# Patient Record
Sex: Female | Born: 1970 | Race: White | Hispanic: No | Marital: Married | Smoking: Former smoker
Health system: Southern US, Community
[De-identification: ages and names within clinical notes are randomized; demographics above are authoritative.]

## PROBLEM LIST (undated history)

## (undated) DIAGNOSIS — R471 Dysarthria and anarthria: Secondary | ICD-10-CM

## (undated) DIAGNOSIS — G8194 Hemiplegia, unspecified affecting left nondominant side: Secondary | ICD-10-CM

## (undated) DIAGNOSIS — F419 Anxiety disorder, unspecified: Secondary | ICD-10-CM

## (undated) DIAGNOSIS — J45909 Unspecified asthma, uncomplicated: Secondary | ICD-10-CM

## (undated) DIAGNOSIS — F32A Depression, unspecified: Secondary | ICD-10-CM

## (undated) DIAGNOSIS — E7800 Pure hypercholesterolemia, unspecified: Secondary | ICD-10-CM

## (undated) DIAGNOSIS — Z8673 Personal history of transient ischemic attack (TIA), and cerebral infarction without residual deficits: Secondary | ICD-10-CM

## (undated) DIAGNOSIS — G43909 Migraine, unspecified, not intractable, without status migrainosus: Secondary | ICD-10-CM

## (undated) DIAGNOSIS — F329 Major depressive disorder, single episode, unspecified: Secondary | ICD-10-CM

## (undated) DIAGNOSIS — I773 Arterial fibromuscular dysplasia: Secondary | ICD-10-CM

## (undated) DIAGNOSIS — R079 Chest pain, unspecified: Secondary | ICD-10-CM

## (undated) DIAGNOSIS — R739 Hyperglycemia, unspecified: Secondary | ICD-10-CM

## (undated) DIAGNOSIS — M2062 Acquired deformities of toe(s), unspecified, left foot: Secondary | ICD-10-CM

## (undated) HISTORY — DX: Major depressive disorder, single episode, unspecified: F32.9

## (undated) HISTORY — DX: Anxiety disorder, unspecified: F41.9

## (undated) HISTORY — DX: Migraine, unspecified, not intractable, without status migrainosus: G43.909

## (undated) HISTORY — DX: Pure hypercholesterolemia, unspecified: E78.00

## (undated) HISTORY — DX: Dysarthria and anarthria: R47.1

## (undated) HISTORY — DX: Depression, unspecified: F32.A

## (undated) HISTORY — PX: VAGINAL HYSTERECTOMY: SUR661

## (undated) HISTORY — PX: VENA CAVA FILTER PLACEMENT: SUR1032

## (undated) HISTORY — DX: Personal history of transient ischemic attack (TIA), and cerebral infarction without residual deficits: Z86.73

## (undated) HISTORY — PX: BREAST ENHANCEMENT SURGERY: SHX7

## (undated) HISTORY — PX: APPENDECTOMY: SHX54

---

## 2012-11-13 LAB — HM MAMMOGRAPHY: HM Mammogram: NORMAL

## 2012-11-13 LAB — HM DEXA SCAN

## 2013-02-21 LAB — HM PAP SMEAR: HM Pap smear: NORMAL

## 2013-10-09 ENCOUNTER — Ambulatory Visit: Payer: Medicaid - Out of State | Admitting: Neurology

## 2013-10-25 ENCOUNTER — Encounter: Payer: Self-pay | Admitting: Neurology

## 2013-10-29 ENCOUNTER — Encounter: Payer: Self-pay | Admitting: Neurology

## 2013-10-29 ENCOUNTER — Ambulatory Visit (INDEPENDENT_AMBULATORY_CARE_PROVIDER_SITE_OTHER): Payer: Medicare Other | Admitting: Neurology

## 2013-10-29 VITALS — BP 108/66 | HR 98 | Ht 61.0 in | Wt 170.0 lb

## 2013-10-29 DIAGNOSIS — F419 Anxiety disorder, unspecified: Secondary | ICD-10-CM

## 2013-10-29 DIAGNOSIS — E7800 Pure hypercholesterolemia, unspecified: Secondary | ICD-10-CM

## 2013-10-29 DIAGNOSIS — G8114 Spastic hemiplegia affecting left nondominant side: Secondary | ICD-10-CM | POA: Insufficient documentation

## 2013-10-29 DIAGNOSIS — I773 Arterial fibromuscular dysplasia: Secondary | ICD-10-CM

## 2013-10-29 DIAGNOSIS — I639 Cerebral infarction, unspecified: Secondary | ICD-10-CM

## 2013-10-29 DIAGNOSIS — G8110 Spastic hemiplegia affecting unspecified side: Secondary | ICD-10-CM

## 2013-10-29 DIAGNOSIS — F329 Major depressive disorder, single episode, unspecified: Secondary | ICD-10-CM | POA: Insufficient documentation

## 2013-10-29 DIAGNOSIS — G43909 Migraine, unspecified, not intractable, without status migrainosus: Secondary | ICD-10-CM | POA: Insufficient documentation

## 2013-10-29 DIAGNOSIS — I6350 Cerebral infarction due to unspecified occlusion or stenosis of unspecified cerebral artery: Secondary | ICD-10-CM

## 2013-10-29 DIAGNOSIS — I7789 Other specified disorders of arteries and arterioles: Secondary | ICD-10-CM

## 2013-10-29 DIAGNOSIS — R471 Dysarthria and anarthria: Secondary | ICD-10-CM | POA: Insufficient documentation

## 2013-10-29 DIAGNOSIS — F32A Depression, unspecified: Secondary | ICD-10-CM

## 2013-10-29 NOTE — Progress Notes (Signed)
PATIENT: Courtney Garza DOB: 1970-11-07  HISTORICAL  Courtney Garza is a 43 years old right-handed female, referred by her primary care physician Dr. Neena Rhymes for evaluation of EMG guided Botox injection for her spastic left upper and lower extremity.  She had a history of fibromuscular dystrophy, presented with right carotid artery dissection 2 years ago in 2013,, suffered stroke, with left hemiparesis, she also reported a history of right vertebral artery dissection, iliac artery dissection, she was treated by [place] neurologist [facility], she has been receiving EMG guided Botox every 3 months by Dr. Vito Berger, last injection was Jul 05 2011, received a total of 600 Botox a to her left upper extremity, and 800 units of Botox a to left lower extremity.  Per patient, Botox injection has been very helpful, less spastic, she has no use of her left hand, ambulate with a cane  He recently moved to [name] August 2015, also agreed to establish her regular EMG guided Botox injection.     REVIEW OF SYSTEMS: Full 14 system review of systems performed and notable only for gait difficulty ALLERGIES: Allergies  Allergen Reactions  . Acetaminophen Hives  . Hydromorphone Other (See Comments)    Could not speak or keep her eyes open  . Migraine Formula  [Aspirin-Acetaminophen-Caffeine] Hives    pt is able to eat shellfish without a problem.   . Ondansetron Nausea And Vomiting  . Duloxetine Hcl Rash    hallucinations    HOME MEDICATIONS: Current Outpatient Prescriptions on File Prior to Visit  Medication Sig Dispense Refill  . albuterol (PROVENTIL HFA;VENTOLIN HFA) 108 (90 BASE) MCG/ACT inhaler Inhale into the lungs every 6 (six) hours as needed for wheezing or shortness of breath.      Marland Kitchen amitriptyline (ELAVIL) 10 MG tablet Take 10 mg by mouth 2 (two) times daily.      Marland Kitchen aspirin 325 MG tablet Take 325 mg by mouth daily.      Marland Kitchen omeprazole (PRILOSEC) 20 MG capsule Take 20 mg by mouth  daily.      . promethazine (PHENERGAN) 25 MG tablet Take 25 mg by mouth every 6 (six) hours as needed for nausea or vomiting.       No current facility-administered medications on file prior to visit.    PAST MEDICAL HISTORY: Past Medical History  Diagnosis Date  . Migraine   . High cholesterol   . Depression   . Anxiety   . Dysarthria     Left arm    PAST SURGICAL HISTORY: Past Surgical History  Procedure Laterality Date  . Vaginal hysterectomy    . Appendectomy    . Breast enhancement surgery      FAMILY HISTORY: Family History  Problem Relation Age of Onset  . Cancer Mother     SOCIAL HISTORY:  History   Social History  . Marital Status: Married    Spouse Name: [name]     Number of Children: 3  . Years of Education: 10 th   Occupational History  .      Disabled   Social History Main Topics  . Smoking status: Former Games developer  . Smokeless tobacco: Never Used     Comment: Quit 2013  . Alcohol Use: No  . Drug Use: No  . Sexual Activity: Not on file   Other Topics Concern  . Not on file   Social History Narrative   Patient lives at home with her husband [name].   Disabled.   Education 10  th   Right handed.   Caffeine six cup of coffee daily.       PHYSICAL EXAM   Filed Vitals:   10/29/13 0908  BP: 108/66  Pulse: 98  Height:  (1.549 m)  Weight: 170 lb (77.111 kg)    Not recorded    Body mass index is 32.14 kg/(m^2).   Generalized: In no acute distress  Neck: Supple, no carotid bruits   Cardiac: Regular rate rhythm  Pulmonary: Clear to auscultation bilaterally  Musculoskeletal: No deformity  Neurological examination  Mentation: Alert oriented to time, place, history taking, and causual conversation  Cranial nerve II-XII: Pupils were equal round reactive to light. Extraocular movements were full.  Visual field were full on confrontational test. Bilateral fundi were sharp.  Facial sensation and strength were normal. Hearing was  intact to finger rubbing bilaterally. Uvula tongue midline.  Head turning and shoulder shrug and were normal and symmetric.Tongue protrusion into cheek strength was normal.  Motor: spastic left hemiparesis, no movement of left arm, left shoulder anterior rotation, adduction, mild left elbow flexion, pronation, left wrist flexion, thumb inin position, left hip flexion 4, knee flexion 4, extension 4 plus, ankle dorsiflexion 3, eversion 3, in portion 3, plantar flexion 4  Sensory: Intact to fine touch, pinprick, preserved vibratory sensation, and proprioception at toes.  Coordination: Normal finger to nose, heel-to-shin bilaterally there was no truncal ataxia  Gait: Rising up from seated position pushing on a chair arm, left foot drop, left  circumferential gait  Deep tendon reflexes: Brachioradialis 2/2, biceps 2/2, triceps 2/2, patellar 2/3, Achilles 2/2, left plantar responses was extensor, right side was flexor.  DIAGNOSTIC DATA (LABS, IMAGING, TESTING) - I reviewed patient records, labs, notes, testing and imaging myself where available.   ASSESSMENT AND PLAN  Ardena Gangl is a 43 y.o. female with past medical history of right hemisphere stroke in 2013 due to fibromuscular dysplasia, carotid artery dissection, with residual spastic left hemiparesis, she has been receiving EMG guided botulism toxin injection to her spastic left upper, and lower extremity, which did help her spasticity, last injection was May 20/15, large dose up to 1400 units in one injection   1. Preauthorization for Xeomin 400 2. RTC for EMG guided injection after preauthorization   Levert Feinstein, M.D. Ph.D.  [facility] [address] [place], [place] [address] [phone]

## 2013-10-30 LAB — VITAMIN B12: Vitamin B-12: 350 pg/mL (ref 211–946)

## 2013-10-30 LAB — TSH: TSH: 1.97 u[IU]/mL (ref 0.450–4.500)

## 2013-10-30 LAB — RPR: RPR: NONREACTIVE

## 2013-10-30 LAB — C-REACTIVE PROTEIN: CRP: 5.2 mg/L — ABNORMAL HIGH (ref 0.0–4.9)

## 2013-10-30 LAB — HOMOCYSTEINE: Homocysteine: 9.4 umol/L (ref 0.0–15.0)

## 2013-10-30 LAB — FOLATE: Folate: 9.2 ng/mL (ref >3.0)

## 2013-10-30 LAB — SEDIMENTATION RATE: Sed Rate: 7 mm/hr (ref 0–32)

## 2013-11-13 ENCOUNTER — Ambulatory Visit (INDEPENDENT_AMBULATORY_CARE_PROVIDER_SITE_OTHER): Payer: Medicare Other | Admitting: Family Medicine

## 2013-11-13 ENCOUNTER — Encounter: Payer: Self-pay | Admitting: Family Medicine

## 2013-11-13 VITALS — BP 110/80 | HR 111 | Temp 98.0°F | Resp 16 | Ht 62.0 in | Wt 171.5 lb

## 2013-11-13 DIAGNOSIS — G8114 Spastic hemiplegia affecting left nondominant side: Secondary | ICD-10-CM

## 2013-11-13 DIAGNOSIS — Z23 Encounter for immunization: Secondary | ICD-10-CM

## 2013-11-13 DIAGNOSIS — E7800 Pure hypercholesterolemia, unspecified: Secondary | ICD-10-CM

## 2013-11-13 DIAGNOSIS — F419 Anxiety disorder, unspecified: Secondary | ICD-10-CM

## 2013-11-13 DIAGNOSIS — R1012 Left upper quadrant pain: Secondary | ICD-10-CM

## 2013-11-13 DIAGNOSIS — G8110 Spastic hemiplegia affecting unspecified side: Secondary | ICD-10-CM

## 2013-11-13 DIAGNOSIS — I773 Arterial fibromuscular dysplasia: Secondary | ICD-10-CM

## 2013-11-13 DIAGNOSIS — J45909 Unspecified asthma, uncomplicated: Secondary | ICD-10-CM

## 2013-11-13 DIAGNOSIS — I7789 Other specified disorders of arteries and arterioles: Secondary | ICD-10-CM

## 2013-11-13 DIAGNOSIS — F411 Generalized anxiety disorder: Secondary | ICD-10-CM

## 2013-11-13 DIAGNOSIS — J4530 Mild persistent asthma, uncomplicated: Secondary | ICD-10-CM

## 2013-11-13 MED ORDER — PROMETHAZINE HCL 25 MG PO TABS: 25.0000 mg | ORAL_TABLET | Freq: Four times a day (QID) | ORAL | Status: DC | PRN

## 2013-11-13 MED ORDER — ALBUTEROL SULFATE HFA 108 (90 BASE) MCG/ACT IN AERS: 2.0000 | INHALATION_SPRAY | Freq: Four times a day (QID) | RESPIRATORY_TRACT | Status: DC | PRN

## 2013-11-13 MED ORDER — VENLAFAXINE HCL ER 150 MG PO CP24: 150.0000 mg | ORAL_CAPSULE | Freq: Every day | ORAL | Status: DC

## 2013-11-13 MED ORDER — DICYCLOMINE HCL 20 MG PO TABS: 20.0000 mg | ORAL_TABLET | Freq: Four times a day (QID) | ORAL | Status: DC

## 2013-11-13 NOTE — Progress Notes (Signed)
   Subjective:    Patient ID: Courtney Garza, female    DOB: November 17, 1970, 43 y.o.   MRN: [id number]  HPI New to establish.  Previous MD- [facility]  Neuro- [facility] Arabia  L hemiparesis- s/p CVA 11/01/10 due to dissection of R carotid along w/ dissections of L vertebral, celiac, iliac due to fibromuscular dysplasia.  Pt was previously seeing vascular specialist.  Pt plans to follow at [facility] w/ Dr Lollie Sails.  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  Hyperlipidemia- chronic problem, on Lipitor.  Total cholesterol 154, LDL 70, Trigs 137 on 08/21/13.  Hx of chronic LUQ abd pain- has had negative gastric emptying study, normal GYN exam.  Pain is worse after eating, particularly red meat.  Anxiety- chronic problem, well controlled on Effexor.  Husband reports pt will 'space' particularly in the evening- 'it's like my thoughts are spinning'.  Quick to frustrate and anger.  Increased agitation.  Asthma- 'as long as i can remember'.  Previously on Flovent.  Pt reports requiring inhaler 'a couple times a month'.   Review of Systems For ROS see HPI     Objective:   Physical Exam  Vitals reviewed. Constitutional: She is oriented to person, place, and time. She appears well-developed and well-nourished. No distress.  HENT:  Head: Normocephalic and atraumatic.  Eyes: Conjunctivae and EOM are normal. Pupils are equal, round, and reactive to light.  Neck: Normal range of motion. Neck supple. No thyromegaly present.  Cardiovascular: Normal rate, regular rhythm, normal heart sounds and intact distal pulses.   No murmur heard. Pulmonary/Chest: Effort normal and breath sounds normal. No respiratory distress.  Abdominal: Soft. She exhibits no distension. There is no tenderness.  Musculoskeletal: She exhibits no edema.  Lymphadenopathy:    She has no cervical adenopathy.  Neurological: She is alert and oriented to person, place, and time. Coordination abnormal.  Pt w/ L spastic hemiplegia    Skin: Skin is warm and dry.  Psychiatric: She has a normal mood and affect. Her behavior is normal.          Assessment & Plan:

## 2013-11-13 NOTE — Patient Instructions (Signed)
Follow up in 6 months to recheck cholesterol We'll call you with your vascular and GI referrals Increase the Effexor to  daily- 2 of what you have, 1 of the new prescription Start the Bentyl as needed for abdominal pain Call with any questions or concerns Welcome!  We're glad to have you!!

## 2013-11-13 NOTE — Progress Notes (Signed)
Pre visit review using our clinic review tool, if applicable. No additional management support is needed unless otherwise documented below in the visit note. 

## 2013-11-14 DIAGNOSIS — Z23 Encounter for immunization: Secondary | ICD-10-CM

## 2013-11-17 NOTE — Assessment & Plan Note (Signed)
New to provider.  S/p CVA.  Following w/ Neuro.  Will follow along.

## 2013-11-17 NOTE — Assessment & Plan Note (Signed)
New to provider.  Ongoing for pt.  Reports she has had extensive GI workup but no cause of pain identified.  May be related to pt's vascular issues putting pressure on nerve.  Will refer to GI specialist at [facility].  Pt expressed understanding and is in agreement w/ plan.

## 2013-11-17 NOTE — Assessment & Plan Note (Signed)
Chronic problem.  Tolerating statin w/o difficulty.  Reviewed recent labs.  No med changes at this time 

## 2013-11-17 NOTE — Assessment & Plan Note (Signed)
New to provider, ongoing for pt.  Will increased Effexor for better control of sxs as both pt and husband agree that this is still large problem for pt.  Will monitor for improvement.

## 2013-11-17 NOTE — Assessment & Plan Note (Signed)
New to provider, ongoing for pt.  Pt reports she has not required controller inhaler and only uses albuterol 'a couple of times a month'.  Will continue to follow and start controller med prn.

## 2013-11-17 NOTE — Assessment & Plan Note (Signed)
New to provider, ongoing for pt.  This has caused multiple dissections and resulted in pt's CVA.  Pt was recommended to f/u w/ specialist at [facility].  Referral entered.

## 2013-11-19 ENCOUNTER — Encounter: Payer: Self-pay | Admitting: Family Medicine

## 2013-11-19 MED ORDER — ATORVASTATIN CALCIUM 20 MG PO TABS: 20.0000 mg | ORAL_TABLET | Freq: Every day | ORAL | Status: DC

## 2013-11-19 NOTE — Telephone Encounter (Signed)
Med filled.  

## 2013-11-27 ENCOUNTER — Encounter: Payer: Self-pay | Admitting: Neurology

## 2013-11-27 ENCOUNTER — Ambulatory Visit (INDEPENDENT_AMBULATORY_CARE_PROVIDER_SITE_OTHER): Payer: Medicare Other | Admitting: Neurology

## 2013-11-27 DIAGNOSIS — G8114 Spastic hemiplegia affecting left nondominant side: Secondary | ICD-10-CM

## 2013-11-27 DIAGNOSIS — G43011 Migraine without aura, intractable, with status migrainosus: Secondary | ICD-10-CM

## 2013-11-27 MED ORDER — INCOBOTULINUMTOXINA 100 UNITS IM SOLR
400.0000 [IU] | Freq: Once | INTRAMUSCULAR | Status: AC
  Administered 2013-11-27: 400 [IU] via INTRAMUSCULAR

## 2013-11-27 NOTE — Progress Notes (Signed)
PATIENT: Courtney Garza DOB: 01/25/1971  HISTORICAL  Courtney Garza is a 43 years old right-handed female, referred by her primary care physician Dr. Neena Rhymes for evaluation of EMG guided Botox injection for her spastic left upper and lower extremity.  She had a history of fibromuscular dystrophy, presented with right carotid artery dissection 2 years ago in 2013,, suffered stroke, with left hemiparesis, she also reported a history of left vertebral artery dissection, iliac artery dissection, she was treated by [place] neurologist [facility], she has been receiving EMG guided Botox every 3 months by Dr. Vito Berger, last injection was Jul 05 2011, received a total of 600 Botox a to her left upper extremity, and 800 units of Botox a to left lower extremity.  Per patient, Botox injection has been very helpful, less spastic, she has no use of her left hand, ambulate with a cane  He recently moved to [facility] August 2015, also agreed to establish her regular EMG guided Botox injection.    UPDATE Oct 7th 2015  Patient came in for EMG guided xeomin injections her spastic left hemiparesis,  She also reported long-standing history of migraine, getting worse over the past few years, almost daily basis, her headache I usually right retro-orbital area severe pounding headaches, with associated light noise sensitivity, lasting for a few hours, previously she was also receiving Botox injection for migraine prevention, which has worked well for her  REVIEW OF SYSTEMS: Full 14 system review of systems performed and notable only for gait difficulty ALLERGIES: Allergies  Allergen Reactions  . Acetaminophen Hives  . Hydromorphone Other (See Comments)    Could not speak or keep her eyes open  . Migraine Formula  [Aspirin-Acetaminophen-Caffeine] Hives    pt is able to eat shellfish without a problem.   . Ondansetron Nausea And Vomiting  . Shellfish-Derived Products   . Duloxetine Hcl Rash   hallucinations    HOME MEDICATIONS: Current Outpatient Prescriptions on File Prior to Visit  Medication Sig Dispense Refill  . albuterol (PROVENTIL HFA;VENTOLIN HFA) 108 (90 BASE) MCG/ACT inhaler Inhale 2 puffs into the lungs every 6 (six) hours as needed for wheezing or shortness of breath.  1 Inhaler  6  . amitriptyline (ELAVIL) 10 MG tablet Take 10 mg by mouth 2 (two) times daily.      . Ascorbic Acid (VITAMIN C) 1000 MG tablet Take 1,000 mg by mouth daily.      Marland Kitchen aspirin 325 MG tablet Take 325 mg by mouth daily.      Marland Kitchen atorvastatin (LIPITOR) 20 MG tablet Take 1 tablet (20 mg total) by mouth daily.  30 tablet  6  . baclofen (LIORESAL) 10 MG tablet Take 10 mg by mouth 3 (three) times daily.      . cyproheptadine (PERIACTIN) 4 MG tablet Take 4 mg by mouth 3 (three) times daily as needed for allergies.      Marland Kitchen dicyclomine (BENTYL) 20 MG tablet Take 1 tablet (20 mg total) by mouth every 6 (six) hours.  60 tablet  3  . gabapentin (NEURONTIN) 300 MG capsule Take 300 mg by mouth 3 (three) times daily.      Marland Kitchen gabapentin (NEURONTIN) 600 MG tablet Take 600 mg by mouth 3 (three) times daily.      . Multiple Vitamins-Minerals (MULTIVITAMIN & MINERAL PO) Take by mouth daily.      Marland Kitchen omeprazole (PRILOSEC) 20 MG capsule Take 20 mg by mouth daily.      . promethazine (PHENERGAN) 25 MG  tablet Take 1 tablet (25 mg total) by mouth every 6 (six) hours as needed for nausea or vomiting.  30 tablet  3  . venlafaxine XR (EFFEXOR-XR) 150 MG 24 hr capsule Take 1 capsule (150 mg total) by mouth daily with breakfast.  30 capsule  6   No current facility-administered medications on file prior to visit.    PAST MEDICAL HISTORY: Past Medical History  Diagnosis Date  . Migraine   . High cholesterol   . Depression   . Anxiety   . Dysarthria     Left arm    PAST SURGICAL HISTORY: Past Surgical History  Procedure Laterality Date  . Vaginal hysterectomy    . Appendectomy    . Breast enhancement surgery       FAMILY HISTORY: Family History  Problem Relation Age of Onset  . Cancer Mother     SOCIAL HISTORY:  History   Social History  . Marital Status: Married    Spouse Name: [name]     Number of Children: 3  . Years of Education: 10 th   Occupational History  .      Disabled   Social History Main Topics  . Smoking status: Former Games developer  . Smokeless tobacco: Never Used     Comment: Quit 2013  . Alcohol Use: No  . Drug Use: No  . Sexual Activity: Not on file   Other Topics Concern  . Not on file   Social History Narrative   Patient lives at home with her husband [name].   Disabled.   Education 10 th   Right handed.   Caffeine six cup of coffee daily.       PHYSICAL EXAM   Filed Vitals:    Not recorded    Cannot calculate BMI with a height equal to zero.   Generalized: In no acute distress  Neck: Supple, no carotid bruits   Cardiac: Regular rate rhythm  Pulmonary: Clear to auscultation bilaterally  Musculoskeletal: No deformity  Neurological examination  Mentation: Alert oriented to time, place, history taking, and causual conversation  Cranial nerve II-XII: Pupils were equal round reactive to light. Extraocular movements were full.  Visual field were full on confrontational test. Bilateral fundi were sharp.  Facial sensation and strength were normal. Hearing was intact to finger rubbing bilaterally. Uvula tongue midline.  Head turning and shoulder shrug and were normal and symmetric.Tongue protrusion into cheek strength was normal.  Motor: spastic left hemiparesis, no movement of left arm, left shoulder anterior rotation, adduction, mild left elbow flexion, pronation, left wrist flexion, thumb inin position, left hip flexion 4, knee flexion 4, extension 4 plus, ankle dorsiflexion 3, eversion 3, in portion 3, plantar flexion 4  Sensory: Intact to fine touch, pinprick, preserved vibratory sensation, and proprioception at toes.  Coordination: Normal  finger to nose, heel-to-shin bilaterally there was no truncal ataxia  Gait: Rising up from seated position pushing on a chair arm, left foot drop, left  circumferential gait  Deep tendon reflexes: Brachioradialis 2/2, biceps 2/2, triceps 2/2, patellar 2/3, Achilles 2/2, left plantar responses was extensor, right side was flexor.  DIAGNOSTIC DATA (LABS, IMAGING, TESTING) - I reviewed patient records, labs, notes, testing and imaging myself where available.   ASSESSMENT AND PLAN  Courtney Garza is a 43 y.o. female with past medical history of right hemisphere stroke in 2013 due to fibromuscular dysplasia, carotid artery dissection, with residual spastic left hemiparesis, she has been receiving EMG guided botulism toxin injection to  her spastic left upper, and lower extremity, which did help her spasticity, last injection was May 20/15, large dose up to 1400 units in one injection   We have discussed about her migraine treatment, she is not having migraine almost daily basis, is not a candidate for treatment in treatment because of her history of carotid artery dissection.  1. Will proceed with xeomin injection for her left hemiparesis today, under electrical stimulation guidance, 400 units of xeomin was used  Left pronator teres 50 units Left biceps 75 Left brachialis 75 Left flexor carpi ulnaris 25 Left flexor carpi radialis 25  Left flexor digitorum profundus 50 Left flexor digitorum superficialis 25 Left palmaris longus 25 Left adductor pollicis brevis 25  Left hand lumbricals 25 divided into 4 injections  I will also start Topamax 100 mg twice a day as headache prevention, Ambien as needed for migraine abortion, preauthorization for Botox for migraine, and her left spastic hemiparesis  Courtney FeinsteinYijun Sandy Blouch, M.D. Ph.D.  [facility] [address], [address] [place], [place] [address] [phone]

## 2013-11-28 ENCOUNTER — Telehealth: Payer: Self-pay | Admitting: Family Medicine

## 2013-11-28 ENCOUNTER — Telehealth: Payer: Self-pay | Admitting: Neurology

## 2013-11-28 DIAGNOSIS — G43809 Other migraine, not intractable, without status migrainosus: Secondary | ICD-10-CM

## 2013-11-28 DIAGNOSIS — I639 Cerebral infarction, unspecified: Secondary | ICD-10-CM

## 2013-11-28 DIAGNOSIS — G8114 Spastic hemiplegia affecting left nondominant side: Secondary | ICD-10-CM

## 2013-11-28 NOTE — Telephone Encounter (Signed)
Patient calling to state that the scripts for Topamax and another medication that she doesn't remember was not called into her [facility] on [address], states that she really needs something since she is having bad headaches. Please return call and advise.

## 2013-11-28 NOTE — Telephone Encounter (Signed)
Note from yesterday says: I will also start Topamax 100 mg twice a day as headache prevention, Ambien as needed for migraine abortion  Dr Terrace ArabiaYan, I will be happy to send Rx's.  Could you please verify what strength of Ambien you would like to prescribe.  As well, would you like the patient to taper Topamax dose?  Thank you.

## 2013-11-28 NOTE — Telephone Encounter (Signed)
Patient's spouse calling in to complain that patient was seen by Dr Terrace ArabiaYan yesterday for Botox injections, he states that he asked why she was not getting injection in her head, and was told that the medication was not ordered for Botox for her Migraines, patient was treated for Spastic Hemiplegia yesterday. States [place] injections for both. Also medication that she prescribed at visit Topamax 100 mg and Ambien was never called to the pharmacy. Patient uses [facility] on [address] [place]. Shanda BumpsJessica please check to see if medications was sent into patient's pharmacy.

## 2013-11-28 NOTE — Telephone Encounter (Signed)
Previous portion of this note was sent to MD to verify Ambien dose (5 or 10mg ) and if Topamax needs to be titrated.  I will be happy to send Rx's once info has been clarified.

## 2013-11-28 NOTE — Telephone Encounter (Signed)
Spoke with pt husband, referral was placed for Dr. Arbutus Leasat.

## 2013-11-28 NOTE — Telephone Encounter (Signed)
Caller name: greg Relation to pt: husbnd Call back number:[phone]   Reason for call:  Returning call. Inquiring about referral.

## 2013-11-29 ENCOUNTER — Telehealth: Payer: Self-pay

## 2013-11-29 MED ORDER — DICLOFENAC POTASSIUM(MIGRAINE) 50 MG PO PACK: 50.0000 mg | PACK | ORAL | Status: DC | PRN

## 2013-11-29 MED ORDER — TOPIRAMATE 100 MG PO TABS: 100.0000 mg | ORAL_TABLET | Freq: Two times a day (BID) | ORAL | Status: DC

## 2013-11-29 NOTE — Telephone Encounter (Signed)
[facility]([employer]) notified us they have approved our request for coverage on [place] effective until 02/21/2015 Ref # [id number]

## 2013-11-29 NOTE — Telephone Encounter (Signed)
I called the patient back to advise.  She is aware Rx's have been sent and will pick them up later today.  Will call back if anything further is needed.

## 2013-11-29 NOTE — Telephone Encounter (Signed)
Patient's husband calling again and was very upset, states that patient's wife is having bad headaches and she is still without her medicine. Please return call and advise, husband is VERY upset.

## 2013-11-29 NOTE — Telephone Encounter (Signed)
Courtney Garza, please call patient, I have ERX topamax 100mg  bid and [facility]

## 2013-12-03 ENCOUNTER — Ambulatory Visit (INDEPENDENT_AMBULATORY_CARE_PROVIDER_SITE_OTHER): Payer: Medicare Other | Admitting: Family Medicine

## 2013-12-03 ENCOUNTER — Encounter: Payer: Self-pay | Admitting: Family Medicine

## 2013-12-03 ENCOUNTER — Other Ambulatory Visit: Payer: Self-pay | Admitting: Family Medicine

## 2013-12-03 VITALS — BP 112/80 | HR 91 | Temp 98.1°F | Resp 16 | Wt 171.2 lb

## 2013-12-03 DIAGNOSIS — J0110 Acute frontal sinusitis, unspecified: Secondary | ICD-10-CM

## 2013-12-03 DIAGNOSIS — G43009 Migraine without aura, not intractable, without status migrainosus: Secondary | ICD-10-CM

## 2013-12-03 MED ORDER — AMOXICILLIN 875 MG PO TABS: 875.0000 mg | ORAL_TABLET | Freq: Two times a day (BID) | ORAL | Status: DC

## 2013-12-03 MED ORDER — OXYCODONE HCL 5 MG PO TABS: 5.0000 mg | ORAL_TABLET | ORAL | Status: DC | PRN

## 2013-12-03 NOTE — Assessment & Plan Note (Signed)
Pt has appt w/ new neurologist upcoming.  Was intolerant to Topamax- vomiting.  Pt unable to take tylenol due to hives.  Unable to take tramadol due to increased risk of seizures when taken w/ Elavil.  Unable to take triptan due to increased risk of serotonin syndrome.  Will start low dose oxycodone prn.  Reviewed supportive care and red flags that should prompt return.  Pt expressed understanding and is in agreement w/ plan.

## 2013-12-03 NOTE — Telephone Encounter (Signed)
Will not do Tramadol due to increased risk of seizures.  Due to pt's multiple allergies will discuss at OV today.

## 2013-12-03 NOTE — Telephone Encounter (Signed)
Pt has an intolerance to hydrocodone- excessive sedation- but could try Tramadol as needed for pain (TID prn, #45, no refills)  Let me know if med warning pops up

## 2013-12-03 NOTE — Patient Instructions (Signed)
Follow up as needed Start the Amoxicillin twice daily (take w/ food) for the sinus infection Drink plenty of fluids REST! Use the Oxycodone as needed for migraines Call with any questions or concerns Hang in there!!!

## 2013-12-03 NOTE — Telephone Encounter (Signed)
Pt is wanting to know if dr. Beverely Lowtabori has any suggestions on what she can take for her headaches until she can see the neurologist in Nov.

## 2013-12-03 NOTE — Progress Notes (Signed)
   Subjective:    Patient ID: Courtney Garza, female    DOB: 08/04/1970, 43 y.o.   MRN: [id number]  HPI Sore throat- sxs started ~1 week ago.  No fevers.  + hoarseness.  Minimal cough.  Denies nasal congestion.  Mild R ear pain.  No sinus pain/pressure.  No PND.  No known sick contacts.  No nausea other than when she attempted Topamax.  HA- pt w/ hx of migraines.  Has appt upcoming w/ new neurologist.  Looking for pain relief until then.   Review of Systems For ROS see HPI     Objective:   Physical Exam  Vitals reviewed. Constitutional: She appears well-developed and well-nourished. No distress.  HENT:  Head: Normocephalic and atraumatic.  Right Ear: A middle ear effusion is present.  Left Ear: Tympanic membrane normal.  Nose: Mucosal edema and rhinorrhea present. Right sinus exhibits frontal sinus tenderness. Right sinus exhibits no maxillary sinus tenderness. Left sinus exhibits frontal sinus tenderness. Left sinus exhibits no maxillary sinus tenderness.  Mouth/Throat: Uvula is midline and mucous membranes are normal. Posterior oropharyngeal erythema present. No oropharyngeal exudate.  Eyes: Conjunctivae and EOM are normal. Pupils are equal, round, and reactive to light.  Neck: Normal range of motion. Neck supple.  Cardiovascular: Normal rate, regular rhythm and normal heart sounds.   Pulmonary/Chest: Effort normal and breath sounds normal. No respiratory distress. She has no wheezes.  Lymphadenopathy:    She has no cervical adenopathy.          Assessment & Plan:

## 2013-12-03 NOTE — Telephone Encounter (Signed)
Yes allergy warning is present. Left for you to see

## 2013-12-03 NOTE — Assessment & Plan Note (Signed)
New.  Pt's sxs and PE consistent w/ infxn.  Start abx.  Reviewed supportive care and red flags that should prompt return.  Pt expressed understanding and is in agreement w/ plan.  

## 2013-12-03 NOTE — Progress Notes (Signed)
Pre visit review using our clinic review tool, if applicable. No additional management support is needed unless otherwise documented below in the visit note. 

## 2013-12-19 ENCOUNTER — Ambulatory Visit: Payer: Medicare Other | Admitting: Neurology

## 2013-12-24 ENCOUNTER — Telehealth: Payer: Self-pay | Admitting: Family Medicine

## 2013-12-24 MED ORDER — OXYCODONE HCL 5 MG PO TABS: 5.0000 mg | ORAL_TABLET | ORAL | Status: DC | PRN

## 2013-12-24 NOTE — Telephone Encounter (Signed)
Med filled and pt notified.  

## 2013-12-24 NOTE — Addendum Note (Signed)
Addended by: [name] LatinoYLER, JESSICA L on: 12/24/2013 10:05 AM   Modules accepted: Orders

## 2013-12-24 NOTE — Telephone Encounter (Signed)
Ok for #30 

## 2013-12-24 NOTE — Telephone Encounter (Signed)
Caller name:Pinales, Jamilyn Relation to EA:VWUJpt:self Call back number:[phone] Pharmacy: Reason for call: pt is needing new rxoxyCODONE (OXY IR/ROXICODONE) 5 MG immediate release tablet Please call when available for pick up

## 2013-12-24 NOTE — Telephone Encounter (Signed)
Last OV 12/03/13 Oxy  last filled 12/03/13 #30 with 0

## 2014-01-03 ENCOUNTER — Other Ambulatory Visit: Payer: Self-pay | Admitting: Family Medicine

## 2014-01-03 NOTE — Telephone Encounter (Signed)
Last OV 12/03/13 Periactin never filled by our office. Ok to fill?

## 2014-01-03 NOTE — Telephone Encounter (Signed)
Med filled.  

## 2014-01-09 ENCOUNTER — Ambulatory Visit: Payer: Medicare Other | Admitting: Neurology

## 2014-01-15 ENCOUNTER — Ambulatory Visit (INDEPENDENT_AMBULATORY_CARE_PROVIDER_SITE_OTHER): Payer: Medicare Other | Admitting: Neurology

## 2014-01-15 ENCOUNTER — Encounter: Payer: Self-pay | Admitting: Neurology

## 2014-01-15 VITALS — BP 112/68 | HR 72 | Ht 61.0 in | Wt 174.0 lb

## 2014-01-15 DIAGNOSIS — I63031 Cerebral infarction due to thrombosis of right carotid artery: Secondary | ICD-10-CM

## 2014-01-15 DIAGNOSIS — G43719 Chronic migraine without aura, intractable, without status migrainosus: Secondary | ICD-10-CM

## 2014-01-15 DIAGNOSIS — G7102 Facioscapulohumeral muscular dystrophy: Secondary | ICD-10-CM

## 2014-01-15 DIAGNOSIS — G8114 Spastic hemiplegia affecting left nondominant side: Secondary | ICD-10-CM

## 2014-01-15 DIAGNOSIS — G8110 Spastic hemiplegia affecting unspecified side: Secondary | ICD-10-CM

## 2014-01-15 DIAGNOSIS — G710 Muscular dystrophy: Secondary | ICD-10-CM

## 2014-01-15 MED ORDER — DICLOFENAC POTASSIUM(MIGRAINE) 50 MG PO PACK: PACK | ORAL | Status: DC

## 2014-01-15 MED ORDER — AMITRIPTYLINE HCL 25 MG PO TABS: 50.0000 mg | ORAL_TABLET | Freq: Every day | ORAL | Status: DC

## 2014-01-15 NOTE — Progress Notes (Signed)
NEUROLOGY CONSULTATION NOTE  Courtney Downerabitha Dipiero MRN: [id number] DOB: 08-05-1970  Referring provider: Dr. Beverely Lowabori Primary care provider: Dr. 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  Records and labs reviewed.  History also obtained by her husband.  [place].  [facility] where she was receiving Botox for her migraines and spasticity.  The Botox helped the migraines.  They still occurred daily but were less intense (4/10) and did not have the accompanying symptoms.  Her last round of Botox was in May. Onset:  All her life, however worse since her stroke in 2012 Location:  Right frontal Quality:  aching Intensity:  7-8/10 Aura:  Occasionally "disturbed vision" (cannot focus) Prodrome:  no Associated symptoms:  Nausea, vomiting, photophobia, phonophobia, osmophobia, runny nose Duration:  constant Frequency:  constant Triggers/exacerbating factors:  fish Relieving factors:  none Activity:  Can't function 20 days out of the month.  Past abortive therapy:  Advil/Aleve ineffective. Past preventative therapy:  topiramate (vomiting), Cymbalta (side effects), Depakote (was only on it briefly, started in hospital but quickly changed as outpatient), Botox (effective).  Contraindications:  Acetaminophen/Excedrin (hives), triptans (FMD)   Current abortive therapy:  oxycodone, cyproheptadine Current preventative therapy:  Amitriptyline 20mg  (depression), venlafaxine ER 150mg  (depression), gabapentin (neuralgia) Other medications: baclofen 10mg    Caffeine:  6 cups coffee daily Alcohol:  no Smoker:  former Diet:  healthy Exercise:  Walks daily Depression/stress:  stable Sleep  hygiene:  good Family history of headache:  Dad  She had a right hemispheric stroke related to a carotid dissection due to fibromuscular dysplasia in 2012.  She has residual left spastic hemiparesis.  She was bedridden for two months and developed a DVT, requiring an IVC filter.  She reportedly was found to have protein C and S deficiency.  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  She has [facility] with [facility] for her FMD.  She takes ASA 325mg  daily.  10/29/13 LABS:  B12 350, RPR nonreactive, folate 9.2, TSH 1.970, CRP 5.2, Sed Rate 7  PAST MEDICAL HISTORY: Past Medical History  Diagnosis Date  . Migraine   . High cholesterol   . Depression   . Anxiety   . Dysarthria     Left arm  . History of stroke     PAST SURGICAL HISTORY: Past Surgical History  Procedure Laterality Date  . Vaginal hysterectomy    . Appendectomy    . Breast enhancement surgery    . Vena cava filter placement      and removal    MEDICATIONS: Current Outpatient Prescriptions on File Prior to Visit  Medication Sig Dispense Refill  . albuterol (PROVENTIL HFA;VENTOLIN HFA) 108 (90 BASE) MCG/ACT inhaler Inhale 2 puffs into the lungs every 6 (six) hours as needed for wheezing or shortness of breath. 1 Inhaler 6  . Ascorbic Acid (VITAMIN C) 1000 MG tablet Take 1,000 mg by mouth daily.    Marland Kitchen. aspirin 325 MG tablet Take 325 mg by mouth daily.    Marland Kitchen. atorvastatin (LIPITOR) 20 MG tablet Take 1 tablet (  20 mg total) by mouth daily. 30 tablet 6  . baclofen (LIORESAL) 10 MG tablet Take 10 mg by mouth 3 (three) times daily. 1 in AM, 1 in afternoon, 2 in evening    . cyproheptadine (PERIACTIN) 4 MG tablet TAKE 1 TABLET BY MOUTH THREE TIMES DAILY AS NEEDED 30 tablet 0  . dicyclomine (BENTYL) 20 MG tablet Take 1 tablet (20 mg total) by mouth every 6 (six)  hours. 60 tablet 3  . gabapentin (NEURONTIN) 300 MG capsule Take 300 mg by mouth 2 (two) times daily. Morning, afternoon    . gabapentin (NEURONTIN) 600 MG tablet Take 600 mg by mouth at bedtime.     . Multiple Vitamins-Minerals (MULTIVITAMIN & MINERAL PO) Take by mouth daily.    . omeprazole (PRILOSEC) 20 MG capsulMarland Kitchene Take 20 mg by mouth daily.    Marland Kitchen. oxyCODONE (OXY IR/ROXICODONE) 5 MG immediate release tablet Take 1 tablet (5 mg total) by mouth every 4 (four) hours as needed for severe pain. 30 tablet 0  . promethazine (PHENERGAN) 25 MG tablet Take 1 tablet (25 mg total) by mouth every 6 (six) hours as needed for nausea or vomiting. 30 tablet 3  . venlafaxine XR (EFFEXOR-XR) 150 MG 24 hr capsule Take 1 capsule (150 mg total) by mouth daily with breakfast. 30 capsule 6   No current facility-administered medications on file prior to visit.    ALLERGIES: Allergies  Allergen Reactions  . Acetaminophen Hives  . Hydromorphone Other (See Comments)    Could not speak or keep her eyes open  . Migraine Formula  [Aspirin-Acetaminophen-Caffeine] Hives    pt is able to eat shellfish without a problem.   . Ondansetron Nausea And Vomiting  . Shellfish-Derived Products     NOT SHELLFISH- all other fish  . Duloxetine Hcl Rash    hallucinations    FAMILY HISTORY: Family History  Problem Relation Age of Onset  . Cancer Mother     SOCIAL HISTORY: History   Social History  . Marital Status: Married    Spouse Name: Tammy SoursGreg     Number of Children: 3  . Years of Education: 10 th   Occupational History  .      Disabled   Social History Main Topics  . Smoking status: Former Smoker    Quit date: 02/22/2011  . Smokeless tobacco: Never Used     Comment: Quit 2013  . Alcohol Use: No  . Drug Use: Yes    Special: Marijuana  . Sexual Activity: Not on file   Other Topics Concern  . Not on file   Social History Narrative   Patient lives at home with her husband Earl LitesGregory.   Disabled.   Education  10 th   Right handed.   Caffeine six cup of coffee daily.      REVIEW OF SYSTEMS: Constitutional: No fevers, chills, or sweats, no generalized fatigue, change in appetite Eyes: No visual changes, double vision, eye pain Ear, nose and throat: No hearing loss, ear pain, nasal congestion, sore throat Cardiovascular: No chest pain, palpitations Respiratory:  No shortness of breath at rest or with exertion, wheezes GastrointestinaI: No nausea, vomiting, diarrhea, abdominal pain, fecal incontinence Genitourinary:  No dysuria, urinary retention or frequency Musculoskeletal:  Neck pain Integumentary: No rash, pruritus, skin lesions Neurological: as above Psychiatric: No depression, insomnia, anxiety Endocrine: No palpitations, fatigue, diaphoresis, mood swings, change in appetite, change in weight, increased thirst Hematologic/Lymphatic:  No anemia, purpura, petechiae. Allergic/Immunologic: no itchy/runny eyes, nasal congestion,  recent allergic reactions, rashes  PHYSICAL EXAM: Filed Vitals:   01/15/14 0739  BP: 112/68  Pulse: 72   General: No acute distress Head:  Normocephalic/atraumatic Eyes:  fundi unremarkable, without vessel changes, exudates, hemorrhages or papilledema. Neck: supple, no paraspinal tenderness, full range of motion Back: No paraspinal tenderness Heart: regular rate and rhythm Lungs: Clear to auscultation bilaterally. Vascular: No carotid bruits. Neurological Exam: Mental status: alert and oriented to person, place, and time, recent and remote memory intact, fund of knowledge intact, attention and concentration intact, speech fluent and not dysarthric, language intact. Cranial nerves: CN I: not tested CN II: pupils equal, round and sluggishly reactive to light, bilateral peripheral vision loss, fundi unremarkable, without vessel changes, exudates, hemorrhages or papilledema. CN III, IV, VI:  full range of motion, no nystagmus, no ptosis CN V: facial sensation  intact CN VII: left lower facial weakness CN VIII: hearing intact CN IX, X: gag intact, uvula midline CN XI: left shoulder shrug weakness CN XII: tongue midline Bulk & Tone: Left upper extremity spasticity, left lower extremity increased tone Motor:  Left upper extremity plegia, left quad 5-/5, 2/5 left foot Sensation:  Pinprick and vibration intact. Deep Tendon Reflexes:  2+ on the right, 3+ on the left, left Babinski Finger to nose testing:  No dysmetria on the right Gait:  Left circumduction. Romberg negative.  IMPRESSION: Chronic migraine without aura (sometimes with vague visual disturbance) Fibromuscular dysplasia History of right hemispheric infarction secondary to carotid dissection with residual left spastic hemiparesis  PLAN: Given her chronicity of migraines, daily for 3 years, having either failed preventative medications or have contraindication to medications, she meets criteria for Botox. 1.  Will set up for Botox (for both migraine and spastic hemiparesis) 2.  In the meantime, will increase amitriptyline to 50mg  3.  Will prescribe Cambia to try for abortive therapy 4.  Will obtain notes from [facility] 5.  I also asked that they bring a disc of images. 6.  Follow up for FMD with Dr. Shawnee Knapp 7.  Follow up in 3 months or sooner for Botox  Thank you for allowing me to take part in the care of this patient.  Shon Millet, DO  CC:  Neena Rhymes, MD

## 2014-01-15 NOTE — Patient Instructions (Addendum)
1.  Increase amitriptyline to 50mg  at bedime 2.  Try cambia powder.  Take 1 packet at earliest onset of headache.  Take with food if GI upset.  No more than once in 24 hours 3.  Set up for botox for migraine with me 4.  Set up for botox for spastic hemiplegia with Dr. Arbutus Leasat or Dr. Allena KatzPatel 5.  Follow up in 3 months.

## 2014-01-23 ENCOUNTER — Telehealth: Payer: Self-pay | Admitting: Neurology

## 2014-01-23 ENCOUNTER — Encounter: Payer: Self-pay | Admitting: *Deleted

## 2014-01-23 NOTE — Telephone Encounter (Signed)
Pt called requesting to speak to Susie again regarding her script and insurance. [phone]

## 2014-02-03 ENCOUNTER — Telehealth: Payer: Self-pay | Admitting: Neurology

## 2014-02-03 NOTE — Telephone Encounter (Signed)
Pt resch appt from 02-10-14 to 02-24-14

## 2014-02-04 ENCOUNTER — Encounter: Payer: Self-pay | Admitting: Family Medicine

## 2014-02-05 ENCOUNTER — Ambulatory Visit (INDEPENDENT_AMBULATORY_CARE_PROVIDER_SITE_OTHER): Payer: Medicare Other | Admitting: Physician Assistant

## 2014-02-05 ENCOUNTER — Encounter: Payer: Self-pay | Admitting: Physician Assistant

## 2014-02-05 VITALS — BP 118/68 | HR 105 | Temp 98.1°F | Resp 16 | Ht 61.0 in | Wt 172.2 lb

## 2014-02-05 DIAGNOSIS — H6990 Unspecified Eustachian tube disorder, unspecified ear: Secondary | ICD-10-CM | POA: Insufficient documentation

## 2014-02-05 DIAGNOSIS — H6980 Other specified disorders of Eustachian tube, unspecified ear: Secondary | ICD-10-CM | POA: Insufficient documentation

## 2014-02-05 DIAGNOSIS — H6981 Other specified disorders of Eustachian tube, right ear: Secondary | ICD-10-CM

## 2014-02-05 DIAGNOSIS — R399 Unspecified symptoms and signs involving the genitourinary system: Secondary | ICD-10-CM

## 2014-02-05 DIAGNOSIS — M546 Pain in thoracic spine: Secondary | ICD-10-CM | POA: Diagnosis not present

## 2014-02-05 LAB — POCT URINALYSIS DIPSTICK
Bilirubin, UA: NEGATIVE
Blood, UA: NEGATIVE
Glucose, UA: NEGATIVE
Ketones, UA: NEGATIVE
Leukocytes, UA: NEGATIVE
Nitrite, UA: NEGATIVE
Protein, UA: NEGATIVE
Spec Grav, UA: 1.02
Urobilinogen, UA: 0.2
pH, UA: 6

## 2014-02-05 MED ORDER — FLUTICASONE PROPIONATE 50 MCG/ACT NA SUSP: 2.0000 | Freq: Every day | NASAL | Status: DC

## 2014-02-05 NOTE — Progress Notes (Signed)
Patient presents to clinic today c/o pain in right side and back x 2 days.  Pain is intermittent and worse with palpation and ROM.  Denies dysuria, urinary urgency, frequency, or history of nephrolithiasis.  Denies trauma or injury.   Endorses continued ear ache in R ear despite treatment for AOM 1.5 months ago.  Denies change in hearing, dizziness, drainage from ear.  Denies trauma or injury.  Denies symptoms of left ear.  Past Medical History  Diagnosis Date  . Migraine   . High cholesterol   . Depression   . Anxiety   . Dysarthria     Left arm  . History of stroke     Current Outpatient Prescriptions on File Prior to Visit  Medication Sig Dispense Refill  . albuterol (PROVENTIL HFA;VENTOLIN HFA) 108 (90 BASE) MCG/ACT inhaler Inhale 2 puffs into the lungs every 6 (six) hours as needed for wheezing or shortness of breath. 1 Inhaler 6  . amitriptyline (ELAVIL) 25 MG tablet Take 2 tablets (50 mg total) by mouth at bedtime. 60 tablet 3  . Ascorbic Acid (VITAMIN C) 1000 MG tablet Take 1,000 mg by mouth daily.    Marland Kitchen. aspirin 325 MG tablet Take 325 mg by mouth daily.    Marland Kitchen. atorvastatin (LIPITOR) 20 MG tablet Take 1 tablet (20 mg total) by mouth daily. 30 tablet 6  . baclofen (LIORESAL) 10 MG tablet Take 10 mg by mouth 3 (three) times daily. 1 in AM, 1 in afternoon, 2 in evening    . cyproheptadine (PERIACTIN) 4 MG tablet TAKE 1 TABLET BY MOUTH THREE TIMES DAILY AS NEEDED 30 tablet 0  . Diclofenac Potassium (CAMBIA) 50 MG PACK Take 1package at earliest onset of headache x1. 1 each 0  . dicyclomine (BENTYL) 20 MG tablet Take 1 tablet (20 mg total) by mouth every 6 (six) hours. 60 tablet 3  . gabapentin (NEURONTIN) 300 MG capsule Take 300 mg by mouth 2 (two) times daily. Morning, afternoon    . gabapentin (NEURONTIN) 600 MG tablet Take 600 mg by mouth at bedtime.     . Multiple Vitamins-Minerals (MULTIVITAMIN & MINERAL PO) Take by mouth daily.    Marland Kitchen. omeprazole (PRILOSEC) 20 MG capsule Take 20 mg  by mouth daily.    Marland Kitchen. oxyCODONE (OXY IR/ROXICODONE) 5 MG immediate release tablet Take 1 tablet (5 mg total) by mouth every 4 (four) hours as needed for severe pain. 30 tablet 0  . promethazine (PHENERGAN) 25 MG tablet Take 1 tablet (25 mg total) by mouth every 6 (six) hours as needed for nausea or vomiting. 30 tablet 3  . venlafaxine XR (EFFEXOR-XR) 150 MG 24 hr capsule Take 1 capsule (150 mg total) by mouth daily with breakfast. 30 capsule 6   No current facility-administered medications on file prior to visit.    Allergies  Allergen Reactions  . Acetaminophen Hives  . Fish Allergy     Hives and migraines  . Hydromorphone Other (See Comments)    Could not speak or keep her eyes open  . Migraine Formula  [Aspirin-Acetaminophen-Caffeine] Hives    pt is able to eat shellfish without a problem.   . Ondansetron Nausea And Vomiting  . Duloxetine Hcl Rash    hallucinations    Family History  Problem Relation Age of Onset  . Cancer Mother     History   Social History  . Marital Status: Married    Spouse Name: Tammy SoursGreg     Number of Children: 3  .  Years of Education: 10 th   Occupational History  .      Disabled   Social History Main Topics  . Smoking status: Former Smoker    Quit date: 02/22/2011  . Smokeless tobacco: Never Used     Comment: Quit 2013  . Alcohol Use: No  . Drug Use: Yes    Special: Marijuana  . Sexual Activity: None   Other Topics Concern  . None   Social History Narrative   Patient lives at home with her husband Earl LitesGregory.   Disabled.   Education 10 th   Right handed.   Caffeine six cup of coffee daily.     Review of Systems - See HPI.  All other ROS are negative.  BP 118/68 mmHg  Pulse 105  Temp(Src) 98.1 F (36.7 C) (Oral)  Resp 16  Ht 5\' 1"  (1.549 m)  Wt 172 lb 4 oz (78.132 kg)  BMI 32.56 kg/m2  SpO2 99%  Physical Exam  Constitutional: She is oriented to person, place, and time and well-developed, well-nourished, and in no distress.    HENT:  Head: Normocephalic and atraumatic.  Right Ear: No drainage or swelling. Tympanic membrane is not erythematous, not retracted and not bulging. A middle ear effusion is present.  Left Ear: Tympanic membrane, external ear and ear canal normal.  Nose: Nose normal.  Mouth/Throat: Uvula is midline, oropharynx is clear and moist and mucous membranes are normal.  Eyes: Conjunctivae are normal.  Cardiovascular: Normal rate, regular rhythm, normal heart sounds and intact distal pulses.   Pulmonary/Chest: Effort normal and breath sounds normal. No respiratory distress. She has no wheezes. She has no rales. She exhibits no tenderness.  Abdominal: Soft. Bowel sounds are normal. She exhibits no distension and no mass. There is no tenderness. There is no rebound and no guarding.  Negative CVA tenderness.   Musculoskeletal:       Back:  Neurological: She is alert and oriented to person, place, and time.  Skin: Skin is warm and dry. No rash noted.  Psychiatric: Affect normal.  Vitals reviewed.  Assessment/Plan: Eustachian tube dysfunction Rx Flonase to use daily to help relieve fluid.  Follow-up if no improvement in symptoms.

## 2014-02-05 NOTE — Assessment & Plan Note (Signed)
Urine dip unremarkable.  Pain reproducible with palpation and ROM.  MSK in origin. Topical Aspercreme.  Continue muscle relaxant as directed.  No heavy lifting or overexertions.  Symptoms should resolve over the next week.

## 2014-02-05 NOTE — Progress Notes (Signed)
Pre visit review using our clinic review tool, if applicable. No additional management support is needed unless otherwise documented below in the visit note/SLS  

## 2014-02-05 NOTE — Patient Instructions (Signed)
Please use Flonase daily as directed.  For the back pain, apply Topical Aspercreme to the area.  Continue the Baclofen as directed. No heavy lifting or exertion.  Call or return to clinic if symptoms are not improving.  Barotitis Media Barotitis media is inflammation of your middle ear. This occurs when the auditory tube (eustachian tube) leading from the back of your nose (nasopharynx) to your eardrum is blocked. This blockage may result from a cold, environmental allergies, or an upper respiratory infection. Unresolved barotitis media may lead to damage or hearing loss (barotrauma), which may become permanent. HOME CARE INSTRUCTIONS   Use medicines as recommended by your health care provider. Over-the-counter medicines will help unblock the canal and can help during times of air travel.  Do not put anything into your ears to clean or unplug them. Eardrops will not be helpful.  Do not swim, dive, or fly until your health care provider says it is all right to do so. If these activities are necessary, chewing gum with frequent, forceful swallowing may help. It is also helpful to hold your nose and gently blow to pop your ears for equalizing pressure changes. This forces air into the eustachian tube.  Only take over-the-counter or prescription medicines for pain, discomfort, or fever as directed by your health care provider.  A decongestant may be helpful in decongesting the middle ear and make pressure equalization easier. SEEK MEDICAL CARE IF:  You experience a serious form of dizziness in which you feel as if the room is spinning and you feel nauseated (vertigo).  Your symptoms only involve one ear. SEEK IMMEDIATE MEDICAL CARE IF:   You develop a severe headache, dizziness, or severe ear pain.  You have bloody or pus-like drainage from your ears.  You develop a fever.  Your problems do not improve or become worse. MAKE SURE YOU:   Understand these instructions.  Will watch your  condition.  Will get help right away if you are not doing well or get worse. Document Released: 02/05/2000 Document Revised: 11/28/2012 Document Reviewed: 09/04/2012 [facility] Patient Information 2015 [employer] [place]. This information is not intended to replace advice given to you by your health care provider. Make sure you discuss any questions you have with your health care provider.

## 2014-02-05 NOTE — Assessment & Plan Note (Signed)
Rx Flonase to use daily to help relieve fluid.  Follow-up if no improvement in symptoms.

## 2014-02-10 ENCOUNTER — Other Ambulatory Visit: Payer: Self-pay | Admitting: Family Medicine

## 2014-02-10 ENCOUNTER — Ambulatory Visit: Payer: Medicare Other | Admitting: Neurology

## 2014-02-10 MED ORDER — OXYCODONE HCL 5 MG PO TABS: 5.0000 mg | ORAL_TABLET | ORAL | Status: DC | PRN

## 2014-02-10 NOTE — Telephone Encounter (Signed)
Last OV 12-03-13 Oxycodone last filled 12-24-13 #30 with 0

## 2014-02-10 NOTE — Telephone Encounter (Signed)
Med filled and pt notified.  

## 2014-02-19 ENCOUNTER — Encounter: Payer: Self-pay | Admitting: Neurology

## 2014-02-19 ENCOUNTER — Telehealth: Payer: Self-pay | Admitting: Neurology

## 2014-02-19 NOTE — Telephone Encounter (Signed)
[facility] and call with any problems.

## 2014-02-19 NOTE — Telephone Encounter (Signed)
-----   Message from Octaviano Battyebecca S Tat, DO sent at 02/19/2014 12:54 PM EST ----- Lesly RubensteinJade,   Can you get [facility] care everywhere notes (looking for botox stuff)

## 2014-02-21 ENCOUNTER — Other Ambulatory Visit: Payer: Self-pay | Admitting: Neurology

## 2014-02-24 ENCOUNTER — Encounter: Payer: Self-pay | Admitting: Neurology

## 2014-02-24 ENCOUNTER — Ambulatory Visit (INDEPENDENT_AMBULATORY_CARE_PROVIDER_SITE_OTHER): Payer: Medicare Other | Admitting: Neurology

## 2014-02-24 VITALS — BP 128/70 | HR 80 | Ht 61.0 in | Wt 172.0 lb

## 2014-02-24 DIAGNOSIS — G43709 Chronic migraine without aura, not intractable, without status migrainosus: Secondary | ICD-10-CM | POA: Diagnosis not present

## 2014-02-24 DIAGNOSIS — G8110 Spastic hemiplegia affecting unspecified side: Secondary | ICD-10-CM

## 2014-02-24 DIAGNOSIS — I773 Arterial fibromuscular dysplasia: Secondary | ICD-10-CM | POA: Diagnosis not present

## 2014-02-24 DIAGNOSIS — IMO0002 Reserved for concepts with insufficient information to code with codable children: Secondary | ICD-10-CM

## 2014-02-24 MED ORDER — DICLOFENAC POTASSIUM(MIGRAINE) 50 MG PO PACK: PACK | ORAL | Status: DC

## 2014-02-24 NOTE — Progress Notes (Signed)
Courtney Garza was seen today in neurologic consultation at the request of Dr. Everlena Garza.  Her PCP is Courtney Rhymes, MD.  The patient presents today for consultation for possible Botox.  She is accompanied by her husband who supplements the history.  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  Prior records that are available to me were reviewed.  She has been seen at the [facility] clinic, [facility] as well as [facility] neurology.  She is currently being seen by Dr. Everlena Garza for chronic migraine as well as spastic hemiplegia.  I was asked to see her for possible Botox.  I have contacted the [facility] clinic for her previous Botox records, but they reported that they had no prior Botox records.  Pt states today that her botox was done by a different physician in [place], [place] at [facility].    Dr. Zannie Garza records indicate that the patient got injections of 600 units in the left upper extremity and 800 units in the left lower extremity.  Given these large amounts, one assumes that this was likely Dysport.  When she moved here, she was seen by Dr. Terrace Garza and was last given 400 units of Xeomin on 11/27/2013.  Muscles that were injected included the following:  Left pronator teres, biceps, brachialis, flexor carpi ulnaris, flexor carpi radialis, flexor digitorum profundus, flexor digitorum superficialis, palmaris longus, abductor pollicis brevis and lumbricals of the hand.  Pt reports that botox is wearing off.  The arm is pulling up.  The L hand is fisted and difficult to clean.  She has little movement of the L arm on her own but if she yawns, it may move on its own.  As above, the patient has a documented history of migraine headache.  They are being managed by Dr. Everlena Garza.  She has apparently received botox for these in the past but it has been a long time since she has had this (about 8 months).  Again, I don't  have [place] clinic notes as to the exact timing of last botox.  I do have [place] clinic notes that indicated that she did have occipital nerve injections.  Her headaches are generally located in the right frontal and right occipital region and are described as aching in quality.  She has blurred vision but no skintillating scotomas.  She can have nausea, vomiting, photophobia, phonophobia.  She feels that she has debilitating headaches 20 days of the month.  Has them all year round.  Husband states that she was told in [place] that these were cluster headaches.    ALLERGIES:   Allergies  Allergen Reactions  . Acetaminophen Hives  . Fish Allergy     Hives and migraines  . Hydromorphone Other (See Comments)    Could not speak or keep her eyes open  . Migraine Formula  [Aspirin-Acetaminophen-Caffeine] Hives    pt is able to eat shellfish without a problem.   . Ondansetron Nausea And Vomiting  . Duloxetine Hcl Rash    hallucinations    CURRENT MEDICATIONS:  Outpatient Encounter Prescriptions as of 02/24/2014  Medication Sig  . albuterol (PROVENTIL HFA;VENTOLIN HFA) 108 (90 BASE) MCG/ACT inhaler Inhale 2 puffs into the lungs every 6 (six) hours as needed for wheezing or shortness of breath.  Marland Kitchen amitriptyline (ELAVIL) 25 MG tablet Take 2 tablets (50 mg total) by mouth at bedtime.  . Ascorbic Acid (VITAMIN C) 1000 MG tablet  Take 1,000 mg by mouth daily.  Marland Kitchen aspirin 325 MG tablet Take 325 mg by mouth daily.  Marland Kitchen atorvastatin (LIPITOR) 20 MG tablet Take 1 tablet (20 mg total) by mouth daily.  . baclofen (LIORESAL) 10 MG tablet Take 10 mg by mouth 3 (three) times daily. 1 in AM, 1 in afternoon, 2 in evening  . cyproheptadine (PERIACTIN) 4 MG tablet TAKE 1 TABLET BY MOUTH THREE TIMES DAILY AS NEEDED  . Diclofenac Potassium (CAMBIA) 50 MG PACK Take 1package at earliest onset of headache x1.  . dicyclomine (BENTYL) 20 MG tablet Take 1 tablet (20 mg total) by mouth every 6 (six) hours.  . gabapentin  (NEURONTIN) 300 MG capsule Take 300 mg by mouth 2 (two) times daily. Morning, afternoon  . gabapentin (NEURONTIN) 600 MG tablet Take 600 mg by mouth at bedtime.   . Multiple Vitamins-Minerals (MULTIVITAMIN & MINERAL PO) Take by mouth daily.  Marland Kitchen omeprazole (PRILOSEC) 20 MG capsule Take 20 mg by mouth daily.  . promethazine (PHENERGAN) 25 MG tablet Take 1 tablet (25 mg total) by mouth every 6 (six) hours as needed for nausea or vomiting.  . venlafaxine XR (EFFEXOR-XR) 150 MG 24 hr capsule Take 1 capsule (150 mg total) by mouth daily with breakfast.  . [DISCONTINUED] Diclofenac Potassium (CAMBIA) 50 MG PACK Take 1package at earliest onset of headache x1.  . [DISCONTINUED] fluticasone (FLONASE) 50 MCG/ACT nasal spray Place 2 sprays into both nostrils daily.  . [DISCONTINUED] oxyCODONE (OXY IR/ROXICODONE) 5 MG immediate release tablet Take 1 tablet (5 mg total) by mouth every 4 (four) hours as needed for severe pain.    PAST MEDICAL HISTORY:   Past Medical History  Diagnosis Date  . Migraine   . High cholesterol   . Depression   . Anxiety   . Dysarthria     Left arm  . History of stroke     PAST SURGICAL HISTORY:   Past Surgical History  Procedure Laterality Date  . Vaginal hysterectomy    . Appendectomy    . Breast enhancement surgery    . Vena cava filter placement      and removal    SOCIAL HISTORY:   History   Social History  . Marital Status: Married    Spouse Name: [name]     Number of Children: 3  . Years of Education: 10 th   Occupational History  .      Disabled   Social History Main Topics  . Smoking status: Former Smoker    Quit date: 02/22/2011  . Smokeless tobacco: Never Used     Comment: Quit 2013  . Alcohol Use: No  . Drug Use: Yes    Special: Marijuana  . Sexual Activity: Not on file   Other Topics Concern  . Not on file   Social History Narrative   Patient lives at home with her husband [name].   Disabled.   Education 10 th   Right handed.    Caffeine six cup of coffee daily.      FAMILY HISTORY:   Family Status  Relation Status Death Age  . Mother Alive     uterine cancer  . Father Alive     aortic aneurysms  . Brother Alive     healthy  . Son Alive     healthy  . Daughter Alive     healthy  . Daughter Alive     healthy    ROS:  Some paresthesias (not painful) over  the L face (V2 distribution).  A complete 10 system review of systems was obtained and was unremarkable apart from what is mentioned above.  PHYSICAL EXAMINATION:    VITALS:   Filed Vitals:   02/24/14 0915  BP: 128/70  Pulse: 80  Height:  (1.549 m)  Weight: 172 lb (78.019 kg)    GEN:  Normal appears female in no acute distress.  Appears stated age. HEENT:  Normocephalic, atraumatic. The mucous membranes are moist. The superficial temporal arteries are without ropiness or tenderness. Cardiovascular: Regular rate and rhythm. Lungs: Clear to auscultation bilaterally. Neck/Heme: There are no carotid bruits noted bilaterally.  NEUROLOGICAL: Orientation:  The patient is alert and oriented x 3.  Fund of knowledge is appropriate.  Recent and remote memory intact.  Attention span and concentration normal.  Repeats and names without difficulty. Cranial nerves: There is UMN L facial droop.The pupils are equal round and reactive to light bilaterally. Fundoscopic exam reveals clear disc margins bilaterally. Extraocular muscles are intact.  She has difficulty participating with formal VF testing, stating that "I have peripheral loss." Speech is fluent and clear. Soft palate rises symmetrically and there is no tongue deviation. Hearing is intact to conversational tone. Tone: Marked spasticity of the LUE and more mild of the LLE Sensation: Sensation is intact to light touch and pinprick throughout (facial, trunk, extremities).  Slight decrease in pinprick in the left hemisoma compared to that of the right.  There is decrease sensation to temperature in the left  hemisoma compared to the right.  Vibration is intact at the bilateral big toe. There is no extinction with double simultaneous stimulation. There is no sensory dermatomal level identified. Coordination:  The patient has no difficulty with rapid alternating movements in the right upper extremity or right lower extremity.  She has difficulty in the left, because of weakness. Motor: Strength is 5/5 in the right upper and lower extremities.   The left upper extremity is plegic.  The forearm is pronated.  There is slight left forearm flexion.  The wrist is flexed.  All of the fingers are flexed at the metacarpal phalangeal joints, except the thumb which is extended.  The fingernails are long and embedded into the skin.  There is 4/5 strength of the left quadriceps. Gait and Station: The patient circumducts the left foot in order to ambulate.  She is quite spastic.  She is unstable.  She is unable to stand with her feet together.   IMPRESSION/PLAN  1.  Spastic hemiplegia secondary to history of right carotid dissection secondary to fibromuscular dysplasia.  -The patient's left forearm is very spastic and the hand is fisted.  The fingernails are embedded into the skin, causing a cleansing issue and she is at significant risk for skin breakdown.  She would like to proceed with Botox.  We would give her onobotulinum toxin type A.  I told her that I see no reason to do the APB nor the hand lumbricals, and she agrees with that.  We will get her scheduled.  Risks, benefits, side effects and alternative therapies were discussed.  The opportunity to ask questions was given and they were answered to the best of my ability.  The patient expressed understanding and willingness to follow the outlined treatment protocols. 2.  Chronic migraine.  -Previously, they were told that perhaps this represented cluster.  Her history does not sound indicative of cluster and agree with Dr. Everlena Garza that this sounds much more like chronic  migraine.  They would like to proceed with Botox which has worked well in the past.  She should be able to do this the same day that she does the Botox for spastic hemiplegia. 3.  Will call her to schedule injection based on Dr. Everlena Garza schedule.  Will talk to Dr. Everlena Garza re: refill of Cambia.  Greater than 50% of 60 min visit in counseling and coordinating care.  ADDENDUM:  Records obtained from [facility].  She did, in fact, receive dysport.

## 2014-02-25 ENCOUNTER — Other Ambulatory Visit (HOSPITAL_BASED_OUTPATIENT_CLINIC_OR_DEPARTMENT_OTHER): Payer: Medicare Other

## 2014-02-25 ENCOUNTER — Other Ambulatory Visit: Payer: Self-pay | Admitting: Family Medicine

## 2014-02-25 ENCOUNTER — Ambulatory Visit (INDEPENDENT_AMBULATORY_CARE_PROVIDER_SITE_OTHER): Payer: Medicare Other | Admitting: Family Medicine

## 2014-02-25 ENCOUNTER — Encounter: Payer: Self-pay | Admitting: Family Medicine

## 2014-02-25 VITALS — BP 124/80 | HR 112 | Temp 98.2°F | Resp 17 | Wt 173.5 lb

## 2014-02-25 DIAGNOSIS — R102 Pelvic and perineal pain unspecified side: Secondary | ICD-10-CM

## 2014-02-25 DIAGNOSIS — H6983 Other specified disorders of Eustachian tube, bilateral: Secondary | ICD-10-CM

## 2014-02-25 DIAGNOSIS — H6993 Unspecified Eustachian tube disorder, bilateral: Secondary | ICD-10-CM

## 2014-02-25 MED ORDER — PREDNISONE 10 MG PO TABS: ORAL_TABLET | ORAL | Status: DC

## 2014-02-25 NOTE — Progress Notes (Signed)
Pre visit review using our clinic review tool, if applicable. No additional management support is needed unless otherwise documented below in the visit note. 

## 2014-02-25 NOTE — Patient Instructions (Signed)
Follow up as needed We'll notify you of your ultrasound results and make any changes if needed We'll call you with your GYN appt Start the Prednisone as directed- take w/ food Take Benadryl, Claritin or Zyrtec daily for allergy component Drink plenty of fluids Call with any questions or concerns Happy New Year!

## 2014-02-25 NOTE — Assessment & Plan Note (Addendum)
New.  Pt has hx of multiple ovarian cysts [place] on [place].  Pt is again having similar pelvic pain.  Well appearing in office, no acute distress.  Pt has been intolerant to OCPs in the past.  Get pelvic US to assess, refer to GYN for evaluation and tx.  Pt expressed understanding and is in agreement w/ plan.

## 2014-02-25 NOTE — Assessment & Plan Note (Signed)
Pt now w/ bilateral sxs.  Did not tolerate Flonase and not able to afford the other nasal steroids b/c they are not generic/preferred.  Continue daily antihistamine.  Start short course of prednisone to improve inflammation.  Reviewed supportive care and red flags that should prompt return.  Pt expressed understanding and is in agreement w/ plan.

## 2014-02-25 NOTE — Progress Notes (Signed)
   Subjective:    Patient ID: Courtney Garza, female    DOB: 02/22/1970, 44 y.o.   MRN: [id number]  HPI Ear pain- pt was unable to use Flonase b/c it 'made me sick'.  + nausea, abd pain.  Pt was dx'd w/ Eustachian tube dysfxn at last visit.  Continues to have ear pain.  Was initially just the R ear but over the last few days has developed L ear pain.  Bilateral lower abd pain- pt has hx of ovarian cysts seen on CT, s/p hysterectomy.  sxs started 'a couple of weeks ago'.  Pain is unchanged from when it started.     Review of Systems For ROS see HPI     Objective:   Physical Exam  Constitutional: She appears well-developed and well-nourished. No distress.  HENT:  Head: Normocephalic and atraumatic.  Right Ear: Tympanic membrane is retracted.  Left Ear: Tympanic membrane is retracted.  Nose: Mucosal edema and rhinorrhea present. Right sinus exhibits no maxillary sinus tenderness and no frontal sinus tenderness. Left sinus exhibits no maxillary sinus tenderness and no frontal sinus tenderness.  Mouth/Throat: Mucous membranes are normal. Posterior oropharyngeal erythema (w/ PND) present.  Eyes: Conjunctivae and EOM are normal. Pupils are equal, round, and reactive to light.  Neck: Normal range of motion. Neck supple.  Cardiovascular: Normal rate, regular rhythm and normal heart sounds.   Pulmonary/Chest: Effort normal and breath sounds normal. No respiratory distress. She has no wheezes. She has no rales.  Abdominal: Soft. Bowel sounds are normal. She exhibits no distension. There is tenderness (bilateral lower quadrant/pelvic TTP). There is no rebound and no guarding.  Lymphadenopathy:    She has no cervical adenopathy.  Vitals reviewed.         Assessment & Plan:

## 2014-02-26 ENCOUNTER — Ambulatory Visit (HOSPITAL_BASED_OUTPATIENT_CLINIC_OR_DEPARTMENT_OTHER)
Admission: RE | Admit: 2014-02-26 | Discharge: 2014-02-26 | Disposition: A | Discharge status: Home / Self Care | Payer: Medicare Other | Source: Ambulatory Visit | Attending: Family Medicine | Admitting: Family Medicine

## 2014-02-26 ENCOUNTER — Telehealth: Payer: Self-pay | Admitting: Family Medicine

## 2014-02-26 DIAGNOSIS — R102 Pelvic and perineal pain: Secondary | ICD-10-CM | POA: Insufficient documentation

## 2014-02-26 DIAGNOSIS — Z90710 Acquired absence of both cervix and uterus: Secondary | ICD-10-CM | POA: Diagnosis not present

## 2014-02-26 MED ORDER — OMEPRAZOLE 20 MG PO CPDR: 20.0000 mg | DELAYED_RELEASE_CAPSULE | Freq: Every day | ORAL | Status: DC

## 2014-02-26 NOTE — Telephone Encounter (Signed)
Med filled.  

## 2014-02-26 NOTE — Telephone Encounter (Signed)
Caller name: [facility]  Relation to pt: other  Call back number: [phone]   Reason for call:  [facility] requesting refill omeprazole (PRILOSEC) 20 MG capsule

## 2014-02-27 ENCOUNTER — Telehealth: Payer: Self-pay | Admitting: Neurology

## 2014-02-27 NOTE — Telephone Encounter (Signed)
Pt is returning your call

## 2014-02-27 NOTE — Telephone Encounter (Signed)
Per Dr Jaffe/Dr Tat Botox appt scheduled on 03/13/14 at 11:45 am. [facility] call back to make her aware.

## 2014-02-27 NOTE — Telephone Encounter (Signed)
Patient made aware of appt date/time.  

## 2014-03-08 ENCOUNTER — Encounter: Payer: Self-pay | Admitting: Family Medicine

## 2014-03-13 ENCOUNTER — Ambulatory Visit (INDEPENDENT_AMBULATORY_CARE_PROVIDER_SITE_OTHER): Payer: Medicare Other | Admitting: Neurology

## 2014-03-13 ENCOUNTER — Other Ambulatory Visit: Payer: Self-pay | Admitting: *Deleted

## 2014-03-13 VITALS — BP 122/64 | HR 70 | Temp 97.5°F | Resp 18 | Wt 174.4 lb

## 2014-03-13 DIAGNOSIS — G43709 Chronic migraine without aura, not intractable, without status migrainosus: Secondary | ICD-10-CM

## 2014-03-13 DIAGNOSIS — G43009 Migraine without aura, not intractable, without status migrainosus: Secondary | ICD-10-CM

## 2014-03-13 DIAGNOSIS — G8110 Spastic hemiplegia affecting unspecified side: Secondary | ICD-10-CM

## 2014-03-13 DIAGNOSIS — G8114 Spastic hemiplegia affecting left nondominant side: Secondary | ICD-10-CM | POA: Diagnosis not present

## 2014-03-13 MED ORDER — ONABOTULINUMTOXINA 100 UNITS IJ SOLR
300.0000 [IU] | Freq: Once | INTRAMUSCULAR | Status: AC
  Administered 2014-03-13: 300 [IU] via INTRADERMAL

## 2014-03-13 NOTE — Procedures (Signed)
[facility]   Procedure Note Botox  Attending: Dr. Shon MilletAdam Dailynn Nancarrow  I.  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  Risk discussed included, but were not limited pain and discomfort, bleeding, bruising, excessive weakness, venous thrombosis, muscle atrophy and dysphagia.  A copy of the patient medication guide was given to the patient which explains the blackbox warning.  Patients identity and treatment sites confirmed Yes.  .  Details of Procedure: Skin was cleaned with alcohol.  A hollow monopolor needle was introduced to the target muscle.  Prior to injection, the needle plunger was aspirated to make sure the needle was not within a blood vessel.  There was no blood retrieved on aspiration.    Following is a summary of the muscles injected  And the amount of Botulinum toxin used:   Dilution 0.9% preservative free saline mixed with 100 u Botox type A to make 10 U per 0.1cc  Injections  Left flexor digitorum profundus 50 units (total of 3 sites) Left flexor digitorum superficialis 50 units (total of 3 sites) Left flexor carpi ulnaris 25 units (total of 1 site) Left flexor carpi radialis 25 units (total of 1 site) TOTAL UNITS:  150 units  Agent: Botulinum Type A ( Onobotulinum Toxin type A ).  2 vials of Botox were used, each containing 100 units and freshly diluted with 1 mL of sterile, non-preserved saline   Total injected (Units): 150 units  Total wasted (Units): none wasted   Pt tolerated procedure well without complications.   Reinjection is anticipated in 3 months.   II.  Preoperative Diagnosis(es): Chronic migraine  Consent obtained from: The patient Benefits discussed included, but were not limited to decreased muscle tightness, increased joint range of motion, and  decreased pain.  Risk discussed included, but were not limited pain and discomfort, bleeding, bruising, excessive weakness, venous thrombosis, muscle atrophy and dysphagia.  A copy of the patient medication guide was given to the patient which explains the blackbox warning.  Patients identity and treatment sites confirmed Yes.  .  Details of Procedure: Skin was cleaned with alcohol.  Prior to injection, the needle plunger was aspirated to make sure the needle was not within a blood vessel.  There was no blood retrieved on aspiration.    Following is a summary of the muscles injected  And the amount of Botulinum toxin used:  Dilution 0.9% preservative free saline mixed with 100 u Botox type A to make 5 U per 0.1cc  Injections  # of injection sites Muscle   Dose Total 1     Corrugator   5 units 1    Procerus   10 units 2 each side  Frontalis  20 units 4 each side  Temporalis  40 units 3 each side  Occipitalis  30 units 2 each side  Cervical paraspinals 20 units 3 each side  Trapezius  25 units    Total 31       Agent: Botulinum Type A ( Onobotulinum Toxin type A ).  3 vials of Botox were used, each containing 50 units and freshly diluted with 2 mL of sterile, non-perserved saline   Total injected (Units): 150 units  Total wasted (Units): none wasted   Pt tolerated procedure well without complications.   Reinjection is anticipated in 3 months.

## 2014-03-20 ENCOUNTER — Telehealth: Payer: Self-pay | Admitting: Neurology

## 2014-03-20 NOTE — Telephone Encounter (Signed)
Pt canceled 04/18/14 due to pt r/s to 04/24/14

## 2014-03-25 ENCOUNTER — Encounter: Payer: Self-pay | Admitting: *Deleted

## 2014-03-25 ENCOUNTER — Other Ambulatory Visit: Payer: Self-pay | Admitting: *Deleted

## 2014-03-27 ENCOUNTER — Encounter: Payer: Self-pay | Admitting: Family Medicine

## 2014-03-27 ENCOUNTER — Ambulatory Visit (INDEPENDENT_AMBULATORY_CARE_PROVIDER_SITE_OTHER): Payer: Medicare Other | Admitting: Family Medicine

## 2014-03-27 VITALS — BP 120/70 | HR 110 | Temp 98.0°F | Resp 17 | Ht 62.5 in | Wt 171.4 lb

## 2014-03-27 DIAGNOSIS — Z1231 Encounter for screening mammogram for malignant neoplasm of breast: Secondary | ICD-10-CM | POA: Diagnosis not present

## 2014-03-27 DIAGNOSIS — Z79899 Other long term (current) drug therapy: Secondary | ICD-10-CM | POA: Diagnosis not present

## 2014-03-27 DIAGNOSIS — Z0000 Encounter for general adult medical examination without abnormal findings: Secondary | ICD-10-CM | POA: Diagnosis not present

## 2014-03-27 DIAGNOSIS — E7800 Pure hypercholesterolemia, unspecified: Secondary | ICD-10-CM

## 2014-03-27 MED ORDER — GABAPENTIN 600 MG PO TABS: 600.0000 mg | ORAL_TABLET | Freq: Every day | ORAL | Status: DC

## 2014-03-27 NOTE — Progress Notes (Signed)
   Subjective:    Patient ID: Courtney Garza, female    DOB: 05/31/1970, 44 y.o.   MRN: [id number]  HPI Here today for CPE.  Risk Factors: Hyperlipidemia- chronic problem, on Lipitor. Fibromuscular dysplasia- chronic problem, following w/ Dr Sherlie BanJaffe/Tat Physical Activity: limited due to chronic medical conditions Fall Risk: moderate due to L spastic hemiparesis Depression: chronic problem, currently well controlled Hearing: normal to conversational tones and whispered voice at 6 ft ADL's: independent Cognitive: normal linear thought process, memory and attention Home Safety: safe at home, lives w/ husband Height, Weight, BMI, Visual Acuity: see vitals, vision corrected to 20/20 w/ glasses Counseling: due for mammo.  No need for pap due to hysterectomy.  Too young for colonoscopy (and pt has already had). Healthcare POA/Living Will: done Labs Ordered: See A&P Care Plan: See A&P    Review of Systems Patient reports no vision/ hearing changes, adenopathy,fever, weight change,  persistant/recurrent hoarseness , swallowing issues, chest pain, palpitations, edema, persistant/recurrent cough, hemoptysis, dyspnea (rest/exertional/paroxysmal nocturnal), gastrointestinal bleeding (melena, rectal bleeding), abdominal pain, significant heartburn, bowel changes, GU symptoms (dysuria, hematuria, incontinence), Gyn symptoms (abnormal  bleeding, pain),  syncope, focal weakness, memory loss, numbness & tingling, skin/hair/nail changes, abnormal bruising or bleeding, anxiety, or depression.     Objective:   Physical Exam General Appearance:    Alert, cooperative, no distress, appears stated age  Head:    Normocephalic, without obvious abnormality, atraumatic  Eyes:    PERRL, conjunctiva/corneas clear, EOM's intact, fundi    benign, both eyes  Ears:    Normal TM's and external ear canals, both ears  Nose:   Nares normal, septum midline, mucosa normal, no drainage    or sinus tenderness  Throat:   Lips,  mucosa, and tongue normal; teeth and gums normal  Neck:   Supple, symmetrical, trachea midline, no adenopathy;    Thyroid: no enlargement/tenderness/nodules  Back:     Symmetric, no curvature, ROM normal, no CVA tenderness  Lungs:     Clear to auscultation bilaterally, respirations unlabored  Chest Wall:    No tenderness or deformity   Heart:    Regular rate and rhythm, S1 and S2 normal, no murmur, rub   or gallop  Breast Exam:    Deferred to mammo  Abdomen:     Soft, non-tender, bowel sounds active all four quadrants,    no masses, no organomegaly  Genitalia:    Deferred to GYN  Rectal:    Extremities:   Extremities normal, atraumatic, no cyanosis or edema  Pulses:   2+ and symmetric all extremities  Skin:   Skin color, texture, turgor normal, no rashes or lesions  Lymph nodes:   Cervical, supraclavicular, and axillary nodes normal  Neurologic:   CNII-XII intact          Assessment & Plan:

## 2014-03-27 NOTE — Progress Notes (Signed)
Pre visit review using our clinic review tool, if applicable. No additional management support is needed unless otherwise documented below in the visit note. 

## 2014-03-27 NOTE — Patient Instructions (Signed)
Follow up in 6 months to recheck lipids We'll notify you of your lab results and make any changes if needed We'll call you with the mammogram appt Call with any questions or concerns Happy Valentine's Day!!

## 2014-03-28 LAB — CBC WITH DIFFERENTIAL/PLATELET
Basophils Absolute: 0 10*3/uL (ref 0.0–0.1)
Basophils Relative: 0.6 % (ref 0.0–3.0)
Eosinophils Absolute: 0.2 10*3/uL (ref 0.0–0.7)
Eosinophils Relative: 3.3 % (ref 0.0–5.0)
HCT: 43.8 % (ref 36.0–46.0)
Hemoglobin: 14.6 g/dL (ref 12.0–15.0)
Lymphocytes Relative: 43 % (ref 12.0–46.0)
Lymphs Abs: 2.8 10*3/uL (ref 0.7–4.0)
MCHC: 33.4 g/dL (ref 30.0–36.0)
MCV: 93.8 fl (ref 78.0–100.0)
Monocytes Absolute: 0.4 10*3/uL (ref 0.1–1.0)
Monocytes Relative: 6.3 % (ref 3.0–12.0)
Neutro Abs: 3 10*3/uL (ref 1.4–7.7)
Neutrophils Relative %: 46.8 % (ref 43.0–77.0)
Platelets: 289 10*3/uL (ref 150.0–400.0)
RBC: 4.67 Mil/uL (ref 3.87–5.11)
RDW: 13.6 % (ref 11.5–15.5)
WBC: 6.5 10*3/uL (ref 4.0–10.5)

## 2014-03-28 LAB — TSH: TSH: 1.37 u[IU]/mL (ref 0.35–4.50)

## 2014-03-28 LAB — HEPATIC FUNCTION PANEL
ALT: 16 U/L (ref 0–35)
AST: 19 U/L (ref 0–37)
Albumin: 4.1 g/dL (ref 3.5–5.2)
Alkaline Phosphatase: 121 U/L — ABNORMAL HIGH (ref 39–117)
Bilirubin, Direct: 0.1 mg/dL (ref 0.0–0.3)
Total Bilirubin: 0.3 mg/dL (ref 0.2–1.2)
Total Protein: 7.7 g/dL (ref 6.0–8.3)

## 2014-03-28 LAB — LIPID PANEL
Cholesterol: 165 mg/dL (ref 0–200)
HDL: 68.6 mg/dL (ref >39.00)
LDL Cholesterol: 66 mg/dL (ref 0–99)
NonHDL: 96.4
Total CHOL/HDL Ratio: 2
Triglycerides: 151 mg/dL — ABNORMAL HIGH (ref 0.0–149.0)
VLDL: 30.2 mg/dL (ref 0.0–40.0)

## 2014-03-28 LAB — BASIC METABOLIC PANEL
BUN: 4 mg/dL — ABNORMAL LOW (ref 6–23)
CO2: 28 mEq/L (ref 19–32)
Calcium: 9.8 mg/dL (ref 8.4–10.5)
Chloride: 103 mEq/L (ref 96–112)
Creatinine, Ser: 0.69 mg/dL (ref 0.40–1.20)
GFR: 98.12 mL/min (ref >60.00)
Glucose, Bld: 89 mg/dL (ref 70–99)
Potassium: 3.9 mEq/L (ref 3.5–5.1)
Sodium: 140 mEq/L (ref 135–145)

## 2014-03-28 LAB — VITAMIN D 25 HYDROXY (VIT D DEFICIENCY, FRACTURES): VITD: 25.04 ng/mL — ABNORMAL LOW (ref 30.00–100.00)

## 2014-03-30 NOTE — Assessment & Plan Note (Signed)
Pt's PE WNL w/ exception of known L spastic hemiparesis.  Due for mammo- order entered.  No need for pap due to hysterectomy.  Written screening schedule updated and given to pt.  Check labs.  Anticipatory guidance provided.

## 2014-03-30 NOTE — Assessment & Plan Note (Signed)
Chronic problem.  Tolerating statin w/o difficulty.  Check labs.  Adjust meds prn  

## 2014-04-02 ENCOUNTER — Ambulatory Visit (INDEPENDENT_AMBULATORY_CARE_PROVIDER_SITE_OTHER): Payer: Medicare Other | Admitting: Medical

## 2014-04-02 ENCOUNTER — Ambulatory Visit (HOSPITAL_BASED_OUTPATIENT_CLINIC_OR_DEPARTMENT_OTHER)
Admission: RE | Admit: 2014-04-02 | Discharge: 2014-04-02 | Disposition: A | Discharge status: Home / Self Care | Payer: Medicare Other | Source: Ambulatory Visit | Attending: Medical | Admitting: Medical

## 2014-04-02 ENCOUNTER — Encounter: Payer: Self-pay | Admitting: Medical

## 2014-04-02 VITALS — BP 115/81 | HR 113 | Temp 98.3°F | Ht 62.5 in | Wt 173.0 lb

## 2014-04-02 DIAGNOSIS — R0781 Pleurodynia: Secondary | ICD-10-CM | POA: Diagnosis not present

## 2014-04-02 NOTE — Progress Notes (Signed)
Subjective:    Patient ID: Courtney Garza, female    DOB: 05/01/1970, 44 y.o.   MRN: [id number]  HPI  Pt in with some left side rib area pain .(This started 5 days ago) Faint pain at rest. But when touches the area she has severe pain. No fall or trauma. No rash to skin. No blister outbreak out. Slight burning pain that occurs sporadically. No chest pain. No cough, no fever, no chills. No sob. No cough.  Pt does have cambia/diclofenac available.   Pt tells me one day she had no urination. Went for 24 hours without urinating. This was 5 days ago. Then the above occurred. No history of any kidney problems. Pt GFR normal 6 days ago. Also since that 24 hour period pt is urinating well without any problems.  NO chest pain. No arm pain, no jaw pain   Pt baseline pulse is always high.     Review of Systems  Constitutional: Negative for fever, diaphoresis and fatigue.  Respiratory: Negative for cough, choking, chest tightness, shortness of breath and wheezing.   Cardiovascular: Negative for chest pain and palpitations.  Genitourinary: Negative for dysuria, urgency, frequency, hematuria, flank pain, decreased urine volume, enuresis, genital sores, menstrual problem, pelvic pain and dyspareunia.       One day 5 days ago no urination. But next day resumed normal urination and no problems since. Bmp around that time showed good gfr.  Musculoskeletal: Negative for back pain.       Lt lower rib pain.   Past Medical History  Diagnosis Date  . Migraine   . High cholesterol   . Depression   . Anxiety   . Dysarthria     Left arm  . History of stroke     History   Social History  . Marital Status: Married    Spouse Name: Tammy SoursGreg   . Number of Children: 3  . Years of Education: 10 th   Occupational History  .      Disabled   Social History Main Topics  . Smoking status: Former Smoker    Quit date: 02/22/2011  . Smokeless tobacco: Never Used     Comment: Quit 2013  . Alcohol Use:  No  . Drug Use: Yes    Special: Marijuana  . Sexual Activity: Not on file   Other Topics Concern  . Not on file   Social History Narrative   Patient lives at home with her husband Earl LitesGregory.   Disabled.   Education 10 th   Right handed.   Caffeine six cup of coffee daily.      Past Surgical History  Procedure Laterality Date  . Vaginal hysterectomy    . Appendectomy    . Breast enhancement surgery    . Vena cava filter placement      and removal    Family History  Problem Relation Age of Onset  . Cancer Mother     Allergies  Allergen Reactions  . Acetaminophen Hives  . Fish Allergy     Hives and migraines  . Hydromorphone Other (See Comments)    Could not speak or keep her eyes open  . Migraine Formula  [Aspirin-Acetaminophen-Caffeine] Hives    pt is able to eat shellfish without a problem.   . Ondansetron Nausea And Vomiting  . Duloxetine Hcl Rash    hallucinations    Current Outpatient Prescriptions on File Prior to Visit  Medication Sig Dispense Refill  . albuterol (PROVENTIL HFA;VENTOLIN  HFA) 108 (90 BASE) MCG/ACT inhaler Inhale 2 puffs into the lungs every 6 (six) hours as needed for wheezing or shortness of breath. 1 Inhaler 6  . amitriptyline (ELAVIL) 25 MG tablet Take 2 tablets (50 mg total) by mouth at bedtime. 60 tablet 3  . Ascorbic Acid (VITAMIN C) 1000 MG tablet Take 1,000 mg by mouth daily.    Marland Kitchen aspirin 325 MG tablet Take 325 mg by mouth daily.    Marland Kitchen atorvastatin (LIPITOR) 20 MG tablet Take 1 tablet (20 mg total) by mouth daily. 30 tablet 6  . baclofen (LIORESAL) 10 MG tablet Take 1 tablet in the am, 1 tablet in the afternoon, and 2 tablets in the pm    . cyproheptadine (PERIACTIN) 4 MG tablet TAKE 1 TABLET BY MOUTH THREE TIMES DAILY AS NEEDED 30 tablet 0  . Diclofenac Potassium (CAMBIA) 50 MG PACK Take 1package at earliest onset of headache x1. 1 each 2  . dicyclomine (BENTYL) 20 MG tablet Take 1 tablet (20 mg total) by mouth every 6 (six) hours. 60  tablet 3  . gabapentin (NEURONTIN) 300 MG capsule Take 300 mg by mouth 2 (two) times daily. Morning, afternoon    . gabapentin (NEURONTIN) 600 MG tablet Take 1 tablet (600 mg total) by mouth at bedtime. 30 tablet 3  . Multiple Vitamins-Minerals (MULTIVITAMIN & MINERAL PO) Take by mouth daily.    Marland Kitchen omeprazole (PRILOSEC) 20 MG capsule Take 1 capsule (20 mg total) by mouth daily. 30 capsule 6  . promethazine (PHENERGAN) 25 MG tablet Take 1 tablet (25 mg total) by mouth every 6 (six) hours as needed for nausea or vomiting. 30 tablet 3  . venlafaxine XR (EFFEXOR-XR) 150 MG 24 hr capsule Take 1 capsule (150 mg total) by mouth daily with breakfast. 30 capsule 6   No current facility-administered medications on file prior to visit.    BP 115/81 mmHg  Pulse 113  Temp(Src) 98.3 F (36.8 C) (Oral)  Ht 5' 2.5" (1.588 m)  Wt 173 lb (78.472 kg)  BMI 31.12 kg/m2  SpO2 98%      Objective:   Physical Exam   General Mental Status- Alert. General Appearance- Not in acute distress.   Skin General: Color- Normal Color. Moisture- Normal Moisture.  Neck Carotid Arteries- Normal color. Moisture- Normal Moisture. No carotid bruits. No JVD. No tracheal deviation.  Chest and Lung Exam Auscultation: Breath Sounds:-Normal. Even and unlabore.  Cardiovascular Auscultation:Rythm- Regular. Murmurs & Other Heart Sounds:Auscultation of the heart reveals- No Murmurs.    Neurologic Cranial Nerve exam:- CN III-XII intact(No nystagmus), symmetric smile. Except LUE deficit from prior stroke.   Skin- no rash over left lower rib area.  Thorax- left lower ribs mild faint tender to palpation. But lower rib midaxillary one area that is very tender to palpation.        Assessment & Plan:

## 2014-04-02 NOTE — Assessment & Plan Note (Addendum)
Pain directly on palpation of rib. And one area most directly point tender. So appears most likley muscular put possible rib pain. Will get xray of chest and ribs. If signs and symptoms change or worsen let us know. Use your cambria/dicofenac 1 tab po bid.  If you get skin rash or vesicles let us know. If symptoms change or worsen let us know as well.  Any chest pain then ED evaluation.

## 2014-04-02 NOTE — Progress Notes (Signed)
Pre visit review using our clinic review tool, if applicable. No additional management support is needed unless otherwise documented below in the visit note. 

## 2014-04-02 NOTE — Patient Instructions (Signed)
Rib pain on left side Pain directly on palpation of rib. And one area most directly point tender. So appears most likley muscular put possible rib pain. Will get xray of chest and ribs. If signs and symptoms change or worsen let us know. Use your cambria/dicofenac 1 tab po bid.  If you get skin rash or vesicles let us know. If symptoms change or worsen let us know as well.  Any chest pain then ED evaluation.     If any recurrent periods not urinating(which has resolved)notify us and will repeat cmp.  Follow up in 7 days or as needed.

## 2014-04-03 ENCOUNTER — Encounter: Payer: Self-pay | Admitting: Medical

## 2014-04-03 NOTE — Telephone Encounter (Signed)
Response to pt on fx.

## 2014-04-04 ENCOUNTER — Other Ambulatory Visit: Payer: Self-pay | Admitting: Medical

## 2014-04-04 DIAGNOSIS — R0781 Pleurodynia: Secondary | ICD-10-CM

## 2014-04-04 MED ORDER — TRAMADOL HCL 50 MG PO TABS: 50.0000 mg | ORAL_TABLET | Freq: Three times a day (TID) | ORAL | Status: DC | PRN

## 2014-04-07 ENCOUNTER — Other Ambulatory Visit: Payer: Self-pay | Admitting: Family Medicine

## 2014-04-07 NOTE — Telephone Encounter (Signed)
Med filled.  

## 2014-04-08 ENCOUNTER — Ambulatory Visit (HOSPITAL_BASED_OUTPATIENT_CLINIC_OR_DEPARTMENT_OTHER): Payer: Medicare Other

## 2014-04-10 ENCOUNTER — Ambulatory Visit (INDEPENDENT_AMBULATORY_CARE_PROVIDER_SITE_OTHER): Payer: Medicare Other | Admitting: Medical

## 2014-04-10 ENCOUNTER — Encounter: Payer: Self-pay | Admitting: Medical

## 2014-04-10 ENCOUNTER — Ambulatory Visit (HOSPITAL_BASED_OUTPATIENT_CLINIC_OR_DEPARTMENT_OTHER)
Admission: RE | Admit: 2014-04-10 | Discharge: 2014-04-10 | Disposition: A | Discharge status: Home / Self Care | Payer: Medicare Other | Source: Ambulatory Visit | Attending: Medical | Admitting: Medical

## 2014-04-10 VITALS — BP 117/78 | HR 117 | Temp 98.3°F | Ht 62.5 in | Wt 175.2 lb

## 2014-04-10 DIAGNOSIS — M8580 Other specified disorders of bone density and structure, unspecified site: Secondary | ICD-10-CM | POA: Diagnosis not present

## 2014-04-10 DIAGNOSIS — M545 Low back pain: Secondary | ICD-10-CM | POA: Diagnosis not present

## 2014-04-10 DIAGNOSIS — R748 Abnormal levels of other serum enzymes: Secondary | ICD-10-CM | POA: Insufficient documentation

## 2014-04-10 DIAGNOSIS — R0781 Pleurodynia: Secondary | ICD-10-CM

## 2014-04-10 DIAGNOSIS — R0789 Other chest pain: Secondary | ICD-10-CM

## 2014-04-10 DIAGNOSIS — M549 Dorsalgia, unspecified: Secondary | ICD-10-CM | POA: Insufficient documentation

## 2014-04-10 LAB — COMPREHENSIVE METABOLIC PANEL
ALT: 11 U/L (ref 0–35)
AST: 17 U/L (ref 0–37)
Albumin: 3.8 g/dL (ref 3.5–5.2)
Alkaline Phosphatase: 113 U/L (ref 39–117)
BUN: 6 mg/dL (ref 6–23)
CO2: 31 mEq/L (ref 19–32)
Calcium: 9.3 mg/dL (ref 8.4–10.5)
Chloride: 102 mEq/L (ref 96–112)
Creatinine, Ser: 0.64 mg/dL (ref 0.40–1.20)
GFR: 107 mL/min (ref >60.00)
Glucose, Bld: 77 mg/dL (ref 70–99)
Potassium: 3.7 mEq/L (ref 3.5–5.1)
Sodium: 138 mEq/L (ref 135–145)
Total Bilirubin: 0.2 mg/dL (ref 0.2–1.2)
Total Protein: 7 g/dL (ref 6.0–8.3)

## 2014-04-10 MED ORDER — HYDROCODONE-ACETAMINOPHEN 5-325 MG PO TABS: 1.0000 | ORAL_TABLET | Freq: Four times a day (QID) | ORAL | Status: DC | PRN

## 2014-04-10 NOTE — Assessment & Plan Note (Signed)
Had fx. Stop tramdol due to side effect. Rx norco since she used recently and no side effect.

## 2014-04-10 NOTE — Patient Instructions (Addendum)
Back pain Xray of lspine. Will see do work up since onset of left rib fx with no trauma. Some ostepenia on xray.   Rib pain on right side Mild pain. Xray to r/o fx.   Rib pain on left side Had fx. Stop tramdol due to side effect. Rx norco since she used recently and no side effect.   Elevated alkaline phosphatase level Recent fx with no trauma. Will get cmp to see if extreme change.     Follow up as needed or sooner if abnormal xray of work up.  Pt given norco and per pt has used recently no allergic  reaction or side effect. So did rx today.

## 2014-04-10 NOTE — Assessment & Plan Note (Addendum)
Recent fx with no trauma. Will get cmp to see if extreme change.

## 2014-04-10 NOTE — Progress Notes (Signed)
Pre visit review using our clinic review tool, if applicable. No additional management support is needed unless otherwise documented below in the visit note. 

## 2014-04-10 NOTE — Progress Notes (Signed)
Subjective:    Patient ID: Courtney Garza, female    DOB: 03/05/1970, 44 y.o.   MRN: [id number]  HPI   Pt in for follow up on her left lower rib pain. Pain most prevalent when she is sleeping. Pt could not tolerate tramadol. Gave her ha and nausea. Pt took her husband norco 5/325. She had no allergic reaction and no side effects.   No trauma with fracture of the left ribs.(only thing she speculates could have been her 60 pound dog shows on her in am and sometimes front paws hit ribs forcefully)  Pt reports no other obvious area of pain but since no cause found for fracture want palpate other area.   She has some low back pain for months. No radiating features. Also some faint rt rib pain.    Review of Systems  Constitutional: Negative for fever, chills, diaphoresis, activity change and fatigue.  Respiratory: Negative for cough, chest tightness and shortness of breath.   Cardiovascular: Negative for chest pain, palpitations and leg swelling.  Gastrointestinal: Negative for nausea, vomiting and abdominal pain.  Musculoskeletal: Negative for neck pain and neck stiffness.       Lt rib pain still present.  Some faint rt rib pain.  Mild low back pain.(no radicular symptoms)  Neurological: Negative for dizziness, tremors, seizures, syncope, facial asymmetry, speech difficulty, weakness, light-headedness, numbness and headaches.  Psychiatric/Behavioral: Negative for behavioral problems, confusion and agitation. The patient is not nervous/anxious.     Past Medical History  Diagnosis Date  . Migraine   . High cholesterol   . Depression   . Anxiety   . Dysarthria     Left arm  . History of stroke     History   Social History  . Marital Status: Married    Spouse Name: Tammy SoursGreg   . Number of Children: 3  . Years of Education: 10 th   Occupational History  .      Disabled   Social History Main Topics  . Smoking status: Former Smoker    Quit date: 02/22/2011  . Smokeless  tobacco: Never Used     Comment: Quit 2013  . Alcohol Use: No  . Drug Use: Yes    Special: Marijuana  . Sexual Activity: Not on file   Other Topics Concern  . Not on file   Social History Narrative   Patient lives at home with her husband Earl LitesGregory.   Disabled.   Education 10 th   Right handed.   Caffeine six cup of coffee daily.      Past Surgical History  Procedure Laterality Date  . Vaginal hysterectomy    . Appendectomy    . Breast enhancement surgery    . Vena cava filter placement      and removal    Family History  Problem Relation Age of Onset  . Cancer Mother     Allergies  Allergen Reactions  . Acetaminophen Hives  . Fish Allergy     Hives and migraines  . Hydromorphone Other (See Comments)    Could not speak or keep her eyes open  . Migraine Formula  [Aspirin-Acetaminophen-Caffeine] Hives    pt is able to eat shellfish without a problem.   . Ondansetron Nausea And Vomiting  . Duloxetine Hcl Rash    hallucinations    Current Outpatient Prescriptions on File Prior to Visit  Medication Sig Dispense Refill  . albuterol (PROVENTIL HFA;VENTOLIN HFA) 108 (90 BASE) MCG/ACT inhaler Inhale 2 puffs  into the lungs every 6 (six) hours as needed for wheezing or shortness of breath. 1 Inhaler 6  . amitriptyline (ELAVIL) 25 MG tablet Take 2 tablets (50 mg total) by mouth at bedtime. 60 tablet 3  . Ascorbic Acid (VITAMIN C) 1000 MG tablet Take 1,000 mg by mouth daily.    Marland Kitchen aspirin 325 MG tablet Take 325 mg by mouth daily.    Marland Kitchen atorvastatin (LIPITOR) 20 MG tablet Take 1 tablet (20 mg total) by mouth daily. 30 tablet 6  . baclofen (LIORESAL) 10 MG tablet Take 1 tablet in the am, 1 tablet in the afternoon, and 2 tablets in the pm    . cyproheptadine (PERIACTIN) 4 MG tablet TAKE 1 TABLET BY MOUTH THREE TIMES DAILY AS NEEDED 30 tablet 0  . Diclofenac Potassium (CAMBIA) 50 MG PACK Take 1package at earliest onset of headache x1. 1 each 2  . dicyclomine (BENTYL) 20 MG tablet  TAKE 1 TABLET BY MOUTH EVERY 6 HOURS 60 tablet 3  . gabapentin (NEURONTIN) 300 MG capsule Take 300 mg by mouth 2 (two) times daily. Morning, afternoon    . gabapentin (NEURONTIN) 600 MG tablet Take 1 tablet (600 mg total) by mouth at bedtime. 30 tablet 3  . Multiple Vitamins-Minerals (MULTIVITAMIN & MINERAL PO) Take by mouth daily.    Marland Kitchen omeprazole (PRILOSEC) 20 MG capsule Take 1 capsule (20 mg total) by mouth daily. 30 capsule 6  . promethazine (PHENERGAN) 25 MG tablet Take 1 tablet (25 mg total) by mouth every 6 (six) hours as needed for nausea or vomiting. 30 tablet 3  . venlafaxine XR (EFFEXOR-XR) 150 MG 24 hr capsule Take 1 capsule (150 mg total) by mouth daily with breakfast. 30 capsule 6   No current facility-administered medications on file prior to visit.    BP 117/78 mmHg  Pulse 117  Temp(Src) 98.3 F (36.8 C) (Oral)  Ht 5' 2.5" (1.588 m)  Wt 175 lb 3.2 oz (79.47 kg)  BMI 31.51 kg/m2  SpO2 99%        Objective:   Physical Exam   General- No acute distress. Pleasant patient. Neck- Full range of motion, no jvd Lungs- Clear, even and unlabored. Heart- regular rate and rhythm. Neurologic- CNII- XII grossly intact. Lt lower rib- tender to palpation. Midaxillary region. Rt lower ribs and mid- faint tender. Back- faint mid lumbar tenderness to palpation.         Assessment & Plan:

## 2014-04-10 NOTE — Assessment & Plan Note (Signed)
Xray of lspine. Will see do work up since onset of left rib fx with no trauma. Some ostepenia on xray.

## 2014-04-10 NOTE — Assessment & Plan Note (Signed)
Mild pain. Xray to r/o fx.

## 2014-04-18 ENCOUNTER — Ambulatory Visit: Payer: Medicare Other | Admitting: Neurology

## 2014-04-23 ENCOUNTER — Encounter: Payer: Self-pay | Admitting: Family Medicine

## 2014-04-24 ENCOUNTER — Other Ambulatory Visit: Payer: Self-pay | Admitting: *Deleted

## 2014-04-24 ENCOUNTER — Encounter: Payer: Self-pay | Admitting: Neurology

## 2014-04-24 ENCOUNTER — Ambulatory Visit (HOSPITAL_COMMUNITY)
Admission: RE | Admit: 2014-04-24 | Discharge: 2014-04-24 | Disposition: A | Discharge status: Home / Self Care | Payer: Medicare Other | Source: Ambulatory Visit | Attending: Neurology | Admitting: Neurology

## 2014-04-24 ENCOUNTER — Ambulatory Visit (INDEPENDENT_AMBULATORY_CARE_PROVIDER_SITE_OTHER): Payer: Medicare Other | Admitting: Neurology

## 2014-04-24 VITALS — BP 130/80 | HR 130 | Temp 98.2°F | Resp 20 | Ht 62.0 in | Wt 173.9 lb

## 2014-04-24 DIAGNOSIS — G8114 Spastic hemiplegia affecting left nondominant side: Secondary | ICD-10-CM

## 2014-04-24 DIAGNOSIS — R42 Dizziness and giddiness: Secondary | ICD-10-CM

## 2014-04-24 DIAGNOSIS — I6930 Unspecified sequelae of cerebral infarction: Secondary | ICD-10-CM | POA: Diagnosis not present

## 2014-04-24 DIAGNOSIS — I773 Arterial fibromuscular dysplasia: Secondary | ICD-10-CM

## 2014-04-24 DIAGNOSIS — R000 Tachycardia, unspecified: Secondary | ICD-10-CM | POA: Diagnosis not present

## 2014-04-24 DIAGNOSIS — G43009 Migraine without aura, not intractable, without status migrainosus: Secondary | ICD-10-CM

## 2014-04-24 DIAGNOSIS — H9201 Otalgia, right ear: Secondary | ICD-10-CM

## 2014-04-24 DIAGNOSIS — M542 Cervicalgia: Secondary | ICD-10-CM

## 2014-04-24 MED ORDER — AMITRIPTYLINE HCL 25 MG PO TABS: 25.0000 mg | ORAL_TABLET | Freq: Every day | ORAL | Status: DC

## 2014-04-24 NOTE — Progress Notes (Signed)
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  She is accompanied by her husband who provides some history.  UPDATE: She had her first round of Botox for migraine and spastic hemiparesis in January.  She has noted some improvement in both the spasticity and headache. Intensity:  5-6/10 Duration:  Half a day Frequency:  2 days a week  Current abortive therapy:  Cyproheptadine, Cambia Current preventative therapy:  Amitriptyline  (depression), venlafaxine ER  (depression), gabapentin (neuralgia) Other medications: baclofen   She reports a new problem. About 2 months ago, she developed a deep pain in her right ear.  Her hearing is slightly muffled.  She has tinnitus but that is chronic.  No fevers.  About a couple of weeks later, she developed dizziness, described as both lightheadedness and spinning.  She was prescribed antibiotics, nasal spray and steroids, which were ineffective.  The dizziness is constant but worse when she gets up.  She fell on a couple of occasions due to this.  She has longstanding history of palpitations.  Her HR is in the 130s today.  HISTORY: She moved to [place] from [place] over the summer.  She was being followed at the [facility], where she was receiving Botox for her migraines and spasticity.  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  She also was found to have protein C deficiency, protein S deficiency, and antithrombin  deficiency.  The persistent headaches were attributed to the SDH.  She told me last visit she received Botox, which helped the migraines.  Her last round of Botox was in May.  However, reviewing the notes at [facility], she seemed to have been receiving occipital nerve blocks but there is no mention of Botox for her migraines.  She reportedly was getting Botox for her left sided spasticity. Onset:  All her life, however worse since her stroke in 2012 Location:  Right occipital spreading to frontal region Quality:  aching, burning? Initial Intensity:  7-8/10 Aura:  Occasionally "disturbed vision" (cannot focus) Prodrome:  no Associated symptoms:  Nausea, vomiting, photophobia, phonophobia, osmophobia, runny nose Initial Duration:  constant Initial Frequency:  constant Triggers/exacerbating factors:  fish Relieving factors:  none Activity:  Can't function 20 days out of the month.  Past abortive therapy:  Advil/Aleve ineffective. Past preventative therapy:  topiramate (vomiting), Cymbalta ([facility]), Depakote (was only on it briefly, started in hospital but quickly changed as outpatient), Botox (effective), right greater occipital nerve block (effective for 6 weeks) Other past medications:  citalopram, promethazine  Contraindications:  Acetaminophen/Excedrin (hives), triptans (FMD)   Caffeine:  6 cups coffee daily Alcohol:  no Smoker:  former Diet:  healthy Exercise:  Walks daily Depression/stress:  stable Sleep hygiene:  good Family history of headache:  Dad  She had a right hemispheric stroke related to a carotid dissection due to fibromuscular dysplasia in 2012.  She has residual left spastic hemiparesis.  She was bedridden for two months and developed a DVT, requiring an IVC filter.  She reportedly was found to have  protein C and S deficiency.  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  She has established care with Dr. Shawnee Knapp at [facility] for her FMD.  She takes ASA  daily.  PAST MEDICAL HISTORY: Past Medical History  Diagnosis Date  . Migraine   . High cholesterol   . Depression   . Anxiety   . Dysarthria     Left arm  . History of stroke     MEDICATIONS: Current Outpatient Prescriptions on File Prior to Visit  Medication Sig Dispense Refill  . albuterol (PROVENTIL HFA;VENTOLIN HFA) 108 (90 BASE) MCG/ACT inhaler Inhale 2 puffs into the lungs every 6 (six) hours as needed for wheezing or shortness of breath. 1 Inhaler 6  . Ascorbic Acid (VITAMIN C) 1000 MG tablet Take 1,000 mg by mouth daily.    Marland Kitchen aspirin 325 MG tablet Take 325 mg by mouth daily.    Marland Kitchen atorvastatin (LIPITOR) 20 MG tablet Take 1 tablet (20 mg total) by mouth daily. 30 tablet 6  . baclofen (LIORESAL) 10 MG tablet Take 1 tablet in the am, 1 tablet in the afternoon, and 2 tablets in the pm    . Cholecalciferol (VITAMIN D3) 2000 UNITS TABS Take 2,000 Units by mouth.    . cyproheptadine (PERIACTIN) 4 MG tablet TAKE 1 TABLET BY MOUTH THREE TIMES DAILY AS NEEDED 30 tablet 0  . Diclofenac Potassium (CAMBIA) 50 MG PACK Take 1package at earliest onset of headache x1. 1 each 2  . dicyclomine (BENTYL) 20 MG tablet TAKE 1 TABLET BY MOUTH EVERY 6 HOURS 60 tablet 3  . gabapentin (NEURONTIN) 300 MG capsule Take 300 mg by mouth 2 (two) times daily. Morning, afternoon    . gabapentin (NEURONTIN) 600 MG tablet Take 1 tablet (600 mg total) by mouth at bedtime. 30 tablet 3  . HYDROcodone-acetaminophen (NORCO) 5-325 MG per tablet Take 1 tablet by mouth every 6 (six) hours as needed for moderate pain. 30 tablet 0  . Multiple Vitamins-Minerals (MULTIVITAMIN & MINERAL PO) Take by mouth daily.    Marland Kitchen omeprazole (PRILOSEC) 20 MG capsule Take 1 capsule (20 mg total) by mouth daily. 30 capsule 6  . promethazine (PHENERGAN) 25 MG tablet Take 1 tablet (25 mg total) by  mouth every 6 (six) hours as needed for nausea or vomiting. 30 tablet 3  . venlafaxine XR (EFFEXOR-XR) 150 MG 24 hr capsule Take 1 capsule (150 mg total) by mouth daily with breakfast. 30 capsule 6   No current facility-administered medications on file prior to visit.    ALLERGIES: Allergies  Allergen Reactions  . Acetaminophen Hives  . Fish Allergy     Hives and migraines  . Hydromorphone Other (See Comments)    Could not speak or keep her eyes open  . Migraine Formula  [Aspirin-Acetaminophen-Caffeine] Hives    pt is able to eat shellfish without a problem.   . Ondansetron Nausea And Vomiting  . Duloxetine Hcl Rash    hallucinations    FAMILY HISTORY: Family History  Problem Relation Age of Onset  . Cancer Mother     SOCIAL HISTORY: History   Social History  . Marital Status: Married    Spouse Name: [name]   . Number of Children: 3  . Years of Education: 10 th   Occupational History  .      Disabled  Social History Main Topics  . Smoking status: Former Smoker    Quit date: 02/22/2011  . Smokeless tobacco: Never Used     Comment: Quit 2013  . Alcohol Use: No  . Drug Use: Yes    Special: Marijuana  . Sexual Activity: Not on file   Other Topics Concern  . Not on file   Social History Narrative   Patient lives at home with her husband Earl LitesGregory.   Disabled.   Education 10 th   Right handed.   Caffeine six cup of coffee daily.      REVIEW OF SYSTEMS: Constitutional: No fevers, chills, or sweats, no generalized fatigue, change in appetite Eyes: No visual changes, double vision, eye pain Ear, nose and throat: No hearing loss, ear pain, nasal congestion, sore throat Cardiovascular: No chest pain, palpitations Respiratory:  No shortness of breath at rest or with exertion, wheezes GastrointestinaI: No nausea, vomiting, diarrhea, abdominal pain, fecal incontinence Genitourinary:  No dysuria, urinary retention or frequency Musculoskeletal:  No neck pain, back  pain Integumentary: No rash, pruritus, skin lesions Neurological: as above Psychiatric: No depression, insomnia, anxiety Endocrine: No palpitations, fatigue, diaphoresis, mood swings, change in appetite, change in weight, increased thirst Hematologic/Lymphatic:  No anemia, purpura, petechiae. Allergic/Immunologic: no itchy/runny eyes, nasal congestion, recent allergic reactions, rashes  PHYSICAL EXAM: Filed Vitals:   04/24/14 1131  BP: 130/80  Pulse: 130  Temp: 98.2 F (36.8 C)  Resp: 20   General: No acute distress Head:  Normocephalic/atraumatic Eyes:  Fundoscopic exam unremarkable without vessel changes, exudates, hemorrhages or papilledema. Neck: supple, no paraspinal tenderness, full range of motion Heart:  Regular rate and rhythm Lungs:  Clear to auscultation bilaterally Back: No paraspinal tenderness Neurological Exam: alert and oriented to person, place, and time, recent and remote memory intact, fund of knowledge intact, attention and concentration intact, speech fluent and not dysarthric, language intact.  Bilateral peripheral vision loss, left lower facial weakness, left shoulder shrug weakness, otherwise CN II-XII intact.  Left upper extremity spasticity and increased tone in left lower extremity.  Left upper extremity plegic with slight flexion of forearm, wrist and fingers.  Left quadriceps is 5-/5.  Strength 5/5 on right.   Pinprick and vibration intact.  Deep tendon reflexes 3+ on left, 2+ on right.  Left circumduction  IMPRESSION: Chronic migraine without aura, much improved Fibromuscular dysplasia History of right hemispheric infarction secondary to carotid dissection with residual left spastic hemiparesis Dizziness, possibly inner ear issue but with history of FMD and neck pain, must rule out a dissection Tinnitus Neck pain. Tachycardia.  She has had this on and off for many years.  Consider related to amitriptyline  PLAN: 1.  Will check MRI of brain and IAC,  MRA of head and neck 2.  Decrease amitriptyline to 25mg  and check EKG and check QT prolongation 3.  Continue baclofen 10mg  in AM, 10mg  noon and 20mg  in PM.  May take another dose for neck pain if needed. 4.  Follow up in 6 weeks for next round of Botox  30 minutes spent with patient, over 50% spent discussing symptoms, possible causes and plan.  Shon MilletAdam Jaffe, DO  CC:  Neena RhymesKatherine Tabori, MD

## 2014-04-24 NOTE — Patient Instructions (Addendum)
1.  Decrease amitriptyline back to 25mg  at bedtime 2.  Will get EKG [facility]   [address] main tower  [address]  2PM today 04/24/14 3.  Will check MRI of brain and internal auditory canals with [facility]  05/14/14 2:45pm 4.  Follow up in 6 weeks for botox 5.  May take extra baclofen for when the neck pain is worse

## 2014-05-01 ENCOUNTER — Other Ambulatory Visit: Payer: Self-pay | Admitting: Family Medicine

## 2014-05-01 MED ORDER — CYPROHEPTADINE HCL 4 MG PO TABS: 4.0000 mg | ORAL_TABLET | Freq: Three times a day (TID) | ORAL | Status: DC | PRN

## 2014-05-01 NOTE — Telephone Encounter (Signed)
Last OV 03/27/14 Periactin last filled 12-24-13 #30 with 0

## 2014-05-01 NOTE — Telephone Encounter (Signed)
Med filled.  

## 2014-05-07 ENCOUNTER — Encounter: Payer: Self-pay | Admitting: Family Medicine

## 2014-05-07 ENCOUNTER — Ambulatory Visit (HOSPITAL_BASED_OUTPATIENT_CLINIC_OR_DEPARTMENT_OTHER)
Admission: RE | Admit: 2014-05-07 | Discharge: 2014-05-07 | Disposition: A | Discharge status: Home / Self Care | Payer: Medicare Other | Source: Ambulatory Visit | Attending: Family Medicine | Admitting: Family Medicine

## 2014-05-07 ENCOUNTER — Ambulatory Visit (INDEPENDENT_AMBULATORY_CARE_PROVIDER_SITE_OTHER): Payer: Medicare Other | Admitting: Family Medicine

## 2014-05-07 VITALS — BP 130/82 | HR 112 | Temp 98.1°F | Resp 16 | Wt 168.5 lb

## 2014-05-07 DIAGNOSIS — S6992XA Unspecified injury of left wrist, hand and finger(s), initial encounter: Secondary | ICD-10-CM | POA: Diagnosis not present

## 2014-05-07 DIAGNOSIS — H43392 Other vitreous opacities, left eye: Secondary | ICD-10-CM | POA: Diagnosis not present

## 2014-05-07 DIAGNOSIS — H6981 Other specified disorders of Eustachian tube, right ear: Secondary | ICD-10-CM | POA: Diagnosis not present

## 2014-05-07 DIAGNOSIS — M19032 Primary osteoarthritis, left wrist: Secondary | ICD-10-CM | POA: Diagnosis not present

## 2014-05-07 DIAGNOSIS — M7989 Other specified soft tissue disorders: Secondary | ICD-10-CM | POA: Diagnosis not present

## 2014-05-07 DIAGNOSIS — M25432 Effusion, left wrist: Secondary | ICD-10-CM | POA: Diagnosis not present

## 2014-05-07 DIAGNOSIS — M25532 Pain in left wrist: Secondary | ICD-10-CM | POA: Diagnosis not present

## 2014-05-07 DIAGNOSIS — W19XXXA Unspecified fall, initial encounter: Secondary | ICD-10-CM | POA: Diagnosis not present

## 2014-05-07 MED ORDER — HYDROCODONE-ACETAMINOPHEN 5-325 MG PO TABS: 1.0000 | ORAL_TABLET | Freq: Four times a day (QID) | ORAL | Status: DC | PRN

## 2014-05-07 NOTE — Progress Notes (Signed)
   Subjective:    Patient ID: Courtney Garza, female    DOB: 11/21/1970, 44 y.o.   MRN: [id number]  HPI Dizziness w/ R ear discomfort- pt has seen neuro and discussed the dizziness but now having R ear pain.  Denies vertigo.  Has had falls resulting in injury.  Floaters in eyes- pt reports seeing spots, 'like gnats in front of you'.  sxs started 'a couple of weeks ago'.  Last eye exam was nearly 1 yr ago.  L wrist pain- woke pt from sleep, sxs started 'a couple of days ago'.  Pt has had recent fall but isn't sure whether she landed on her arm.  Pt has hx of broken arm.  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   Review of Systems For ROS see HPI     Objective:   Physical Exam  Constitutional: She appears well-developed and well-nourished. No distress.  HENT:  Head: Normocephalic and atraumatic.  Right Ear: Tympanic membrane normal.  Left Ear: Tympanic membrane normal.  Nose: Mucosal edema and rhinorrhea present. Right sinus exhibits no maxillary sinus tenderness and no frontal sinus tenderness. Left sinus exhibits no maxillary sinus tenderness and no frontal sinus tenderness.  Mouth/Throat: Mucous membranes are normal. Posterior oropharyngeal erythema (w/ PND) present.  Eyes: Conjunctivae and EOM are normal. Pupils are equal, round, and reactive to light.  Neck: Normal range of motion. Neck supple.  Cardiovascular: Normal rate, regular rhythm, normal heart sounds and intact distal pulses.   Pulmonary/Chest: Effort normal and breath sounds normal. No respiratory distress. She has no wheezes. She has no rales.  Musculoskeletal: She exhibits edema (mild swelling over L wrist deformity on radial aspect) and tenderness (over L wrist deformity).  Lymphadenopathy:    She has no cervical adenopathy.  Vitals reviewed.         Assessment & Plan:

## 2014-05-07 NOTE — Progress Notes (Signed)
Pre visit review using our clinic review tool, if applicable. No additional management support is needed unless otherwise documented below in the visit note. 

## 2014-05-07 NOTE — Patient Instructions (Signed)
Go downstairs and get your xray- we'll notify you of your results and determine the next steps Start OTC Claritin or Zyrtec to decrease the allergy congestion and pressure We'll call you with your eye exam Call with any questions or concerns Hang in there!!!

## 2014-05-08 ENCOUNTER — Telehealth: Payer: Self-pay | Admitting: Family Medicine

## 2014-05-08 ENCOUNTER — Encounter: Payer: Self-pay | Admitting: Family Medicine

## 2014-05-08 MED ORDER — MELOXICAM 15 MG PO TABS: 15.0000 mg | ORAL_TABLET | Freq: Every day | ORAL | Status: DC

## 2014-05-08 NOTE — Telephone Encounter (Signed)
Caller name: cheryl with [facility]  Relation to pt: Call back number: [phone] Pharmacy:  Reason for call:   Patient rescheduled her appointment with them from today to tomorrow. Just FYI. [employer]

## 2014-05-08 NOTE — Telephone Encounter (Signed)
Noted  

## 2014-05-09 DIAGNOSIS — H2513 Age-related nuclear cataract, bilateral: Secondary | ICD-10-CM | POA: Diagnosis not present

## 2014-05-09 DIAGNOSIS — H43393 Other vitreous opacities, bilateral: Secondary | ICD-10-CM | POA: Diagnosis not present

## 2014-05-09 DIAGNOSIS — H11153 Pinguecula, bilateral: Secondary | ICD-10-CM | POA: Diagnosis not present

## 2014-05-14 ENCOUNTER — Ambulatory Visit (HOSPITAL_COMMUNITY)
Admission: RE | Admit: 2014-05-14 | Discharge: 2014-05-14 | Disposition: A | Discharge status: Home / Self Care | Payer: Medicare Other | Source: Ambulatory Visit | Attending: Neurology | Admitting: Neurology

## 2014-05-14 ENCOUNTER — Ambulatory Visit (HOSPITAL_COMMUNITY): Admission: RE | Admit: 2014-05-14 | Payer: Medicare Other | Source: Ambulatory Visit

## 2014-05-14 ENCOUNTER — Ambulatory Visit: Payer: Medicare Other | Admitting: Family Medicine

## 2014-05-14 DIAGNOSIS — H9319 Tinnitus, unspecified ear: Secondary | ICD-10-CM | POA: Insufficient documentation

## 2014-05-14 DIAGNOSIS — R000 Tachycardia, unspecified: Secondary | ICD-10-CM

## 2014-05-14 DIAGNOSIS — I69354 Hemiplegia and hemiparesis following cerebral infarction affecting left non-dominant side: Secondary | ICD-10-CM | POA: Insufficient documentation

## 2014-05-14 DIAGNOSIS — I773 Arterial fibromuscular dysplasia: Secondary | ICD-10-CM | POA: Diagnosis not present

## 2014-05-14 DIAGNOSIS — M542 Cervicalgia: Secondary | ICD-10-CM | POA: Diagnosis not present

## 2014-05-14 DIAGNOSIS — G43909 Migraine, unspecified, not intractable, without status migrainosus: Secondary | ICD-10-CM | POA: Insufficient documentation

## 2014-05-14 DIAGNOSIS — Q283 Other malformations of cerebral vessels: Secondary | ICD-10-CM | POA: Insufficient documentation

## 2014-05-14 DIAGNOSIS — R42 Dizziness and giddiness: Secondary | ICD-10-CM

## 2014-05-14 DIAGNOSIS — I63511 Cerebral infarction due to unspecified occlusion or stenosis of right middle cerebral artery: Secondary | ICD-10-CM | POA: Diagnosis not present

## 2014-05-14 DIAGNOSIS — H9201 Otalgia, right ear: Secondary | ICD-10-CM

## 2014-05-14 DIAGNOSIS — I7771 Dissection of carotid artery: Secondary | ICD-10-CM | POA: Insufficient documentation

## 2014-05-14 DIAGNOSIS — G43009 Migraine without aura, not intractable, without status migrainosus: Secondary | ICD-10-CM

## 2014-05-14 DIAGNOSIS — G8114 Spastic hemiplegia affecting left nondominant side: Secondary | ICD-10-CM

## 2014-05-14 DIAGNOSIS — I6930 Unspecified sequelae of cerebral infarction: Secondary | ICD-10-CM

## 2014-05-14 MED ORDER — GADOBENATE DIMEGLUMINE 529 MG/ML IV SOLN
16.0000 mL | Freq: Once | INTRAVENOUS | Status: AC | PRN
  Administered 2014-05-14: 16 mL via INTRAVENOUS

## 2014-05-15 ENCOUNTER — Telehealth: Payer: Self-pay | Admitting: *Deleted

## 2014-05-15 NOTE — Telephone Encounter (Signed)
Patient is aware of MRI and MRA results

## 2014-05-15 NOTE — Telephone Encounter (Signed)
-----   Message from Drema DallasAdam R Jaffe, DO sent at 05/15/2014  6:54 AM EDT ----- MRIs and MRAs reveal no new stroke or dissection ----- Message -----    From: Rad Results In Interface    Sent: 05/14/2014   8:59 PM      To: Drema DallasAdam R Jaffe, DO

## 2014-05-20 NOTE — Assessment & Plan Note (Signed)
New to provider.  Pt has hx of previous injury at this site that may not have healed properly.  Pt is unclear if she re-injured wrist during one of her recent falls but that is my suspicion.  She has swelling and deformity of L wrist.  Will get imaging and determine the next steps once available.  Pt to use available pain meds prn.  Pt expressed understanding and is in agreement w/ plan.

## 2014-05-20 NOTE — Assessment & Plan Note (Signed)
New.  Will refer pt to ophthalmology for complete evaluation ASAP.  Pt expressed understanding and is in agreement w/ plan.

## 2014-05-20 NOTE — Assessment & Plan Note (Signed)
New.  Suspect this is the cause of pt's L ear pain and is due to her untreated seasonal allergies.  Will have pt start OTC antihistamine and if no improvement in ear pain, will refer to ENT.  Pt expressed understanding and is in agreement w/ plan.

## 2014-05-21 ENCOUNTER — Ambulatory Visit: Payer: Medicare Other | Admitting: Family Medicine

## 2014-05-22 ENCOUNTER — Other Ambulatory Visit: Payer: Self-pay | Admitting: Family Medicine

## 2014-05-22 NOTE — Telephone Encounter (Signed)
Med filled.  

## 2014-05-27 ENCOUNTER — Encounter: Payer: Self-pay | Admitting: Neurology

## 2014-05-30 ENCOUNTER — Encounter: Payer: Self-pay | Admitting: Family Medicine

## 2014-05-30 MED ORDER — GABAPENTIN 300 MG PO CAPS: 300.0000 mg | ORAL_CAPSULE | Freq: Two times a day (BID) | ORAL | Status: DC

## 2014-05-30 NOTE — Telephone Encounter (Signed)
Med filled.  

## 2014-06-03 ENCOUNTER — Encounter: Payer: Self-pay | Admitting: Family Medicine

## 2014-06-03 ENCOUNTER — Ambulatory Visit (INDEPENDENT_AMBULATORY_CARE_PROVIDER_SITE_OTHER): Payer: Medicare Other | Admitting: Family Medicine

## 2014-06-03 ENCOUNTER — Telehealth: Payer: Self-pay | Admitting: Family Medicine

## 2014-06-03 VITALS — BP 112/70 | HR 120 | Temp 98.4°F | Resp 16 | Wt 170.5 lb

## 2014-06-03 DIAGNOSIS — J45901 Unspecified asthma with (acute) exacerbation: Secondary | ICD-10-CM | POA: Insufficient documentation

## 2014-06-03 DIAGNOSIS — J4531 Mild persistent asthma with (acute) exacerbation: Secondary | ICD-10-CM | POA: Diagnosis not present

## 2014-06-03 DIAGNOSIS — J069 Acute upper respiratory infection, unspecified: Secondary | ICD-10-CM | POA: Diagnosis not present

## 2014-06-03 MED ORDER — CETIRIZINE HCL 10 MG PO TABS: 10.0000 mg | ORAL_TABLET | Freq: Every day | ORAL | Status: DC

## 2014-06-03 MED ORDER — IPRATROPIUM-ALBUTEROL 0.5-2.5 (3) MG/3ML IN SOLN
3.0000 mL | Freq: Once | RESPIRATORY_TRACT | Status: AC
  Administered 2014-06-03: 3 mL via RESPIRATORY_TRACT

## 2014-06-03 MED ORDER — PROMETHAZINE-DM 6.25-15 MG/5ML PO SYRP: 5.0000 mL | ORAL_SOLUTION | Freq: Four times a day (QID) | ORAL | Status: DC | PRN

## 2014-06-03 NOTE — Telephone Encounter (Signed)
Can switch to Cheratussin cough syrup (although not sure this is covered either)

## 2014-06-03 NOTE — Telephone Encounter (Signed)
Caller name: Mr. Joanne GavelSutton Relationship to patient: Husband Can be reached: [phone] [facility] [address]  Reason for call: Insurance does not cover promethazine-dextromethorphan (PROMETHAZINE-DM) 6.25-15 MG/5ML syrup - requesting a different RX be called in for cough

## 2014-06-03 NOTE — Progress Notes (Signed)
   Subjective:    Patient ID: Courtney Garza, female    DOB: 10/09/1970, 44 y.o.   MRN: [id number]  HPI URI- pt reports sore throat, increased chest tightness, dry cough.  No fevers.  sxs started 'a couple of days ago'.  No sinus pain/pressure.  R ear pain.  Denies nasal congestion and PND.  No known sick contacts.  Denies SOB.  Pt has hx of asthma and has been using albuterol- which would explain elevated pulse.   Review of Systems For ROS see HPI     Objective:   Physical Exam  Constitutional: She is oriented to person, place, and time. She appears well-developed and well-nourished. No distress.  HENT:  Head: Normocephalic and atraumatic.  TMs normal bilaterally Mild nasal congestion Throat w/out erythema, edema, or exudate  Eyes: Conjunctivae and EOM are normal. Pupils are equal, round, and reactive to light.  Neck: Normal range of motion. Neck supple.  Cardiovascular: Regular rhythm, normal heart sounds and intact distal pulses.   No murmur heard. Tachy but regular due to albuterol use  Pulmonary/Chest: Effort normal. No respiratory distress. She has no wheezes.  + hacking cough Decreased air movement throughout- this improved s/p duoneb  Lymphadenopathy:    She has no cervical adenopathy.  Neurological: She is alert and oriented to person, place, and time.  Skin: Skin is warm and dry.  Psychiatric: She has a normal mood and affect. Her behavior is normal. Thought content normal.  Vitals reviewed.         Assessment & Plan:

## 2014-06-03 NOTE — Assessment & Plan Note (Signed)
New.  Pt's air movement and breath sounds improved s/p duoneb in office.  No wheezes on PE so no need for prednisone or abx at this time.  Pt to continue albuterol prn.  Reviewed supportive care and red flags that should prompt return.  Pt expressed understanding and is in agreement w/ plan.

## 2014-06-03 NOTE — Progress Notes (Signed)
Pre visit review using our clinic review tool, if applicable. No additional management support is needed unless otherwise documented below in the visit note. 

## 2014-06-03 NOTE — Patient Instructions (Signed)
Follow up as needed Continue the albuterol as needed Start Zyrtec daily Drink plenty of fluids REST! Change positions slowly to avoid BP dropping Call with any questions or concerns- particularly if worsening or not improving Hang in there!

## 2014-06-03 NOTE — Assessment & Plan Note (Signed)
New.  No evidence of bacterial infxn on PE.  Suspect this is viral/allergy combo.  No need for abx at this time but pt instructed to call if sxs are worsening or not improving.  Pt to start daily antihistamine- script sent.  Reviewed supportive care and red flags that should prompt return.  Pt expressed understanding and is in agreement w/ plan.

## 2014-06-04 MED ORDER — GUAIFENESIN-CODEINE 100-10 MG/5ML PO SYRP: ORAL_SOLUTION | ORAL | Status: DC

## 2014-06-04 NOTE — Telephone Encounter (Signed)
Med filled and faxed.  

## 2014-06-12 ENCOUNTER — Ambulatory Visit (INDEPENDENT_AMBULATORY_CARE_PROVIDER_SITE_OTHER): Payer: Medicare Other | Admitting: Neurology

## 2014-06-12 DIAGNOSIS — G8110 Spastic hemiplegia affecting unspecified side: Secondary | ICD-10-CM | POA: Diagnosis not present

## 2014-06-12 DIAGNOSIS — G8114 Spastic hemiplegia affecting left nondominant side: Secondary | ICD-10-CM | POA: Diagnosis not present

## 2014-06-12 DIAGNOSIS — G43709 Chronic migraine without aura, not intractable, without status migrainosus: Secondary | ICD-10-CM | POA: Diagnosis not present

## 2014-06-12 DIAGNOSIS — IMO0002 Reserved for concepts with insufficient information to code with codable children: Secondary | ICD-10-CM

## 2014-06-12 MED ORDER — ONABOTULINUMTOXINA 200 UNITS IJ SOLR
200.0000 [IU] | Freq: Once | INTRAMUSCULAR | Status: AC
  Administered 2014-06-12: 200 [IU] via INTRAMUSCULAR

## 2014-06-12 MED ORDER — ONABOTULINUMTOXINA 100 UNITS IJ SOLR
150.0000 [IU] | Freq: Once | INTRAMUSCULAR | Status: AC
  Administered 2014-06-12: 150 [IU] via INTRAMUSCULAR

## 2014-06-12 NOTE — Progress Notes (Signed)
See Botox procedure note 

## 2014-06-12 NOTE — Procedures (Signed)
[facility]   Procedure Note Botox  Attending: Dr. Shon Millet  I. 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 Risk discussed included, but were not limited pain and discomfort, bleeding, bruising, excessive weakness, venous thrombosis, muscle atrophy and dysphagia. A copy of the patient medication guide was given to the patient which explains the blackbox warning.  Patients identity and treatment sites confirmed Yes. .  Details of Procedure: Skin was cleaned with alcohol. A hollow monopolor needle was introduced to the target muscle. Prior to injection, the needle plunger was aspirated to make sure the needle was not within a blood vessel. There was no blood retrieved on aspiration.   Following is a summary of the muscles injected And the amount of Botulinum toxin used:   Dilution 0.9% preservative free saline mixed with 100 u Botox type A to make 10 U per 0.1cc  Injections  Left flexor digitorum profundus 50 units (total of 3 sites) Left flexor digitorum superficialis 50 units (total of 3 sites) Left flexor carpi ulnaris 25 units (total of 1 site) Left flexor carpi radialis 25 units (total of 1 site) TOTAL UNITS: 150 units  Agent: Botulinum Type A ( Onobotulinum Toxin type A ). 2 vials of Botox were used, each containing 100 units and freshly diluted with 1 mL of sterile, non-preserved saline  Total injected (Units): 150 units Total wasted (Units): none wasted   Pt tolerated procedure well without complications.  Reinjection is anticipated in 3 months.   II. Preoperative Diagnosis(es): Chronic migraine  Consent obtained from: The patient Benefits discussed included, but were not limited to decreased muscle tightness, increased joint  range of motion, and decreased pain. Risk discussed included, but were not limited pain and discomfort, bleeding, bruising, excessive weakness, venous thrombosis, muscle atrophy and dysphagia. A copy of the patient medication guide was given to the patient which explains the blackbox warning.  Patients identity and treatment sites confirmed Yes. .  Details of Procedure: Skin was cleaned with alcohol. Prior to injection, the needle plunger was aspirated to make sure the needle was not within a blood vessel. There was no blood retrieved on aspiration.   Following is a summary of the muscles injected And the amount of Botulinum toxin used:  Dilution 200 units were reconstituted with 4 mL 0.9% sodium chloride.  Skin was cleaned with alcohol prior to injection.  A 30-gauge, 0.5-inch needle was used.  Total:  4 mL (200 units)  # of injection sites Muscle   Dose Total 1     Corrugator   0.1 mL (5 units) 1    Procerus   0.2 mL (10 units) 2 each side  Frontalis  0.4 mL (20 units) 4 each side  Temporalis  0.8 mL (40 units) 3 each side  Occipitalis  0.6 mL (30 units) 2 each side  Cervical paraspinals 0.4 mL (20 units) 3 each side  Trapezius  0.6 mL (30 units)    Total 31  Total used  155 units    Total wasted  45 units   Agent: Botulinum Type A ( Onobotulinum Toxin type A ). 3 vials of Botox were used, each containing 50 units and freshly diluted with 2 mL of sterile, non-perserved saline  Total injected (Units): 150 units Total wasted (Units): none wasted   Pt tolerated procedure well without complications.  Reinjection is anticipated in 3 months.

## 2014-06-19 ENCOUNTER — Other Ambulatory Visit: Payer: Self-pay | Admitting: Family Medicine

## 2014-06-19 NOTE — Telephone Encounter (Signed)
Med filled.  

## 2014-06-25 DIAGNOSIS — H43393 Other vitreous opacities, bilateral: Secondary | ICD-10-CM | POA: Diagnosis not present

## 2014-06-25 DIAGNOSIS — H524 Presbyopia: Secondary | ICD-10-CM | POA: Diagnosis not present

## 2014-06-25 DIAGNOSIS — H2513 Age-related nuclear cataract, bilateral: Secondary | ICD-10-CM | POA: Diagnosis not present

## 2014-06-25 DIAGNOSIS — H43812 Vitreous degeneration, left eye: Secondary | ICD-10-CM | POA: Diagnosis not present

## 2014-06-27 ENCOUNTER — Other Ambulatory Visit: Payer: Self-pay | Admitting: Family Medicine

## 2014-06-27 ENCOUNTER — Encounter: Payer: Self-pay | Admitting: Family Medicine

## 2014-06-27 ENCOUNTER — Ambulatory Visit (INDEPENDENT_AMBULATORY_CARE_PROVIDER_SITE_OTHER): Payer: Medicare Other | Admitting: Family Medicine

## 2014-06-27 VITALS — BP 120/76 | HR 121 | Temp 98.0°F | Resp 16 | Wt 170.0 lb

## 2014-06-27 DIAGNOSIS — M5410 Radiculopathy, site unspecified: Secondary | ICD-10-CM

## 2014-06-27 DIAGNOSIS — M5416 Radiculopathy, lumbar region: Secondary | ICD-10-CM | POA: Insufficient documentation

## 2014-06-27 MED ORDER — PREDNISONE 10 MG PO TABS: ORAL_TABLET | ORAL | Status: DC

## 2014-06-27 MED ORDER — HYDROCODONE-ACETAMINOPHEN 5-325 MG PO TABS: 1.0000 | ORAL_TABLET | Freq: Four times a day (QID) | ORAL | Status: DC | PRN

## 2014-06-27 NOTE — Patient Instructions (Signed)
Follow up as needed Start the Prednisone taper as directed HEAT! Use the Norco as needed for severe pain Call with any questions or concerns Hang in there!!!

## 2014-06-27 NOTE — Assessment & Plan Note (Signed)
New.  Pt has hx of similar but typically resolves within hours- not days.  No bony tenderness, but has L paraspinal muscle spasm w/ TTP.  (-) SLR bilaterally.  Start pred taper.  Refill on Norco to use prn.  Reviewed supportive care and red flags that should prompt return.  If no improvement will need ortho referral.  Pt expressed understanding and is in agreement w/ plan.

## 2014-06-27 NOTE — Progress Notes (Signed)
   Subjective:    Patient ID: Courtney Garza, female    DOB: 09/24/1970, 44 y.o.   MRN: [id number]  HPI Radicular low back pain- L sided, started after vacuuming.  Hx of similar but typically resolves within a few hours.  sxs started ~1 week ago.  Pain will start in L lower back and radiate into buttock, hip, down thigh and into foot.   Review of Systems For ROS see HPI     Objective:   Physical Exam  Constitutional: She is oriented to person, place, and time. She appears well-developed and well-nourished. No distress.  HENT:  Head: Normocephalic and atraumatic.  Cardiovascular: Intact distal pulses.   Musculoskeletal: She exhibits tenderness. Edema: TTP over L buttock w/ spasm, no bony tenderness.  Neurological: She is alert and oriented to person, place, and time.  (-) SLR bilaterally  Skin: Skin is warm and dry.  Vitals reviewed.         Assessment & Plan:

## 2014-06-27 NOTE — Progress Notes (Signed)
Pre visit review using our clinic review tool, if applicable. No additional management support is needed unless otherwise documented below in the visit note. 

## 2014-06-30 NOTE — Telephone Encounter (Signed)
Med filled.  

## 2014-07-08 ENCOUNTER — Encounter: Payer: Self-pay | Admitting: General Practice

## 2014-07-08 ENCOUNTER — Encounter: Payer: Self-pay | Admitting: Family Medicine

## 2014-07-08 ENCOUNTER — Ambulatory Visit (INDEPENDENT_AMBULATORY_CARE_PROVIDER_SITE_OTHER): Payer: Medicare Other | Admitting: Family Medicine

## 2014-07-08 VITALS — BP 136/86 | HR 124 | Temp 98.1°F | Resp 16 | Wt 171.5 lb

## 2014-07-08 DIAGNOSIS — R82998 Other abnormal findings in urine: Secondary | ICD-10-CM

## 2014-07-08 DIAGNOSIS — R1030 Lower abdominal pain, unspecified: Secondary | ICD-10-CM

## 2014-07-08 DIAGNOSIS — R8290 Unspecified abnormal findings in urine: Secondary | ICD-10-CM

## 2014-07-08 DIAGNOSIS — M545 Low back pain, unspecified: Secondary | ICD-10-CM

## 2014-07-08 DIAGNOSIS — N390 Urinary tract infection, site not specified: Secondary | ICD-10-CM

## 2014-07-08 DIAGNOSIS — Z79891 Long term (current) use of opiate analgesic: Secondary | ICD-10-CM | POA: Diagnosis not present

## 2014-07-08 LAB — POCT URINALYSIS DIPSTICK
Bilirubin, UA: NEGATIVE
Blood, UA: NEGATIVE
Glucose, UA: NEGATIVE
Ketones, UA: NEGATIVE
Nitrite, UA: NEGATIVE
Protein, UA: NEGATIVE
Spec Grav, UA: 1.015
Urobilinogen, UA: NEGATIVE
pH, UA: 6

## 2014-07-08 MED ORDER — PROMETHAZINE HCL 25 MG PO TABS: 25.0000 mg | ORAL_TABLET | Freq: Four times a day (QID) | ORAL | Status: DC | PRN

## 2014-07-08 MED ORDER — CIPROFLOXACIN HCL 500 MG PO TABS: 500.0000 mg | ORAL_TABLET | Freq: Two times a day (BID) | ORAL | Status: DC

## 2014-07-08 MED ORDER — HYDROCODONE-ACETAMINOPHEN 5-325 MG PO TABS: 1.0000 | ORAL_TABLET | Freq: Four times a day (QID) | ORAL | Status: DC | PRN

## 2014-07-08 NOTE — Progress Notes (Signed)
   Subjective:    Patient ID: Courtney Garza, female    DOB: 08/28/1970, 44 y.o.   MRN: [id number]  HPI Back pain- LBP radiating around to stomach.  sxs started 2 days ago.  Denies frequency of urination, urgency.  + nausea- 'extreme'.  No change in activity level.  Pain is radiating up into L shoulder blade.  Increased sweating.  Pt took hydrocodone, baclofen, and valium last night- 'it didn't touch it'.   Review of Systems For ROS see HPI     Objective:   Physical Exam  Constitutional: She is oriented to person, place, and time. She appears well-developed and well-nourished. No distress.  HENT:  Head: Normocephalic and atraumatic.  Cardiovascular: Intact distal pulses.   Abdominal: Soft. Bowel sounds are normal. She exhibits no distension. There is tenderness (mild suprapubic tenderness, no CVA tenderness). There is no rebound and no guarding.  Musculoskeletal: She exhibits tenderness (mild TTP over lumbar paraspinal muscles). She exhibits no edema.  Neurological: She is alert and oriented to person, place, and time.  Skin: Skin is warm and dry.  Psychiatric: She has a normal mood and affect. Her behavior is normal. Thought content normal.  Vitals reviewed.         Assessment & Plan:

## 2014-07-08 NOTE — Assessment & Plan Note (Signed)
Chronic problem for pt.  Discussed that if pain was musculoskeletal, it would likely have improved w/ valium, hydrocodone, and baclofen.  Suspect that this is due to her current UTI.  Will treat for infxn and if pain persists, pt will need ortho referral.  Refill hydrocodone today.  Asked pt why she was out after 10 days- she reports she gave husband some of her medication.  Made it clear that cannot happen.  Pt signed controlled substance agreement in office today and will get UDS as pt indicated she took hydrocodone last night.  Pt expressed understanding and is in agreement w/ plan.

## 2014-07-08 NOTE — Patient Instructions (Signed)
Follow up as needed Start the Cipro twice daily x7 days for suspected UTI (we'll notify you of the culture results Drink plenty of fluids Continue the pain meds as needed for pain If no improvement after a few days of antibiotics, please call or MyChart so we can send you to ortho Call with any questions or concerns Hang in there!!!

## 2014-07-08 NOTE — Progress Notes (Signed)
Pre visit review using our clinic review tool, if applicable. No additional management support is needed unless otherwise documented below in the visit note. 

## 2014-07-08 NOTE — Assessment & Plan Note (Signed)
New.  Suspect this is due to pt's abnormal UA and likely UTI.  Start Cipro.  Reviewed supportive care and red flags that should prompt return.  Pt expressed understanding and is in agreement w/ plan.

## 2014-07-09 ENCOUNTER — Encounter: Payer: Self-pay | Admitting: Family Medicine

## 2014-07-09 MED ORDER — BACLOFEN 10 MG PO TABS: ORAL_TABLET | ORAL | Status: DC

## 2014-07-09 NOTE — Telephone Encounter (Signed)
Please advise if ok for refill? Of baclofen

## 2014-07-09 NOTE — Telephone Encounter (Signed)
Med filled.  

## 2014-07-10 LAB — URINE CULTURE: Colony Count: 40000

## 2014-07-14 ENCOUNTER — Encounter: Payer: Self-pay | Admitting: Family Medicine

## 2014-07-17 ENCOUNTER — Ambulatory Visit (INDEPENDENT_AMBULATORY_CARE_PROVIDER_SITE_OTHER): Payer: Medicare Other | Admitting: Medical

## 2014-07-17 ENCOUNTER — Other Ambulatory Visit (HOSPITAL_COMMUNITY)
Admission: RE | Admit: 2014-07-17 | Discharge: 2014-07-17 | Disposition: A | Discharge status: Home / Self Care | Payer: Medicare Other | Source: Ambulatory Visit | Attending: Family Medicine | Admitting: Family Medicine

## 2014-07-17 ENCOUNTER — Ambulatory Visit (HOSPITAL_BASED_OUTPATIENT_CLINIC_OR_DEPARTMENT_OTHER)
Admission: RE | Admit: 2014-07-17 | Discharge: 2014-07-17 | Disposition: A | Discharge status: Home / Self Care | Payer: Medicare Other | Source: Ambulatory Visit | Attending: Medical | Admitting: Medical

## 2014-07-17 VITALS — BP 111/50 | HR 94 | Temp 98.2°F | Wt 172.0 lb

## 2014-07-17 DIAGNOSIS — M47892 Other spondylosis, cervical region: Secondary | ICD-10-CM | POA: Insufficient documentation

## 2014-07-17 DIAGNOSIS — M546 Pain in thoracic spine: Secondary | ICD-10-CM | POA: Diagnosis not present

## 2014-07-17 DIAGNOSIS — Z113 Encounter for screening for infections with a predominantly sexual mode of transmission: Secondary | ICD-10-CM | POA: Diagnosis not present

## 2014-07-17 DIAGNOSIS — R1030 Lower abdominal pain, unspecified: Secondary | ICD-10-CM | POA: Diagnosis not present

## 2014-07-17 DIAGNOSIS — N760 Acute vaginitis: Secondary | ICD-10-CM | POA: Insufficient documentation

## 2014-07-17 DIAGNOSIS — N390 Urinary tract infection, site not specified: Secondary | ICD-10-CM | POA: Diagnosis not present

## 2014-07-17 DIAGNOSIS — M5032 Other cervical disc degeneration, mid-cervical region: Secondary | ICD-10-CM | POA: Diagnosis not present

## 2014-07-17 LAB — POCT URINALYSIS DIPSTICK
Bilirubin, UA: NEGATIVE
Blood, UA: NEGATIVE
Glucose, UA: NEGATIVE
Ketones, UA: NEGATIVE
Leukocytes, UA: NEGATIVE
Nitrite, UA: NEGATIVE
Protein, UA: NEGATIVE
Spec Grav, UA: 1.01
Urobilinogen, UA: 0.2
pH, UA: 6

## 2014-07-17 LAB — CBC WITH DIFFERENTIAL/PLATELET
Basophils Absolute: 0 10*3/uL (ref 0.0–0.1)
Basophils Relative: 0.6 % (ref 0.0–3.0)
Eosinophils Absolute: 0.2 10*3/uL (ref 0.0–0.7)
Eosinophils Relative: 3.3 % (ref 0.0–5.0)
HCT: 40.7 % (ref 36.0–46.0)
Hemoglobin: 13.5 g/dL (ref 12.0–15.0)
Lymphocytes Relative: 33.5 % (ref 12.0–46.0)
Lymphs Abs: 2.1 10*3/uL (ref 0.7–4.0)
MCHC: 33.1 g/dL (ref 30.0–36.0)
MCV: 93.5 fl (ref 78.0–100.0)
Monocytes Absolute: 0.4 10*3/uL (ref 0.1–1.0)
Monocytes Relative: 7 % (ref 3.0–12.0)
Neutro Abs: 3.6 10*3/uL (ref 1.4–7.7)
Neutrophils Relative %: 55.6 % (ref 43.0–77.0)
Platelets: 311 10*3/uL (ref 150.0–400.0)
RBC: 4.36 Mil/uL (ref 3.87–5.11)
RDW: 13.8 % (ref 11.5–15.5)
WBC: 6.4 10*3/uL (ref 4.0–10.5)

## 2014-07-17 MED ORDER — NITROFURANTOIN MONOHYD MACRO 100 MG PO CAPS: 100.0000 mg | ORAL_CAPSULE | Freq: Two times a day (BID) | ORAL | Status: DC

## 2014-07-17 NOTE — Assessment & Plan Note (Addendum)
Urine clear but 40,000 count on last culture and just recent. Symptom she has is mild constant suprapubic pressure. Will get culture and urine ancillary studies. Rx macrobid pending the cultures. Note pt has some faint rt cva tenderness. No pain in rt lower quadrant, lft lower quadrant or upper quadrant.

## 2014-07-17 NOTE — Progress Notes (Signed)
Subjective:    Patient ID: Courtney Garza, female    DOB: 06/09/1970, 44 y.o.   MRN: [id number]  HPI  Pt in with some suprapubic region pain. No pain on urination. No odor to urine. No frequent urination. No fever, no chills or sweats. Then states going through early menopause per pt. Pt took 7 days or cipro. Her suprapubic area pressure still persists. She had 40,000 bacteria count on last urine culture. No vaginal discharge.  Pt given cipro on last visit helped for 2 days then pressure returned.  Pt has some rt upper back soreness. She points to area adjacent to t-spine on rt side.     Review of Systems  Constitutional: Negative for fever, chills and fatigue.  Respiratory: Negative for choking, chest tightness, shortness of breath and wheezing.   Cardiovascular: Negative for chest pain and palpitations.  Gastrointestinal: Negative for nausea, vomiting, abdominal pain, diarrhea, constipation, blood in stool, abdominal distention, anal bleeding and rectal pain.  Genitourinary: Negative for dysuria, urgency, frequency, hematuria, flank pain, decreased urine volume, vaginal bleeding, difficulty urinating, genital sores, vaginal pain, menstrual problem and pelvic pain.       On mid suprapubic tenderness.  Musculoskeletal: Positive for back pain.  Hematological: Negative for adenopathy. Does not bruise/bleed easily.  Psychiatric/Behavioral: Negative for behavioral problems and confusion.    Past Medical History  Diagnosis Date  . Migraine   . High cholesterol   . Depression   . Anxiety   . Dysarthria     Left arm  . History of stroke     History   Social History  . Marital Status: Married    Spouse Name: Tammy SoursGreg   . Number of Children: 3  . Years of Education: 10 th   Occupational History  .      Disabled   Social History Main Topics  . Smoking status: Former Smoker    Quit date: 02/22/2011  . Smokeless tobacco: Never Used     Comment: Quit 2013  . Alcohol Use: No    . Drug Use: Yes    Special: Marijuana  . Sexual Activity: Not on file   Other Topics Concern  . Not on file   Social History Narrative   Patient lives at home with her husband Earl LitesGregory.   Disabled.   Education 10 th   Right handed.   Caffeine six cup of coffee daily.      Past Surgical History  Procedure Laterality Date  . Vaginal hysterectomy    . Appendectomy    . Breast enhancement surgery    . Vena cava filter placement      and removal    Family History  Problem Relation Age of Onset  . Cancer Mother     Allergies  Allergen Reactions  . Acetaminophen Hives  . Fish Allergy     Hives and migraines  . Hydromorphone Other (See Comments)    Could not speak or keep her eyes open  . Migraine Formula  [Aspirin-Acetaminophen-Caffeine] Hives    pt is able to eat shellfish without a problem.   . Ondansetron Nausea And Vomiting  . Tylenol [Acetaminophen]   . Duloxetine Hcl Rash    hallucinations    Current Outpatient Prescriptions on File Prior to Visit  Medication Sig Dispense Refill  . albuterol (PROVENTIL HFA;VENTOLIN HFA) 108 (90 BASE) MCG/ACT inhaler Inhale 2 puffs into the lungs every 6 (six) hours as needed for wheezing or shortness of breath. 1 Inhaler 6  .  amitriptyline (ELAVIL) 25 MG tablet Take 1 tablet (25 mg total) by mouth at bedtime. 30 tablet 3  . Ascorbic Acid (VITAMIN C) 1000 MG tablet Take 1,000 mg by mouth daily.    Marland Kitchen aspirin 325 MG tablet Take 325 mg by mouth daily.    Marland Kitchen atorvastatin (LIPITOR) 20 MG tablet TAKE 1 TABLET BY MOUTH EVERY DAY 30 tablet 6  . baclofen (LIORESAL) 10 MG tablet Take 1 tablet in the am, 1 tablet in the afternoon, and 2 tablets in the pmTake 1 tablet in the am, 1 tablet in the afternoon, and 2 tablets in the pm 120 each 0  . cetirizine (ZYRTEC) 10 MG tablet Take 1 tablet (10 mg total) by mouth daily. 30 tablet 11  . Cholecalciferol (VITAMIN D3) 2000 UNITS TABS Take 2,000 Units by mouth.    . cyproheptadine (PERIACTIN) 4 MG  tablet Take 1 tablet (4 mg total) by mouth 3 (three) times daily as needed. 30 tablet 0  . Diclofenac Potassium (CAMBIA) 50 MG PACK Take 1package at earliest onset of headache x1. 1 each 2  . dicyclomine (BENTYL) 20 MG tablet TAKE 1 TABLET BY MOUTH EVERY 6 HOURS 60 tablet 3  . gabapentin (NEURONTIN) 300 MG capsule Take 1 capsule (300 mg total) by mouth 2 (two) times daily. Morning, afternoon 60 capsule 6  . gabapentin (NEURONTIN) 600 MG tablet Take 1 tablet (600 mg total) by mouth at bedtime. 30 tablet 3  . HYDROcodone-acetaminophen (NORCO) 5-325 MG per tablet Take 1 tablet by mouth every 6 (six) hours as needed for moderate pain. 30 tablet 0  . meloxicam (MOBIC) 15 MG tablet TAKE 1 TABLET(15 MG) BY MOUTH DAILY 30 tablet 1  . Multiple Vitamins-Minerals (MULTIVITAMIN & MINERAL PO) Take by mouth daily.    Marland Kitchen omeprazole (PRILOSEC) 20 MG capsule Take 1 capsule (20 mg total) by mouth daily. 30 capsule 6  . promethazine (PHENERGAN) 25 MG tablet Take 1 tablet (25 mg total) by mouth every 6 (six) hours as needed for nausea or vomiting. 30 tablet 3  . venlafaxine XR (EFFEXOR-XR) 150 MG 24 hr capsule TAKE 1 CAPSULE BY MOUTH EVERY DAY WITH BREAKFAST 30 capsule 3  . ciprofloxacin (CIPRO) 500 MG tablet Take 1 tablet (500 mg total) by mouth 2 (two) times daily. (Patient not taking: Reported on 07/17/2014) 14 tablet 0  . predniSONE (DELTASONE) 10 MG tablet 3 tabs x3 days and then 2 tabs x3 days and then 1 tab x3 days.  Take w/ food. (Patient not taking: Reported on 07/17/2014) 18 tablet 0  . promethazine-dextromethorphan (PROMETHAZINE-DM) 6.25-15 MG/5ML syrup Take 5 mLs by mouth 4 (four) times daily as needed. (Patient not taking: Reported on 07/17/2014) 240 mL 0   No current facility-administered medications on file prior to visit.    BP 111/50 mmHg  Pulse 94  Temp(Src) 98.2 F (36.8 C)  Wt 172 lb (78.019 kg)  SpO2 99%       Objective:   Physical Exam  General Appearance- Not in acute  distress.  HEENT Eyes- Scleraeral/Conjuntiva-bilat- Not Yellow. Mouth & Throat- Normal.  Chest and Lung Exam Auscultation: Breath sounds:-Normal. Adventitious sounds:- No Adventitious sounds. CTA  Cardiovascular Auscultation:Rythm - Regular. Heart Sounds -Normal heart sounds.  Abdomen Inspection:-Inspection Normal.  Palpation/Perucssion: Palpation and Percussion of the abdomen reveal-  Only suprapubic Tender, No Rebound tenderness, No rigidity(Guarding) and No Palpable abdominal masses. No heel jar pain. Liver:-Normal.  Spleen:- Normal.   Back- mid tspine mild tender rt side. But no mid  spinal pain. Rt cva- faint tender. Lt cva- nontender.        Assessment & Plan:
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
## 2014-07-17 NOTE — Progress Notes (Signed)
Pre visit review using our clinic review tool, if applicable. No additional management support is needed unless otherwise documented below in the visit note. 

## 2014-07-18 LAB — URINE CYTOLOGY ANCILLARY ONLY
Chlamydia: NEGATIVE
Neisseria Gonorrhea: NEGATIVE
Trichomonas: NEGATIVE

## 2014-07-19 LAB — URINE CULTURE: Colony Count: >=100000

## 2014-07-20 ENCOUNTER — Encounter: Payer: Self-pay | Admitting: Medical

## 2014-07-21 LAB — URINE CYTOLOGY ANCILLARY ONLY: Bacterial vaginitis: POSITIVE — AB

## 2014-07-22 MED ORDER — METRONIDAZOLE 500 MG PO TABS: 500.0000 mg | ORAL_TABLET | Freq: Three times a day (TID) | ORAL | Status: DC

## 2014-07-22 NOTE — Telephone Encounter (Signed)
Pt has bv. Rx of flagyl sent to her pharmacy.

## 2014-07-23 ENCOUNTER — Encounter: Payer: Self-pay | Admitting: Medical

## 2014-07-23 MED ORDER — FLUCONAZOLE 150 MG PO TABS: 150.0000 mg | ORAL_TABLET | Freq: Once | ORAL | Status: DC

## 2014-07-23 NOTE — Telephone Encounter (Signed)
Pt want diflucan called in after being on antibiotic.

## 2014-07-29 ENCOUNTER — Other Ambulatory Visit: Payer: Self-pay | Admitting: Physician Assistant

## 2014-07-29 NOTE — Telephone Encounter (Signed)
Rx request to pharmacy/SLS  

## 2014-08-02 ENCOUNTER — Encounter: Payer: Self-pay | Admitting: Medical

## 2014-08-09 ENCOUNTER — Encounter: Payer: Self-pay | Admitting: Neurology

## 2014-08-11 ENCOUNTER — Other Ambulatory Visit: Payer: Self-pay | Admitting: Neurology

## 2014-08-11 MED ORDER — BACLOFEN 10 MG PO TABS: ORAL_TABLET | ORAL | Status: DC

## 2014-08-12 ENCOUNTER — Other Ambulatory Visit: Payer: Self-pay | Admitting: *Deleted

## 2014-08-12 ENCOUNTER — Telehealth: Payer: Self-pay | Admitting: Neurology

## 2014-08-12 MED ORDER — BACLOFEN 10 MG PO TABS: ORAL_TABLET | ORAL | Status: DC

## 2014-08-12 NOTE — Telephone Encounter (Signed)
Pt called and said her prescription has not been called in, Baciofen/Dawn CB# [phone]

## 2014-08-12 NOTE — Telephone Encounter (Signed)
Baclofen 10 mg # 120 was called to pharmacy

## 2014-08-16 ENCOUNTER — Other Ambulatory Visit: Payer: Self-pay | Admitting: Family Medicine

## 2014-08-18 ENCOUNTER — Encounter: Payer: Self-pay | Admitting: Medical

## 2014-08-18 ENCOUNTER — Ambulatory Visit: Payer: Medicare Other | Admitting: Neurology

## 2014-08-18 NOTE — Telephone Encounter (Signed)
Med filled.  

## 2014-08-19 ENCOUNTER — Ambulatory Visit (INDEPENDENT_AMBULATORY_CARE_PROVIDER_SITE_OTHER): Payer: Medicare Other | Admitting: Medical

## 2014-08-19 ENCOUNTER — Encounter: Payer: Self-pay | Admitting: Medical

## 2014-08-19 VITALS — BP 115/83 | HR 118 | Temp 98.2°F | Ht 63.0 in | Wt 172.8 lb

## 2014-08-19 DIAGNOSIS — R1031 Right lower quadrant pain: Secondary | ICD-10-CM

## 2014-08-19 DIAGNOSIS — R109 Unspecified abdominal pain: Secondary | ICD-10-CM | POA: Insufficient documentation

## 2014-08-19 DIAGNOSIS — R102 Pelvic and perineal pain: Secondary | ICD-10-CM

## 2014-08-19 DIAGNOSIS — M5489 Other dorsalgia: Secondary | ICD-10-CM

## 2014-08-19 LAB — POCT URINALYSIS DIPSTICK
Bilirubin, UA: NEGATIVE
Blood, UA: NEGATIVE
Glucose, UA: NEGATIVE
Ketones, UA: NEGATIVE
Leukocytes, UA: NEGATIVE
Nitrite, UA: NEGATIVE
Protein, UA: NEGATIVE
Spec Grav, UA: <=1.005
Urobilinogen, UA: 0.2
pH, UA: 6

## 2014-08-19 MED ORDER — HYDROCODONE-ACETAMINOPHEN 5-325 MG PO TABS: 1.0000 | ORAL_TABLET | Freq: Four times a day (QID) | ORAL | Status: DC | PRN

## 2014-08-19 MED ORDER — NITROFURANTOIN MONOHYD MACRO 100 MG PO CAPS: 100.0000 mg | ORAL_CAPSULE | Freq: Two times a day (BID) | ORAL | Status: DC

## 2014-08-19 NOTE — Progress Notes (Signed)
Subjective:    Patient ID: Courtney Garza, female    DOB: 09/11/1970, 44 y.o.   MRN: [id number]  HPI   Pt in with some pain pain in both lower quadrants. Some back pain as well. Lower cva areas. Also some mid suprapubic pressure that radiates to both lower quadrants. Pain for 5 days.   No dysuria. No frequent urination.  Nausea, but no vomiting. No chills, no fevers or sweats.   End of may she had mixed bacteria uti by culture and bv. Pt was on macrobid and the flagyl. She felt good after taking meds but then 5 days ago got above symptoms.  Pt still had ovaries but uterus removed.  Hx of ovarian cyst in the past. Will come and go.  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   Review of Systems  Constitutional: Negative for fever, appetite change and fatigue.  Respiratory: Negative for cough, chest tightness, shortness of breath and wheezing.   Cardiovascular: Negative for chest pain and palpitations.  Gastrointestinal: Positive for nausea. Negative for vomiting, diarrhea, constipation, blood in stool and abdominal distention.       See hpi.  Genitourinary: Negative for dysuria, urgency, frequency, hematuria, vaginal discharge, enuresis, vaginal pain and dyspareunia.       See hpi. Adnexal pain.  Musculoskeletal: Positive for back pain.       See hpi  Neurological: Negative for headaches.    Past Medical History  Diagnosis Date  . Migraine   . High cholesterol   . Depression   . Anxiety   . Dysarthria     Left arm  . History of stroke     History   Social History  . Marital Status: Married    Spouse Name: Tammy SoursGreg   . Number of Children: 3  . Years of Education: 10 th   Occupational History  .      Disabled   Social History Main Topics  . Smoking status: Former Smoker    Quit date: 02/22/2011  . Smokeless tobacco: Never Used     Comment: Quit 2013  . Alcohol Use: No  . Drug Use: Yes    Special: Marijuana  . Sexual Activity: Not on file   Other  Topics Concern  . Not on file   Social History Narrative   Patient lives at home with her husband Earl LitesGregory.   Disabled.   Education 10 th   Right handed.   Caffeine six cup of coffee daily.      Past Surgical History  Procedure Laterality Date  . Vaginal hysterectomy    . Appendectomy    . Breast enhancement surgery    . Vena cava filter placement      and removal    Family History  Problem Relation Age of Onset  . Cancer Mother     Allergies  Allergen Reactions  . Acetaminophen Hives  . Fish Allergy     Hives and migraines  . Hydromorphone Other (See Comments)    Could not speak or keep her eyes open  . Migraine Formula  [Aspirin-Acetaminophen-Caffeine] Hives    pt is able to eat shellfish without a problem.   . Ondansetron Nausea And Vomiting  . Tylenol [Acetaminophen]   . Duloxetine Hcl Rash    hallucinations    Current Outpatient Prescriptions on File Prior to Visit  Medication Sig Dispense Refill  . albuterol (PROVENTIL HFA;VENTOLIN HFA) 108 (90 BASE) MCG/ACT inhaler Inhale 2 puffs into  the lungs every 6 (six) hours as needed for wheezing or shortness of breath. 1 Inhaler 6  . amitriptyline (ELAVIL) 25 MG tablet Take 1 tablet (25 mg total) by mouth at bedtime. 30 tablet 3  . Ascorbic Acid (VITAMIN C) 1000 MG tablet Take 1,000 mg by mouth daily.    Marland Kitchen aspirin 325 MG tablet Take 325 mg by mouth daily.    Marland Kitchen atorvastatin (LIPITOR) 20 MG tablet TAKE 1 TABLET BY MOUTH EVERY DAY 30 tablet 6  . baclofen (LIORESAL) 10 MG tablet Take 1 tablet in the am, 1 tablet in the afternoon, and 2 tablets in the pm. 120 each 3  . cetirizine (ZYRTEC) 10 MG tablet Take 1 tablet (10 mg total) by mouth daily. 30 tablet 11  . Cholecalciferol (VITAMIN D3) 2000 UNITS TABS Take 2,000 Units by mouth.    . cyproheptadine (PERIACTIN) 4 MG tablet Take 1 tablet (4 mg total) by mouth 3 (three) times daily as needed. 30 tablet 0  . Diclofenac Potassium (CAMBIA) 50 MG PACK Take 1package at earliest  onset of headache x1. 1 each 2  . dicyclomine (BENTYL) 20 MG tablet TAKE 1 TABLET BY MOUTH EVERY 6 HOURS 60 tablet 3  . gabapentin (NEURONTIN) 600 MG tablet TAKE 1 TABLET BY MOUTH EVERY NIGHT AT BEDTIME 30 tablet 3  . meloxicam (MOBIC) 15 MG tablet TAKE 1 TABLET(15 MG) BY MOUTH DAILY 30 tablet 1  . Multiple Vitamins-Minerals (MULTIVITAMIN & MINERAL PO) Take by mouth daily.    Marland Kitchen omeprazole (PRILOSEC) 20 MG capsule Take 1 capsule (20 mg total) by mouth daily. 30 capsule 6  . promethazine (PHENERGAN) 25 MG tablet Take 1 tablet (25 mg total) by mouth every 6 (six) hours as needed for nausea or vomiting. 30 tablet 3  . promethazine-dextromethorphan (PROMETHAZINE-DM) 6.25-15 MG/5ML syrup Take 5 mLs by mouth 4 (four) times daily as needed. (Patient not taking: Reported on 07/17/2014) 240 mL 0  . venlafaxine XR (EFFEXOR-XR) 150 MG 24 hr capsule TAKE 1 CAPSULE BY MOUTH EVERY DAY WITH BREAKFAST 30 capsule 3   No current facility-administered medications on file prior to visit.    BP 115/83 mmHg  Pulse 118  Temp(Src) 98.2 F (36.8 C) (Oral)  Ht  (1.6 m)  Wt 172 lb 12.8 oz (78.382 kg)  BMI 30.62 kg/m2  SpO2 97%       Objective:   Physical Exam  General Appearance- Not in acute distress.  HEENT Eyes- Scleraeral/Conjuntiva-bilat- Not Yellow. Mouth & Throat- Normal.  Chest and Lung Exam Auscultation: Breath sounds:-Normal. Adventitious sounds:- No Adventitious sounds.  Cardiovascular Auscultation:Rythm - Regular. Heart Sounds -Normal heart sounds.  Abdomen Inspection:-Inspection Normal.  Palpation/Perucssion: Palpation and Percussion of the abdomen reveal- moderate rt adnexal Tender and faint rt lower quadrant tenderness, No Rebound tenderness, No rigidity(Guarding) and No Palpable abdominal masses.  Liver:-Normal.  Spleen:- Normal.   Back- faint lower back pain si area /lower cva areas.        Assessment & Plan:

## 2014-08-19 NOTE — Assessment & Plan Note (Signed)
Bilateral lower cva area pain. Worse on rt side. Will get culture of urine. Make macrobid available. If symptoms change or worsen indicating infection.

## 2014-08-19 NOTE — Assessment & Plan Note (Addendum)
Will go ahead and get cbc and cmp today. Pt has some nausea. She will use phenergan she has at home. If abd pain worsen or changes notify us.

## 2014-08-19 NOTE — Progress Notes (Signed)
Pre visit review using our clinic review tool, if applicable. No additional management support is needed unless otherwise documented below in the visit note. 

## 2014-08-19 NOTE — Patient Instructions (Addendum)
Adnexal pain Pain rt adenxa. Hx of ovarian cyst. Will get pelvic us. Rx of hydrocodone limited number. To use 1 tab q 6 hours prn pain pending study results.  Back pain Bilateral lower cva area pain. Worse on rt side. Will get culture of urine. Make macrobid available. If symptoms change or worsen indicating infection.  Pain in the abdomen Will go ahead and get cbc and cmp today. Pt has some nausea. She will use phenergan she has at home. If abd pain worsen or changes notify us.    Follow up 7 days or as needed

## 2014-08-19 NOTE — Assessment & Plan Note (Addendum)
Pain rt adenxa. Hx of ovarian cyst. Will get pelvic us. Rx of hydrocodone limited number. To use 1 tab q 6 hours prn pain pending study results.

## 2014-08-20 ENCOUNTER — Ambulatory Visit (HOSPITAL_BASED_OUTPATIENT_CLINIC_OR_DEPARTMENT_OTHER): Payer: Medicare Other

## 2014-08-20 LAB — COMPREHENSIVE METABOLIC PANEL
ALT: 17 U/L (ref 0–35)
AST: 20 U/L (ref 0–37)
Albumin: 4.2 g/dL (ref 3.5–5.2)
Alkaline Phosphatase: 98 U/L (ref 39–117)
BUN: 8 mg/dL (ref 6–23)
CO2: 28 mEq/L (ref 19–32)
Calcium: 9.8 mg/dL (ref 8.4–10.5)
Chloride: 103 mEq/L (ref 96–112)
Creatinine, Ser: 0.64 mg/dL (ref 0.40–1.20)
GFR: 106.82 mL/min (ref >60.00)
Glucose, Bld: 77 mg/dL (ref 70–99)
Potassium: 3.9 mEq/L (ref 3.5–5.1)
Sodium: 141 mEq/L (ref 135–145)
Total Bilirubin: 0.2 mg/dL (ref 0.2–1.2)
Total Protein: 7.6 g/dL (ref 6.0–8.3)

## 2014-08-20 LAB — CBC WITH DIFFERENTIAL/PLATELET
Basophils Absolute: 0 10*3/uL (ref 0.0–0.1)
Basophils Relative: 0.7 % (ref 0.0–3.0)
Eosinophils Absolute: 0.1 10*3/uL (ref 0.0–0.7)
Eosinophils Relative: 2 % (ref 0.0–5.0)
HCT: 40.8 % (ref 36.0–46.0)
Hemoglobin: 13.8 g/dL (ref 12.0–15.0)
Lymphocytes Relative: 40.1 % (ref 12.0–46.0)
Lymphs Abs: 2.8 10*3/uL (ref 0.7–4.0)
MCHC: 33.7 g/dL (ref 30.0–36.0)
MCV: 92.2 fl (ref 78.0–100.0)
Monocytes Absolute: 0.4 10*3/uL (ref 0.1–1.0)
Monocytes Relative: 5.7 % (ref 3.0–12.0)
Neutro Abs: 3.6 10*3/uL (ref 1.4–7.7)
Neutrophils Relative %: 51.5 % (ref 43.0–77.0)
Platelets: 234 10*3/uL (ref 150.0–400.0)
RBC: 4.43 Mil/uL (ref 3.87–5.11)
RDW: 13.5 % (ref 11.5–15.5)
WBC: 7 10*3/uL (ref 4.0–10.5)

## 2014-08-21 ENCOUNTER — Ambulatory Visit (HOSPITAL_BASED_OUTPATIENT_CLINIC_OR_DEPARTMENT_OTHER)
Admission: RE | Admit: 2014-08-21 | Discharge: 2014-08-21 | Disposition: A | Discharge status: Home / Self Care | Payer: Medicare Other | Source: Ambulatory Visit | Attending: Medical | Admitting: Medical

## 2014-08-21 DIAGNOSIS — Z90710 Acquired absence of both cervix and uterus: Secondary | ICD-10-CM | POA: Diagnosis not present

## 2014-08-21 DIAGNOSIS — R102 Pelvic and perineal pain: Secondary | ICD-10-CM | POA: Diagnosis not present

## 2014-09-11 ENCOUNTER — Ambulatory Visit (INDEPENDENT_AMBULATORY_CARE_PROVIDER_SITE_OTHER): Payer: Medicare Other | Admitting: Neurology

## 2014-09-11 DIAGNOSIS — G43709 Chronic migraine without aura, not intractable, without status migrainosus: Secondary | ICD-10-CM | POA: Diagnosis not present

## 2014-09-11 DIAGNOSIS — G8110 Spastic hemiplegia affecting unspecified side: Secondary | ICD-10-CM | POA: Diagnosis not present

## 2014-09-11 DIAGNOSIS — G8114 Spastic hemiplegia affecting left nondominant side: Secondary | ICD-10-CM

## 2014-09-11 MED ORDER — ONABOTULINUMTOXINA 100 UNITS IJ SOLR
150.0000 [IU] | Freq: Once | INTRAMUSCULAR | Status: AC
  Administered 2014-09-11: 150 [IU] via INTRAMUSCULAR

## 2014-09-11 MED ORDER — ONABOTULINUMTOXINA 100 UNITS IJ SOLR
200.0000 [IU] | Freq: Once | INTRAMUSCULAR | Status: AC
  Administered 2014-09-11: 200 [IU] via INTRAMUSCULAR

## 2014-09-11 NOTE — Progress Notes (Signed)
See Botox procedure note 

## 2014-09-11 NOTE — Procedures (Signed)
Procedure Note Botox  Attending: Dr. Keishawn Darsey  I. 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 Risk discussed included, but were not limited pain and discomfort, bleeding, bruising, excessive weakness, venous thrombosis, muscle atrophy and dysphagia. A copy of the patient medication guide was given to the patient which explains the blackbox warning.  Patients identity and treatment sites confirmed Yes. .  Details of Procedure: Skin was cleaned with alcohol. A hollow monopolor needle was introduced to the target muscle. Prior to injection, the needle plunger was aspirated to make sure the needle was not within a blood vessel. There was no blood retrieved on aspiration.   Following is a summary of the muscles injected And the amount of Botulinum toxin used:   Dilution 0.9% preservative free saline mixed with 100 u Botox type A to make 10 U per 0.1cc  Injections  Left flexor digitorum profundus 50 units (total of 3 sites) Left flexor digitorum superficialis 50 units (total of 3 sites) Left flexor carpi ulnaris 25 units (total of 1 site) Left flexor carpi radialis 25 units (total of 1 site) TOTAL UNITS: 150 units  Agent: Botulinum Type A ( Onobotulinum Toxin type A ). 2 vials of Botox were used, each containing 100 units and freshly diluted with 1 mL of sterile, non-preserved saline  Total injected (Units): 150 units Total wasted (Units): 50 units   Pt tolerated procedure well without complications.  Reinjection is anticipated in 3 months.   II. Preoperative Diagnosis(es): Chronic migraine  Consent obtained from: The patient Benefits discussed included, but were not limited to decreased muscle tightness, increased joint range of motion, and  decreased pain. Risk discussed included, but were not limited pain and discomfort, bleeding, bruising, excessive weakness, venous thrombosis, muscle atrophy and dysphagia. A copy of the patient medication guide was given to the patient which explains the blackbox warning.  Patients identity and treatment sites confirmed Yes. .  Details of Procedure: Skin was cleaned with alcohol. Prior to injection, the needle plunger was aspirated to make sure the needle was not within a blood vessel. There was no blood retrieved on aspiration.   Following is a summary of the muscles injected And the amount of Botulinum toxin used:  Dilution 200 units were reconstituted with 4 mL 0.9% sodium chloride. Skin was cleaned with alcohol prior to injection. A 30-gauge, 0.5-inch needle was used.  Total: 4 mL (200 units)  # of injection sitesMuscleDose Total 1  Corrugator 0.1 mL (5 units) 1 Procerus 0.2 mL (10 units) 2 each sideFrontalis0.4 mL (20 units) 4 each sideTemporalis0.8 mL (40 units) 3 each sideOccipitalis0.6 mL (30 units) 2 each sideCervical paraspinals0.4 mL (20 units) 3 each sideTrapezius0.6 mL (30 units)  Total 31Totalused155 units Total wasted45 units   Pt tolerated procedure well without complications.  Reinjection is anticipated in 3 months. 

## 2014-09-16 ENCOUNTER — Other Ambulatory Visit: Payer: Self-pay | Admitting: Family Medicine

## 2014-09-20 ENCOUNTER — Other Ambulatory Visit: Payer: Self-pay | Admitting: Neurology

## 2014-09-25 ENCOUNTER — Encounter: Payer: Self-pay | Admitting: Family Medicine

## 2014-09-25 ENCOUNTER — Ambulatory Visit (INDEPENDENT_AMBULATORY_CARE_PROVIDER_SITE_OTHER): Payer: Medicare Other | Admitting: Family Medicine

## 2014-09-25 VITALS — BP 112/80 | HR 98 | Temp 98.0°F | Resp 16 | Ht 63.0 in | Wt 173.0 lb

## 2014-09-25 DIAGNOSIS — E7800 Pure hypercholesterolemia, unspecified: Secondary | ICD-10-CM

## 2014-09-25 LAB — HEPATIC FUNCTION PANEL
ALT: 18 U/L (ref 0–35)
AST: 19 U/L (ref 0–37)
Albumin: 4 g/dL (ref 3.5–5.2)
Alkaline Phosphatase: 101 U/L (ref 39–117)
Bilirubin, Direct: 0 mg/dL (ref 0.0–0.3)
Total Bilirubin: 0.3 mg/dL (ref 0.2–1.2)
Total Protein: 7.2 g/dL (ref 6.0–8.3)

## 2014-09-25 LAB — LIPID PANEL
Cholesterol: 154 mg/dL (ref 0–200)
HDL: 58.5 mg/dL (ref >39.00)
LDL Cholesterol: 74 mg/dL (ref 0–99)
NonHDL: 95.22
Total CHOL/HDL Ratio: 3
Triglycerides: 106 mg/dL (ref 0.0–149.0)
VLDL: 21.2 mg/dL (ref 0.0–40.0)

## 2014-09-25 LAB — BASIC METABOLIC PANEL
BUN: 7 mg/dL (ref 6–23)
CO2: 28 mEq/L (ref 19–32)
Calcium: 9.4 mg/dL (ref 8.4–10.5)
Chloride: 103 mEq/L (ref 96–112)
Creatinine, Ser: 0.65 mg/dL (ref 0.40–1.20)
GFR: 104.88 mL/min (ref >60.00)
Glucose, Bld: 85 mg/dL (ref 70–99)
Potassium: 3.8 mEq/L (ref 3.5–5.1)
Sodium: 139 mEq/L (ref 135–145)

## 2014-09-25 NOTE — Assessment & Plan Note (Signed)
Chronic problem.  Tolerating statin w/o difficulty.  Check labs.  Adjust meds prn  

## 2014-09-25 NOTE — Progress Notes (Signed)
Pre visit review using our clinic review tool, if applicable. No additional management support is needed unless otherwise documented below in the visit note. 

## 2014-09-25 NOTE — Patient Instructions (Signed)
Schedule your complete physical in 6 months We'll notify you of your lab results and make any changes if needed Keep up the good work!  You look great! Call with any questions or concerns Enjoy the rest of your summer!!! 

## 2014-09-25 NOTE — Progress Notes (Signed)
   Subjective:    Patient ID: MEYLI BOICE, female    DOB: October 25, 1970, 44 y.o.   MRN: [id number]  HPI Hyperlipidemia- chronic problem, pt is on Lipitor daily.  No abd pain, N/V, myalgias.  No CP, SOB, visual changes, edema.   Review of Systems For ROS see HPI     Objective:   Physical Exam  Constitutional: She is oriented to person, place, and time. She appears well-developed and well-nourished. No distress.  HENT:  Head: Normocephalic and atraumatic.  Eyes: Conjunctivae and EOM are normal. Pupils are equal, round, and reactive to light.  Neck: Normal range of motion. Neck supple. No thyromegaly present.  Cardiovascular: Normal rate, regular rhythm, normal heart sounds and intact distal pulses.   No murmur heard. Pulmonary/Chest: Effort normal and breath sounds normal. No respiratory distress.  Abdominal: Soft. She exhibits no distension. There is no tenderness.  Musculoskeletal: She exhibits no edema.  Lymphadenopathy:    She has no cervical adenopathy.  Neurological: She is alert and oriented to person, place, and time.  Skin: Skin is warm and dry.  Psychiatric: She has a normal mood and affect. Her behavior is normal.  Vitals reviewed.         Assessment & Plan:

## 2014-09-30 ENCOUNTER — Ambulatory Visit (INDEPENDENT_AMBULATORY_CARE_PROVIDER_SITE_OTHER): Payer: Medicare Other | Admitting: Physician Assistant

## 2014-09-30 ENCOUNTER — Ambulatory Visit (HOSPITAL_BASED_OUTPATIENT_CLINIC_OR_DEPARTMENT_OTHER)
Admission: RE | Admit: 2014-09-30 | Discharge: 2014-09-30 | Disposition: A | Discharge status: Home / Self Care | Payer: Medicare Other | Source: Ambulatory Visit | Attending: Physician Assistant | Admitting: Physician Assistant

## 2014-09-30 ENCOUNTER — Encounter: Payer: Self-pay | Admitting: Physician Assistant

## 2014-09-30 VITALS — BP 96/78 | HR 127 | Temp 98.1°F | Ht 63.0 in | Wt 172.8 lb

## 2014-09-30 DIAGNOSIS — R197 Diarrhea, unspecified: Secondary | ICD-10-CM | POA: Diagnosis not present

## 2014-09-30 DIAGNOSIS — R918 Other nonspecific abnormal finding of lung field: Secondary | ICD-10-CM | POA: Insufficient documentation

## 2014-09-30 DIAGNOSIS — R1013 Epigastric pain: Secondary | ICD-10-CM

## 2014-09-30 DIAGNOSIS — R112 Nausea with vomiting, unspecified: Secondary | ICD-10-CM | POA: Insufficient documentation

## 2014-09-30 DIAGNOSIS — M545 Low back pain: Secondary | ICD-10-CM

## 2014-09-30 DIAGNOSIS — R0789 Other chest pain: Secondary | ICD-10-CM | POA: Diagnosis not present

## 2014-09-30 DIAGNOSIS — J45909 Unspecified asthma, uncomplicated: Secondary | ICD-10-CM | POA: Diagnosis not present

## 2014-09-30 DIAGNOSIS — R079 Chest pain, unspecified: Secondary | ICD-10-CM | POA: Diagnosis not present

## 2014-09-30 LAB — POCT URINALYSIS DIPSTICK
Bilirubin, UA: NEGATIVE
Blood, UA: NEGATIVE
Glucose, UA: NEGATIVE
Ketones, UA: NEGATIVE
Leukocytes, UA: NEGATIVE
Nitrite, UA: NEGATIVE
Protein, UA: NEGATIVE
Spec Grav, UA: 1.015
Urobilinogen, UA: 0.2
pH, UA: 5.5

## 2014-09-30 MED ORDER — HYDROCODONE-ACETAMINOPHEN 5-325 MG PO TABS: 1.0000 | ORAL_TABLET | Freq: Four times a day (QID) | ORAL | Status: DC | PRN

## 2014-09-30 NOTE — Progress Notes (Signed)
Pre visit review using our clinic review tool, if applicable. No additional management support is needed unless otherwise documented below in the visit note. 

## 2014-09-30 NOTE — Patient Instructions (Signed)
Please go to the lab. Then go downstairs for x-ray. I will call you with your results. Please continue medications as directed. Start Norco as directed if needed for significant pain. Start a liquid diet.  We will treat based on findings. If anything acutely worsens overnight, please call 911 or go to the ER.

## 2014-10-01 ENCOUNTER — Telehealth: Payer: Self-pay | Admitting: Physician Assistant

## 2014-10-01 DIAGNOSIS — A048 Other specified bacterial intestinal infections: Secondary | ICD-10-CM

## 2014-10-01 LAB — COMPREHENSIVE METABOLIC PANEL
ALT: 14 U/L (ref 0–35)
AST: 19 U/L (ref 0–37)
Albumin: 4.3 g/dL (ref 3.5–5.2)
Alkaline Phosphatase: 108 U/L (ref 39–117)
BUN: 6 mg/dL (ref 6–23)
CO2: 26 mEq/L (ref 19–32)
Calcium: 9.6 mg/dL (ref 8.4–10.5)
Chloride: 103 mEq/L (ref 96–112)
Creatinine, Ser: 0.71 mg/dL (ref 0.40–1.20)
GFR: 94.71 mL/min (ref >60.00)
Glucose, Bld: 96 mg/dL (ref 70–99)
Potassium: 3.6 mEq/L (ref 3.5–5.1)
Sodium: 140 mEq/L (ref 135–145)
Total Bilirubin: 0.3 mg/dL (ref 0.2–1.2)
Total Protein: 7.9 g/dL (ref 6.0–8.3)

## 2014-10-01 LAB — CBC WITH DIFFERENTIAL/PLATELET
Basophils Absolute: 0.1 10*3/uL (ref 0.0–0.1)
Basophils Relative: 0.8 % (ref 0.0–3.0)
Eosinophils Absolute: 0.2 10*3/uL (ref 0.0–0.7)
Eosinophils Relative: 1.7 % (ref 0.0–5.0)
HCT: 41.8 % (ref 36.0–46.0)
Hemoglobin: 14.1 g/dL (ref 12.0–15.0)
Lymphocytes Relative: 43.2 % (ref 12.0–46.0)
Lymphs Abs: 3.7 10*3/uL (ref 0.7–4.0)
MCHC: 33.7 g/dL (ref 30.0–36.0)
MCV: 92.3 fl (ref 78.0–100.0)
Monocytes Absolute: 0.4 10*3/uL (ref 0.1–1.0)
Monocytes Relative: 4.8 % (ref 3.0–12.0)
Neutro Abs: 4.3 10*3/uL (ref 1.4–7.7)
Neutrophils Relative %: 49.5 % (ref 43.0–77.0)
Platelets: 272 10*3/uL (ref 150.0–400.0)
RBC: 4.53 Mil/uL (ref 3.87–5.11)
RDW: 13.5 % (ref 11.5–15.5)
WBC: 8.7 10*3/uL (ref 4.0–10.5)

## 2014-10-01 LAB — H. PYLORI ANTIBODY, IGG: H Pylori IgG: POSITIVE — AB

## 2014-10-01 LAB — URINALYSIS, MICROSCOPIC ONLY
RBC / HPF: NONE SEEN (ref 0–?)
WBC, UA: NONE SEEN (ref 0–?)

## 2014-10-01 LAB — LIPASE: Lipase: 45 U/L (ref 11.0–59.0)

## 2014-10-01 MED ORDER — CLARITHROMYCIN 500 MG PO TABS: 500.0000 mg | ORAL_TABLET | Freq: Two times a day (BID) | ORAL | Status: DC

## 2014-10-01 MED ORDER — OMEPRAZOLE 20 MG PO CPDR: 20.0000 mg | DELAYED_RELEASE_CAPSULE | Freq: Two times a day (BID) | ORAL | Status: DC

## 2014-10-01 MED ORDER — METRONIDAZOLE 500 MG PO TABS: 500.0000 mg | ORAL_TABLET | Freq: Two times a day (BID) | ORAL | Status: DC

## 2014-10-01 NOTE — Telephone Encounter (Signed)
H pylori +. Other labs and imaging unremarkable. Discussed with patient. Will begin Triple Therapy. Rx sent in. Hold lipitor until completing regimen. Follow-up 2 weeks.

## 2014-10-02 ENCOUNTER — Encounter: Payer: Self-pay | Admitting: Physician Assistant

## 2014-10-02 NOTE — Progress Notes (Signed)
Patient presents to clinic today complaining of epigastric and LUQ pain, worse with eating, that has been present for the past week. Endorses nausea but denies vomiting. Denies change to urinary habits or bowel habits. Denies melena, hematochezia or tenesmus. Denies fever, chills. Denies change in diet. Endorses history of GERD previously. Denies alcohol consumption or NSAID use. Denies trauma or injury.  Past Medical History  Diagnosis Date  . Migraine   . High cholesterol   . Depression   . Anxiety   . Dysarthria     Left arm  . History of stroke     Current Outpatient Prescriptions on File Prior to Visit  Medication Sig Dispense Refill  . albuterol (PROVENTIL HFA;VENTOLIN HFA) 108 (90 BASE) MCG/ACT inhaler Inhale 2 puffs into the lungs every 6 (six) hours as needed for wheezing or shortness of breath. 1 Inhaler 6  . amitriptyline (ELAVIL) 25 MG tablet TAKE 1 TABLET BY MOUTH EVERY NIGHT AT BEDTIME 30 tablet 0  . Ascorbic Acid (VITAMIN C) 1000 MG tablet Take 1,000 mg by mouth daily.    Marland Kitchen aspirin 325 MG tablet Take 325 mg by mouth daily.    Marland Kitchen atorvastatin (LIPITOR) 20 MG tablet TAKE 1 TABLET BY MOUTH EVERY DAY 30 tablet 6  . baclofen (LIORESAL) 10 MG tablet Take 1 tablet in the am, 1 tablet in the afternoon, and 2 tablets in the pm. 120 each 3  . cetirizine (ZYRTEC) 10 MG tablet Take 1 tablet (10 mg total) by mouth daily. 30 tablet 11  . cyproheptadine (PERIACTIN) 4 MG tablet Take 1 tablet (4 mg total) by mouth 3 (three) times daily as needed. 30 tablet 0  . Diclofenac Potassium (CAMBIA) 50 MG PACK Take 1package at earliest onset of headache x1. 1 each 2  . dicyclomine (BENTYL) 20 MG tablet TAKE 1 TABLET BY MOUTH EVERY 6 HOURS 60 tablet 3  . gabapentin (NEURONTIN) 300 MG capsule Take 300 mg by mouth 2 (two) times daily.     Marland Kitchen gabapentin (NEURONTIN) 600 MG tablet TAKE 1 TABLET BY MOUTH EVERY NIGHT AT BEDTIME 30 tablet 3  . Multiple Vitamins-Minerals (MULTIVITAMIN & MINERAL PO) Take by  mouth daily.    . promethazine (PHENERGAN) 25 MG tablet Take 1 tablet (25 mg total) by mouth every 6 (six) hours as needed for nausea or vomiting. 30 tablet 3  . venlafaxine XR (EFFEXOR-XR) 150 MG 24 hr capsule Take 1 capsule (150 mg total) by mouth daily with breakfast. 30 capsule 1   No current facility-administered medications on file prior to visit.    Allergies  Allergen Reactions  . Acetaminophen Hives  . Fish Allergy     Hives and migraines  . Hydromorphone Other (See Comments)    Could not speak or keep her eyes open  . Migraine Formula  [Aspirin-Acetaminophen-Caffeine] Hives    pt is able to eat shellfish without a problem.   . Ondansetron Nausea And Vomiting  . Tylenol [Acetaminophen]   . Duloxetine Hcl Rash    hallucinations    Family History  Problem Relation Age of Onset  . Cancer Mother     Social History   Social History  . Marital Status: Married    Spouse Name: Marya Amsler   . Number of Children: 3  . Years of Education: 10 th   Occupational History  .      Disabled   Social History Main Topics  . Smoking status: Former Smoker    Quit date: 02/22/2011  .  Smokeless tobacco: Never Used     Comment: Quit 2013  . Alcohol Use: No  . Drug Use: Yes    Special: Marijuana  . Sexual Activity: Not Asked   Other Topics Concern  . None   Social History Narrative   Patient lives at home with her husband Belenda Cruise.   Disabled.   Education 10 th   Right handed.   Caffeine six cup of coffee daily.      Review of Systems - See HPI.  All other ROS are negative.  BP 96/78 mmHg  Pulse 127  Temp(Src) 98.1 F (36.7 C) (Oral)  Ht 5' 3"  (1.6 m)  Wt 172 lb 12.8 oz (78.382 kg)  BMI 30.62 kg/m2  SpO2 97%  Physical Exam  Constitutional: She is oriented to person, place, and time and well-developed, well-nourished, and in no distress.  HENT:  Head: Normocephalic and atraumatic.  Eyes: Conjunctivae are normal.  Neck: Neck supple.  Cardiovascular: Normal rate,  regular rhythm, normal heart sounds and intact distal pulses.   Abdominal: Soft. Normal appearance and bowel sounds are normal. There is no hepatosplenomegaly. There is tenderness in the epigastric area and left upper quadrant. There is no CVA tenderness, no tenderness at McBurney's point and negative Murphy's sign.  Musculoskeletal: Normal range of motion. She exhibits no edema or tenderness.  Neurological: She is alert and oriented to person, place, and time.  Skin: Skin is warm and dry. No rash noted.  Psychiatric: Affect normal.  Vitals reviewed.   Recent Results (from the past 2160 hour(s))  POCT urinalysis dipstick     Status: Abnormal   Collection Time: 07/08/14 11:52 AM  Result Value Ref Range   Color, UA yellow    Clarity, UA clear    Glucose, UA negative    Bilirubin, UA negative    Ketones, UA negative    Spec Grav, UA 1.015    Blood, UA negative    pH, UA 6.0    Protein, UA negative    Urobilinogen, UA negative    Nitrite, UA negative    Leukocytes, UA Trace   Urine culture     Status: None   Collection Time: 07/08/14 12:03 PM  Result Value Ref Range   Colony Count 40,000 COLONIES/ML    Organism ID, Bacteria Multiple bacterial morphotypes present, none    Organism ID, Bacteria predominant. Suggest appropriate recollection if     Organism ID, Bacteria clinically indicated.   Urine cytology ancillary only     Status: None   Collection Time: 07/17/14 12:00 AM  Result Value Ref Range   Chlamydia Negative     Comment: Normal Reference Range - Negative   Neisseria gonorrhea Negative     Comment: Normal Reference Range - Negative   Trichomonas Negative     Comment: Normal Reference Range - Negative  Urine cytology ancillary only     Status: Abnormal   Collection Time: 07/17/14 12:00 AM  Result Value Ref Range   Bacterial vaginitis **POSITIVE for Gardnerella vaginalis** (A)     Comment: Normal Reference Range - Negative  POCT Urinalysis Dipstick     Status: None    Collection Time: 07/17/14  1:23 PM  Result Value Ref Range   Color, UA yellow    Clarity, UA clear    Glucose, UA neg    Bilirubin, UA neg    Ketones, UA neg    Spec Grav, UA 1.010    Blood, UA neg    pH, UA  6.0    Protein, UA neg    Urobilinogen, UA 0.2    Nitrite, UA neg    Leukocytes, UA Negative   Urine Culture     Status: None   Collection Time: 07/17/14  1:37 PM  Result Value Ref Range   Colony Count >=100,000 COLONIES/ML    Organism ID, Bacteria Multiple bacterial morphotypes present, none    Organism ID, Bacteria predominant. Suggest appropriate recollection if     Organism ID, Bacteria clinically indicated.   CBC w/Diff     Status: None   Collection Time: 07/17/14  1:47 PM  Result Value Ref Range   WBC 6.4 4.0 - 10.5 K/uL   RBC 4.36 3.87 - 5.11 Mil/uL   Hemoglobin 13.5 12.0 - 15.0 g/dL   HCT 40.7 36.0 - 46.0 %   MCV 93.5 78.0 - 100.0 fl   MCHC 33.1 30.0 - 36.0 g/dL   RDW 13.8 11.5 - 15.5 %   Platelets 311.0 150.0 - 400.0 K/uL   Neutrophils Relative % 55.6 43.0 - 77.0 %   Lymphocytes Relative 33.5 12.0 - 46.0 %   Monocytes Relative 7.0 3.0 - 12.0 %   Eosinophils Relative 3.3 0.0 - 5.0 %   Basophils Relative 0.6 0.0 - 3.0 %   Neutro Abs 3.6 1.4 - 7.7 K/uL   Lymphs Abs 2.1 0.7 - 4.0 K/uL   Monocytes Absolute 0.4 0.1 - 1.0 K/uL   Eosinophils Absolute 0.2 0.0 - 0.7 K/uL   Basophils Absolute 0.0 0.0 - 0.1 K/uL  POCT Urinalysis Dipstick     Status: Normal   Collection Time: 08/19/14  1:26 PM  Result Value Ref Range   Color, UA Pale Yellow    Clarity, UA Cloudy    Glucose, UA Neg    Bilirubin, UA Neg    Ketones, UA Neg    Spec Grav, UA <=1.005    Blood, UA Neg    pH, UA 6.0    Protein, UA Neg    Urobilinogen, UA 0.2    Nitrite, UA Neg    Leukocytes, UA Negative Negative  CBC w/Diff     Status: None   Collection Time: 08/19/14  2:25 PM  Result Value Ref Range   WBC 7.0 4.0 - 10.5 K/uL   RBC 4.43 3.87 - 5.11 Mil/uL   Hemoglobin 13.8 12.0 - 15.0 g/dL   HCT  40.8 36.0 - 46.0 %   MCV 92.2 78.0 - 100.0 fl   MCHC 33.7 30.0 - 36.0 g/dL   RDW 13.5 11.5 - 15.5 %   Platelets 234.0 150.0 - 400.0 K/uL   Neutrophils Relative % 51.5 43.0 - 77.0 %   Lymphocytes Relative 40.1 12.0 - 46.0 %   Monocytes Relative 5.7 3.0 - 12.0 %   Eosinophils Relative 2.0 0.0 - 5.0 %   Basophils Relative 0.7 0.0 - 3.0 %   Neutro Abs 3.6 1.4 - 7.7 K/uL   Lymphs Abs 2.8 0.7 - 4.0 K/uL   Monocytes Absolute 0.4 0.1 - 1.0 K/uL   Eosinophils Absolute 0.1 0.0 - 0.7 K/uL   Basophils Absolute 0.0 0.0 - 0.1 K/uL  Comprehensive metabolic panel     Status: None   Collection Time: 08/19/14  2:25 PM  Result Value Ref Range   Sodium 141 135 - 145 mEq/L   Potassium 3.9 3.5 - 5.1 mEq/L   Chloride 103 96 - 112 mEq/L   CO2 28 19 - 32 mEq/L   Glucose, Bld 77 70 -  99 mg/dL   BUN 8 6 - 23 mg/dL   Creatinine, Ser 0.64 0.40 - 1.20 mg/dL   Total Bilirubin 0.2 0.2 - 1.2 mg/dL   Alkaline Phosphatase 98 39 - 117 U/L   AST 20 0 - 37 U/L   ALT 17 0 - 35 U/L   Total Protein 7.6 6.0 - 8.3 g/dL   Albumin 4.2 3.5 - 5.2 g/dL   Calcium 9.8 8.4 - 10.5 mg/dL   GFR 106.82 >60.00 mL/min  Lipid panel     Status: None   Collection Time: 09/25/14 11:14 AM  Result Value Ref Range   Cholesterol 154 0 - 200 mg/dL    Comment: ATP III Classification       Desirable:  < 200 mg/dL               Borderline High:  200 - 239 mg/dL          High:  > = 240 mg/dL   Triglycerides 106.0 0.0 - 149.0 mg/dL    Comment: Normal:  <150 mg/dLBorderline High:  150 - 199 mg/dL   HDL 58.50 >39.00 mg/dL   VLDL 21.2 0.0 - 40.0 mg/dL   LDL Cholesterol 74 0 - 99 mg/dL   Total CHOL/HDL Ratio 3     Comment:                Men          Women1/2 Average Risk     3.4          3.3Average Risk          5.0          4.42X Average Risk          9.6          7.13X Average Risk          15.0          11.0                       NonHDL 95.22     Comment: NOTE:  Non-HDL goal should be 30 mg/dL higher than patient's LDL goal (i.e. LDL goal  of < 70 mg/dL, would have non-HDL goal of < 100 mg/dL)  Basic metabolic panel     Status: None   Collection Time: 09/25/14 11:14 AM  Result Value Ref Range   Sodium 139 135 - 145 mEq/L   Potassium 3.8 3.5 - 5.1 mEq/L   Chloride 103 96 - 112 mEq/L   CO2 28 19 - 32 mEq/L   Glucose, Bld 85 70 - 99 mg/dL   BUN 7 6 - 23 mg/dL   Creatinine, Ser 0.65 0.40 - 1.20 mg/dL   Calcium 9.4 8.4 - 10.5 mg/dL   GFR 104.88 >60.00 mL/min  Hepatic function panel     Status: None   Collection Time: 09/25/14 11:14 AM  Result Value Ref Range   Total Bilirubin 0.3 0.2 - 1.2 mg/dL   Bilirubin, Direct 0.0 0.0 - 0.3 mg/dL   Alkaline Phosphatase 101 39 - 117 U/L   AST 19 0 - 37 U/L   ALT 18 0 - 35 U/L   Total Protein 7.2 6.0 - 8.3 g/dL   Albumin 4.0 3.5 - 5.2 g/dL  POCT urinalysis dipstick     Status: None   Collection Time: 09/30/14  4:33 PM  Result Value Ref Range   Color, UA light-yellow    Clarity, UA clear  Glucose, UA neg    Bilirubin, UA neg    Ketones, UA neg    Spec Grav, UA 1.015    Blood, UA neg    pH, UA 5.5    Protein, UA neg    Urobilinogen, UA 0.2    Nitrite, UA neg    Leukocytes, UA Negative Negative  Lipase     Status: None   Collection Time: 09/30/14  4:53 PM  Result Value Ref Range   Lipase 45.0 11.0 - 59.0 U/L  CBC w/Diff     Status: None   Collection Time: 09/30/14  4:53 PM  Result Value Ref Range   WBC 8.7 4.0 - 10.5 K/uL   RBC 4.53 3.87 - 5.11 Mil/uL   Hemoglobin 14.1 12.0 - 15.0 g/dL   HCT 41.8 36.0 - 46.0 %   MCV 92.3 78.0 - 100.0 fl   MCHC 33.7 30.0 - 36.0 g/dL   RDW 13.5 11.5 - 15.5 %   Platelets 272.0 150.0 - 400.0 K/uL   Neutrophils Relative % 49.5 43.0 - 77.0 %   Lymphocytes Relative 43.2 12.0 - 46.0 %   Monocytes Relative 4.8 3.0 - 12.0 %   Eosinophils Relative 1.7 0.0 - 5.0 %   Basophils Relative 0.8 0.0 - 3.0 %   Neutro Abs 4.3 1.4 - 7.7 K/uL   Lymphs Abs 3.7 0.7 - 4.0 K/uL   Monocytes Absolute 0.4 0.1 - 1.0 K/uL   Eosinophils Absolute 0.2 0.0 - 0.7  K/uL   Basophils Absolute 0.1 0.0 - 0.1 K/uL  Comp Met (CMET)     Status: None   Collection Time: 09/30/14  4:53 PM  Result Value Ref Range   Sodium 140 135 - 145 mEq/L   Potassium 3.6 3.5 - 5.1 mEq/L   Chloride 103 96 - 112 mEq/L   CO2 26 19 - 32 mEq/L   Glucose, Bld 96 70 - 99 mg/dL   BUN 6 6 - 23 mg/dL   Creatinine, Ser 0.71 0.40 - 1.20 mg/dL   Total Bilirubin 0.3 0.2 - 1.2 mg/dL   Alkaline Phosphatase 108 39 - 117 U/L   AST 19 0 - 37 U/L   ALT 14 0 - 35 U/L   Total Protein 7.9 6.0 - 8.3 g/dL   Albumin 4.3 3.5 - 5.2 g/dL   Calcium 9.6 8.4 - 10.5 mg/dL   GFR 94.71 >60.00 mL/min  H. pylori antibody, IgG     Status: Abnormal   Collection Time: 09/30/14  4:53 PM  Result Value Ref Range   H Pylori IgG Positive (A) Negative  Urine Microscopic Only     Status: Abnormal   Collection Time: 09/30/14  4:53 PM  Result Value Ref Range   WBC, UA none seen 0-2/hpf   RBC / HPF none seen 0-2/hpf   Squamous Epithelial / LPF Few(5-10/hpf) (A) Rare(0-4/hpf)    Assessment/Plan: Pain in the abdomen Must r/o acute abdomen cause of symptoms. Will check CBC, CMP, Lipase. Urine dip unremarkable. Will send for micro. Will obtain DG chest and abdomen. Suspect probable H. Pylori infection/PUD given previous symptoms although milder than current symptomology. Patient to start liquid diet until results are in. Rx hydrocodone for pain. Will alter regimen based on results. Alarm signs/symptoms discussed with patient. ER if anything worsens.

## 2014-10-02 NOTE — Assessment & Plan Note (Signed)
Must r/o acute abdomen cause of symptoms. Will check CBC, CMP, Lipase. Urine dip unremarkable. Will send for micro. Will obtain DG chest and abdomen. Suspect probable H. Pylori infection/PUD given previous symptoms although milder than current symptomology. Patient to start liquid diet until results are in. Rx hydrocodone for pain. Will alter regimen based on results. Alarm signs/symptoms discussed with patient. ER if anything worsens.

## 2014-10-05 ENCOUNTER — Other Ambulatory Visit: Payer: Self-pay | Admitting: Family Medicine

## 2014-10-06 NOTE — Telephone Encounter (Signed)
Medication filled to pharmacy as requested.   

## 2014-10-15 ENCOUNTER — Encounter: Payer: Self-pay | Admitting: Physician Assistant

## 2014-10-15 ENCOUNTER — Ambulatory Visit (INDEPENDENT_AMBULATORY_CARE_PROVIDER_SITE_OTHER): Payer: Medicare Other | Admitting: Physician Assistant

## 2014-10-15 VITALS — BP 102/68 | HR 123 | Temp 98.2°F | Resp 16 | Ht 63.0 in | Wt 171.5 lb

## 2014-10-15 DIAGNOSIS — R109 Unspecified abdominal pain: Secondary | ICD-10-CM

## 2014-10-15 DIAGNOSIS — B9681 Helicobacter pylori [H. pylori] as the cause of diseases classified elsewhere: Secondary | ICD-10-CM

## 2014-10-15 DIAGNOSIS — A048 Other specified bacterial intestinal infections: Secondary | ICD-10-CM | POA: Insufficient documentation

## 2014-10-15 MED ORDER — METHSCOPOLAMINE BROMIDE 2.5 MG PO TABS: 2.5000 mg | ORAL_TABLET | Freq: Two times a day (BID) | ORAL | Status: DC

## 2014-10-15 NOTE — Patient Instructions (Addendum)
Please start a probiotic daily. I have listed some brands below. -- Align -- Digestive Advantage -- One A Day -- Culturelle  Please try the Pamine in stead of the Bentyl  You will be contacted for an appointment with Gastroenterology. Please keep your phone on so that you can get an appointment scheduled.

## 2014-10-15 NOTE — Progress Notes (Signed)
Patient presents to clinic today for 2-week follow-up of symptomatic H. Pylori infection. Endorses finishing entire antibiotic regimen as directed. Endorses symptoms improved considerably. Still having chronic abdominal cramping despite use of Bentyl. Denies reflux, heartburn or epigastric pain. Endorses a couple loose stools with antibiotic but has resolved.  Past Medical History  Diagnosis Date  . Migraine   . High cholesterol   . Depression   . Anxiety   . Dysarthria     Left arm  . History of stroke     Current Outpatient Prescriptions on File Prior to Visit  Medication Sig Dispense Refill  . albuterol (PROVENTIL HFA;VENTOLIN HFA) 108 (90 BASE) MCG/ACT inhaler Inhale 2 puffs into the lungs every 6 (six) hours as needed for wheezing or shortness of breath. 1 Inhaler 6  . amitriptyline (ELAVIL) 25 MG tablet TAKE 1 TABLET BY MOUTH EVERY NIGHT AT BEDTIME 30 tablet 0  . Ascorbic Acid (VITAMIN C) 1000 MG tablet Take 1,000 mg by mouth daily.    Marland Kitchen aspirin 325 MG tablet Take 325 mg by mouth daily.    Marland Kitchen atorvastatin (LIPITOR) 20 MG tablet TAKE 1 TABLET BY MOUTH EVERY DAY 30 tablet 6  . baclofen (LIORESAL) 10 MG tablet Take 1 tablet in the am, 1 tablet in the afternoon, and 2 tablets in the pm. 120 each 3  . cetirizine (ZYRTEC) 10 MG tablet Take 1 tablet (10 mg total) by mouth daily. 30 tablet 11  . cyproheptadine (PERIACTIN) 4 MG tablet Take 1 tablet (4 mg total) by mouth 3 (three) times daily as needed. 30 tablet 0  . Diclofenac Potassium (CAMBIA) 50 MG PACK Take 1package at earliest onset of headache x1. 1 each 2  . gabapentin (NEURONTIN) 300 MG capsule Take 300 mg by mouth 2 (two) times daily.     Marland Kitchen gabapentin (NEURONTIN) 600 MG tablet TAKE 1 TABLET BY MOUTH EVERY NIGHT AT BEDTIME 30 tablet 3  . Multiple Vitamins-Minerals (MULTIVITAMIN & MINERAL PO) Take by mouth daily.    Marland Kitchen omeprazole (PRILOSEC) 20 MG capsule Take 1 capsule (20 mg total) by mouth 2 (two) times daily before a meal. 60  capsule 0  . promethazine (PHENERGAN) 25 MG tablet TAKE 1 TABLET BY MOUTH EVERY 6 HOURS AS NEEDED FOR NAUSEA AND VOMITING 30 tablet 0  . venlafaxine XR (EFFEXOR-XR) 150 MG 24 hr capsule Take 1 capsule (150 mg total) by mouth daily with breakfast. 30 capsule 1   No current facility-administered medications on file prior to visit.    Allergies  Allergen Reactions  . Acetaminophen Hives  . Fish Allergy     Hives and migraines  . Hydromorphone Other (See Comments)    Could not speak or keep her eyes open  . Migraine Formula  [Aspirin-Acetaminophen-Caffeine] Hives    pt is able to eat shellfish without a problem.   . Ondansetron Nausea And Vomiting  . Tylenol [Acetaminophen]   . Duloxetine Hcl Rash    hallucinations    Family History  Problem Relation Age of Onset  . Cancer Mother     Social History   Social History  . Marital Status: Married    Spouse Name: Marya Amsler   . Number of Children: 3  . Years of Education: 10 th   Occupational History  .      Disabled   Social History Main Topics  . Smoking status: Former Smoker    Quit date: 02/22/2011  . Smokeless tobacco: Never Used  Comment: Quit 2013  . Alcohol Use: No  . Drug Use: Yes    Special: Marijuana  . Sexual Activity: Not Asked   Other Topics Concern  . None   Social History Narrative   Patient lives at home with her husband Belenda Cruise.   Disabled.   Education 10 th   Right handed.   Caffeine six cup of coffee daily.      Review of Systems - See HPI.  All other ROS are negative.  BP 102/68 mmHg  Pulse 123  Temp(Src) 98.2 F (36.8 C) (Oral)  Resp 16  Ht 5' 3"  (1.6 m)  Wt 171 lb 8 oz (77.792 kg)  BMI 30.39 kg/m2  SpO2 98%  Physical Exam  Constitutional: She is oriented to person, place, and time and well-developed, well-nourished, and in no distress.  HENT:  Head: Normocephalic and atraumatic.  Eyes: Conjunctivae are normal.  Cardiovascular: Normal rate, regular rhythm, normal heart sounds and  intact distal pulses.   Pulmonary/Chest: Effort normal and breath sounds normal. No respiratory distress. She has no wheezes. She has no rales. She exhibits no tenderness.  Abdominal: Soft. Bowel sounds are normal. She exhibits no distension and no mass. There is no tenderness. There is no rebound and no guarding.  Neurological: She is alert and oriented to person, place, and time.  Skin: Skin is warm and dry. No rash noted.  Psychiatric: Affect normal.  Vitals reviewed.   Recent Results (from the past 2160 hour(s))  POCT Urinalysis Dipstick     Status: Normal   Collection Time: 08/19/14  1:26 PM  Result Value Ref Range   Color, UA Pale Yellow    Clarity, UA Cloudy    Glucose, UA Neg    Bilirubin, UA Neg    Ketones, UA Neg    Spec Grav, UA <=1.005    Blood, UA Neg    pH, UA 6.0    Protein, UA Neg    Urobilinogen, UA 0.2    Nitrite, UA Neg    Leukocytes, UA Negative Negative  CBC w/Diff     Status: None   Collection Time: 08/19/14  2:25 PM  Result Value Ref Range   WBC 7.0 4.0 - 10.5 K/uL   RBC 4.43 3.87 - 5.11 Mil/uL   Hemoglobin 13.8 12.0 - 15.0 g/dL   HCT 40.8 36.0 - 46.0 %   MCV 92.2 78.0 - 100.0 fl   MCHC 33.7 30.0 - 36.0 g/dL   RDW 13.5 11.5 - 15.5 %   Platelets 234.0 150.0 - 400.0 K/uL   Neutrophils Relative % 51.5 43.0 - 77.0 %   Lymphocytes Relative 40.1 12.0 - 46.0 %   Monocytes Relative 5.7 3.0 - 12.0 %   Eosinophils Relative 2.0 0.0 - 5.0 %   Basophils Relative 0.7 0.0 - 3.0 %   Neutro Abs 3.6 1.4 - 7.7 K/uL   Lymphs Abs 2.8 0.7 - 4.0 K/uL   Monocytes Absolute 0.4 0.1 - 1.0 K/uL   Eosinophils Absolute 0.1 0.0 - 0.7 K/uL   Basophils Absolute 0.0 0.0 - 0.1 K/uL  Comprehensive metabolic panel     Status: None   Collection Time: 08/19/14  2:25 PM  Result Value Ref Range   Sodium 141 135 - 145 mEq/L   Potassium 3.9 3.5 - 5.1 mEq/L   Chloride 103 96 - 112 mEq/L   CO2 28 19 - 32 mEq/L   Glucose, Bld 77 70 - 99 mg/dL   BUN 8 6 - 23  mg/dL   Creatinine, Ser  0.64 0.40 - 1.20 mg/dL   Total Bilirubin 0.2 0.2 - 1.2 mg/dL   Alkaline Phosphatase 98 39 - 117 U/L   AST 20 0 - 37 U/L   ALT 17 0 - 35 U/L   Total Protein 7.6 6.0 - 8.3 g/dL   Albumin 4.2 3.5 - 5.2 g/dL   Calcium 9.8 8.4 - 10.5 mg/dL   GFR 106.82 >60.00 mL/min  Lipid panel     Status: None   Collection Time: 09/25/14 11:14 AM  Result Value Ref Range   Cholesterol 154 0 - 200 mg/dL    Comment: ATP III Classification       Desirable:  < 200 mg/dL               Borderline High:  200 - 239 mg/dL          High:  > = 240 mg/dL   Triglycerides 106.0 0.0 - 149.0 mg/dL    Comment: Normal:  <150 mg/dLBorderline High:  150 - 199 mg/dL   HDL 58.50 >39.00 mg/dL   VLDL 21.2 0.0 - 40.0 mg/dL   LDL Cholesterol 74 0 - 99 mg/dL   Total CHOL/HDL Ratio 3     Comment:                Men          Women1/2 Average Risk     3.4          3.3Average Risk          5.0          4.42X Average Risk          9.6          7.13X Average Risk          15.0          11.0                       NonHDL 95.22     Comment: NOTE:  Non-HDL goal should be 30 mg/dL higher than patient's LDL goal (i.e. LDL goal of < 70 mg/dL, would have non-HDL goal of < 100 mg/dL)  Basic metabolic panel     Status: None   Collection Time: 09/25/14 11:14 AM  Result Value Ref Range   Sodium 139 135 - 145 mEq/L   Potassium 3.8 3.5 - 5.1 mEq/L   Chloride 103 96 - 112 mEq/L   CO2 28 19 - 32 mEq/L   Glucose, Bld 85 70 - 99 mg/dL   BUN 7 6 - 23 mg/dL   Creatinine, Ser 0.65 0.40 - 1.20 mg/dL   Calcium 9.4 8.4 - 10.5 mg/dL   GFR 104.88 >60.00 mL/min  Hepatic function panel     Status: None   Collection Time: 09/25/14 11:14 AM  Result Value Ref Range   Total Bilirubin 0.3 0.2 - 1.2 mg/dL   Bilirubin, Direct 0.0 0.0 - 0.3 mg/dL   Alkaline Phosphatase 101 39 - 117 U/L   AST 19 0 - 37 U/L   ALT 18 0 - 35 U/L   Total Protein 7.2 6.0 - 8.3 g/dL   Albumin 4.0 3.5 - 5.2 g/dL  POCT urinalysis dipstick     Status: None   Collection Time: 09/30/14   4:33 PM  Result Value Ref Range   Color, UA light-yellow    Clarity, UA clear    Glucose, UA neg  Bilirubin, UA neg    Ketones, UA neg    Spec Grav, UA 1.015    Blood, UA neg    pH, UA 5.5    Protein, UA neg    Urobilinogen, UA 0.2    Nitrite, UA neg    Leukocytes, UA Negative Negative  Lipase     Status: None   Collection Time: 09/30/14  4:53 PM  Result Value Ref Range   Lipase 45.0 11.0 - 59.0 U/L  CBC w/Diff     Status: None   Collection Time: 09/30/14  4:53 PM  Result Value Ref Range   WBC 8.7 4.0 - 10.5 K/uL   RBC 4.53 3.87 - 5.11 Mil/uL   Hemoglobin 14.1 12.0 - 15.0 g/dL   HCT 41.8 36.0 - 46.0 %   MCV 92.3 78.0 - 100.0 fl   MCHC 33.7 30.0 - 36.0 g/dL   RDW 13.5 11.5 - 15.5 %   Platelets 272.0 150.0 - 400.0 K/uL   Neutrophils Relative % 49.5 43.0 - 77.0 %   Lymphocytes Relative 43.2 12.0 - 46.0 %   Monocytes Relative 4.8 3.0 - 12.0 %   Eosinophils Relative 1.7 0.0 - 5.0 %   Basophils Relative 0.8 0.0 - 3.0 %   Neutro Abs 4.3 1.4 - 7.7 K/uL   Lymphs Abs 3.7 0.7 - 4.0 K/uL   Monocytes Absolute 0.4 0.1 - 1.0 K/uL   Eosinophils Absolute 0.2 0.0 - 0.7 K/uL   Basophils Absolute 0.1 0.0 - 0.1 K/uL  Comp Met (CMET)     Status: None   Collection Time: 09/30/14  4:53 PM  Result Value Ref Range   Sodium 140 135 - 145 mEq/L   Potassium 3.6 3.5 - 5.1 mEq/L   Chloride 103 96 - 112 mEq/L   CO2 26 19 - 32 mEq/L   Glucose, Bld 96 70 - 99 mg/dL   BUN 6 6 - 23 mg/dL   Creatinine, Ser 0.71 0.40 - 1.20 mg/dL   Total Bilirubin 0.3 0.2 - 1.2 mg/dL   Alkaline Phosphatase 108 39 - 117 U/L   AST 19 0 - 37 U/L   ALT 14 0 - 35 U/L   Total Protein 7.9 6.0 - 8.3 g/dL   Albumin 4.3 3.5 - 5.2 g/dL   Calcium 9.6 8.4 - 10.5 mg/dL   GFR 94.71 >60.00 mL/min  H. pylori antibody, IgG     Status: Abnormal   Collection Time: 09/30/14  4:53 PM  Result Value Ref Range   H Pylori IgG Positive (A) Negative  Urine Microscopic Only     Status: Abnormal   Collection Time: 09/30/14  4:53 PM    Result Value Ref Range   WBC, UA none seen 0-2/hpf   RBC / HPF none seen 0-2/hpf   Squamous Epithelial / LPF Few(5-10/hpf) (A) Rare(0-4/hpf)    Assessment/Plan: Abdominal cramping Will attempt trial of Pamine to help with cramping. Daily probiotic. GI referral placed.  H. pylori infection Symptoms resolved with Triple Therapy. GI referral placed giving patient's multiple chronic GI issues.

## 2014-10-15 NOTE — Assessment & Plan Note (Signed)
Symptoms resolved with Triple Therapy. GI referral placed giving patient's multiple chronic GI issues.

## 2014-10-15 NOTE — Assessment & Plan Note (Signed)
Will attempt trial of Pamine to help with cramping. Daily probiotic. GI referral placed.

## 2014-10-15 NOTE — Progress Notes (Signed)
Pre visit review using our clinic review tool, if applicable. No additional management support is needed unless otherwise documented below in the visit note/SLS  

## 2014-10-16 ENCOUNTER — Encounter: Payer: Self-pay | Admitting: Gastroenterology

## 2014-10-17 ENCOUNTER — Telehealth: Payer: Self-pay

## 2014-10-17 NOTE — Telephone Encounter (Signed)
Left a message for patient to return my call. 

## 2014-10-21 ENCOUNTER — Other Ambulatory Visit: Payer: Self-pay | Admitting: Neurology

## 2014-10-21 NOTE — Telephone Encounter (Signed)
Left another message for patient to return my call to try to get previous GI records.

## 2014-10-28 ENCOUNTER — Inpatient Hospital Stay (HOSPITAL_COMMUNITY): Payer: Medicare Other

## 2014-10-28 ENCOUNTER — Inpatient Hospital Stay (HOSPITAL_COMMUNITY)
Admission: EM | Admit: 2014-10-28 | Discharge: 2014-10-28 | DRG: 301 | Disposition: A | Discharge status: Home / Self Care | Payer: Medicare Other | Attending: Family Medicine | Admitting: Family Medicine

## 2014-10-28 ENCOUNTER — Encounter (HOSPITAL_COMMUNITY): Payer: Self-pay | Admitting: Emergency Medicine

## 2014-10-28 ENCOUNTER — Other Ambulatory Visit: Payer: Self-pay | Admitting: Physician Assistant

## 2014-10-28 ENCOUNTER — Emergency Department (HOSPITAL_COMMUNITY): Payer: Medicare Other

## 2014-10-28 ENCOUNTER — Encounter: Payer: Self-pay | Admitting: Gastroenterology

## 2014-10-28 DIAGNOSIS — Z886 Allergy status to analgesic agent status: Secondary | ICD-10-CM

## 2014-10-28 DIAGNOSIS — Z86718 Personal history of other venous thrombosis and embolism: Secondary | ICD-10-CM | POA: Diagnosis not present

## 2014-10-28 DIAGNOSIS — Z91013 Allergy to seafood: Secondary | ICD-10-CM | POA: Diagnosis not present

## 2014-10-28 DIAGNOSIS — J45909 Unspecified asthma, uncomplicated: Secondary | ICD-10-CM | POA: Diagnosis present

## 2014-10-28 DIAGNOSIS — G43909 Migraine, unspecified, not intractable, without status migrainosus: Secondary | ICD-10-CM | POA: Diagnosis not present

## 2014-10-28 DIAGNOSIS — Z8673 Personal history of transient ischemic attack (TIA), and cerebral infarction without residual deficits: Secondary | ICD-10-CM | POA: Diagnosis not present

## 2014-10-28 DIAGNOSIS — I773 Arterial fibromuscular dysplasia: Principal | ICD-10-CM | POA: Diagnosis present

## 2014-10-28 DIAGNOSIS — Z79899 Other long term (current) drug therapy: Secondary | ICD-10-CM

## 2014-10-28 DIAGNOSIS — E785 Hyperlipidemia, unspecified: Secondary | ICD-10-CM | POA: Diagnosis not present

## 2014-10-28 DIAGNOSIS — Z888 Allergy status to other drugs, medicaments and biological substances status: Secondary | ICD-10-CM | POA: Diagnosis not present

## 2014-10-28 DIAGNOSIS — R0789 Other chest pain: Secondary | ICD-10-CM | POA: Diagnosis not present

## 2014-10-28 DIAGNOSIS — Z7982 Long term (current) use of aspirin: Secondary | ICD-10-CM

## 2014-10-28 DIAGNOSIS — R079 Chest pain, unspecified: Secondary | ICD-10-CM

## 2014-10-28 DIAGNOSIS — R110 Nausea: Secondary | ICD-10-CM | POA: Diagnosis not present

## 2014-10-28 DIAGNOSIS — R0602 Shortness of breath: Secondary | ICD-10-CM | POA: Diagnosis not present

## 2014-10-28 DIAGNOSIS — Z87891 Personal history of nicotine dependence: Secondary | ICD-10-CM

## 2014-10-28 DIAGNOSIS — J9811 Atelectasis: Secondary | ICD-10-CM | POA: Diagnosis not present

## 2014-10-28 DIAGNOSIS — M25511 Pain in right shoulder: Secondary | ICD-10-CM | POA: Diagnosis not present

## 2014-10-28 HISTORY — DX: Arterial fibromuscular dysplasia: I77.3

## 2014-10-28 HISTORY — DX: Unspecified asthma, uncomplicated: J45.909

## 2014-10-28 HISTORY — DX: Chest pain, unspecified: R07.9

## 2014-10-28 HISTORY — DX: Hemiplegia, unspecified affecting left nondominant side: G81.94

## 2014-10-28 LAB — NM MYOCAR MULTI W/SPECT W/WALL MOTION / EF
Estimated workload: 1 METS
LV dias vol: 24 mL
LV sys vol: 4 mL
MPHR: 176 {beats}/min
Peak HR: 139 {beats}/min
Percent HR: 78 %
RATE: 0
Rest HR: 92 {beats}/min
SDS: 15
SRS: 6
SSS: 21
TID: 1.36

## 2014-10-28 LAB — BASIC METABOLIC PANEL
Anion gap: 9 (ref 5–15)
BUN: <5 mg/dL — ABNORMAL LOW (ref 6–20)
CO2: 28 mmol/L (ref 22–32)
Calcium: 9.4 mg/dL (ref 8.9–10.3)
Chloride: 101 mmol/L (ref 101–111)
Creatinine, Ser: 0.76 mg/dL (ref 0.44–1.00)
GFR calc Af Amer: >60 mL/min (ref >60)
GFR calc non Af Amer: >60 mL/min (ref >60)
Glucose, Bld: 129 mg/dL — ABNORMAL HIGH (ref 65–99)
Potassium: 3.2 mmol/L — ABNORMAL LOW (ref 3.5–5.1)
Sodium: 138 mmol/L (ref 135–145)

## 2014-10-28 LAB — TROPONIN I: Troponin I: <0.03 ng/mL (ref <0.031)

## 2014-10-28 LAB — CBC
HCT: 42.6 % (ref 36.0–46.0)
Hemoglobin: 13.9 g/dL (ref 12.0–15.0)
MCH: 31 pg (ref 26.0–34.0)
MCHC: 32.6 g/dL (ref 30.0–36.0)
MCV: 95.1 fL (ref 78.0–100.0)
Platelets: 296 10*3/uL (ref 150–400)
RBC: 4.48 MIL/uL (ref 3.87–5.11)
RDW: 13 % (ref 11.5–15.5)
WBC: 12.4 10*3/uL — ABNORMAL HIGH (ref 4.0–10.5)

## 2014-10-28 LAB — I-STAT TROPONIN, ED: Troponin i, poc: 0 ng/mL (ref 0.00–0.08)

## 2014-10-28 MED ORDER — PROMETHAZINE HCL 25 MG/ML IJ SOLN
25.0000 mg | Freq: Once | INTRAMUSCULAR | Status: AC
  Administered 2014-10-28: 25 mg via INTRAVENOUS

## 2014-10-28 MED ORDER — ASPIRIN 325 MG PO TABS: 325.0000 mg | ORAL_TABLET | Freq: Every day | ORAL | Status: DC

## 2014-10-28 MED ORDER — IOHEXOL 350 MG/ML SOLN
100.0000 mL | Freq: Once | INTRAVENOUS | Status: AC | PRN
  Administered 2014-10-28: 100 mL via INTRAVENOUS

## 2014-10-28 MED ORDER — ACETAMINOPHEN 325 MG PO TABS: 650.0000 mg | ORAL_TABLET | ORAL | Status: DC | PRN

## 2014-10-28 MED ORDER — LORATADINE 10 MG PO TABS
10.0000 mg | ORAL_TABLET | Freq: Every day | ORAL | Status: DC
  Filled 2014-10-28: qty 1

## 2014-10-28 MED ORDER — PROMETHAZINE HCL 25 MG/ML IJ SOLN
12.5000 mg | Freq: Once | INTRAMUSCULAR | Status: AC
  Administered 2014-10-28: 12.5 mg via INTRAVENOUS
  Filled 2014-10-28: qty 1

## 2014-10-28 MED ORDER — ALBUTEROL SULFATE HFA 108 (90 BASE) MCG/ACT IN AERS: 2.0000 | INHALATION_SPRAY | Freq: Four times a day (QID) | RESPIRATORY_TRACT | Status: DC | PRN

## 2014-10-28 MED ORDER — ONDANSETRON HCL 4 MG/2ML IJ SOLN: 4.0000 mg | Freq: Four times a day (QID) | INTRAMUSCULAR | Status: DC | PRN

## 2014-10-28 MED ORDER — PROMETHAZINE HCL 25 MG/ML IJ SOLN
INTRAMUSCULAR | Status: AC
  Filled 2014-10-28: qty 1

## 2014-10-28 MED ORDER — PANTOPRAZOLE SODIUM 40 MG PO TBEC: 40.0000 mg | DELAYED_RELEASE_TABLET | Freq: Every day | ORAL | Status: DC

## 2014-10-28 MED ORDER — GABAPENTIN 600 MG PO TABS
600.0000 mg | ORAL_TABLET | Freq: Every day | ORAL | Status: DC
  Filled 2014-10-28: qty 1

## 2014-10-28 MED ORDER — SODIUM CHLORIDE 0.9 % IV BOLUS (SEPSIS)
1000.0000 mL | Freq: Once | INTRAVENOUS | Status: AC
  Administered 2014-10-28: 1000 mL via INTRAVENOUS

## 2014-10-28 MED ORDER — TECHNETIUM TC 99M SESTAMIBI GENERIC - CARDIOLITE
10.0000 | Freq: Once | INTRAVENOUS | Status: AC | PRN
  Administered 2014-10-28: 10 via INTRAVENOUS

## 2014-10-28 MED ORDER — POTASSIUM CHLORIDE CRYS ER 20 MEQ PO TBCR
40.0000 meq | EXTENDED_RELEASE_TABLET | Freq: Once | ORAL | Status: AC
  Administered 2014-10-28: 40 meq via ORAL
  Filled 2014-10-28: qty 2

## 2014-10-28 MED ORDER — GI COCKTAIL ~~LOC~~: 30.0000 mL | Freq: Four times a day (QID) | ORAL | Status: DC | PRN

## 2014-10-28 MED ORDER — ENOXAPARIN SODIUM 40 MG/0.4ML ~~LOC~~ SOLN: 40.0000 mg | SUBCUTANEOUS | Status: DC

## 2014-10-28 MED ORDER — CYCLOBENZAPRINE HCL 7.5 MG PO TABS: 7.5000 mg | ORAL_TABLET | Freq: Three times a day (TID) | ORAL | Status: DC | PRN

## 2014-10-28 MED ORDER — VENLAFAXINE HCL ER 150 MG PO CP24: 150.0000 mg | ORAL_CAPSULE | Freq: Every day | ORAL | Status: DC

## 2014-10-28 MED ORDER — METHSCOPOLAMINE BROMIDE 2.5 MG PO TABS: 2.5000 mg | ORAL_TABLET | Freq: Two times a day (BID) | ORAL | Status: DC

## 2014-10-28 MED ORDER — ONDANSETRON HCL 4 MG/2ML IJ SOLN
INTRAMUSCULAR | Status: AC
  Filled 2014-10-28: qty 2

## 2014-10-28 MED ORDER — TECHNETIUM TC 99M SESTAMIBI GENERIC - CARDIOLITE
30.0000 | Freq: Once | INTRAVENOUS | Status: AC | PRN
  Administered 2014-10-28: 30 via INTRAVENOUS

## 2014-10-28 MED ORDER — AMITRIPTYLINE HCL 25 MG PO TABS: 25.0000 mg | ORAL_TABLET | Freq: Every day | ORAL | Status: DC

## 2014-10-28 MED ORDER — BACLOFEN 10 MG PO TABS
10.0000 mg | ORAL_TABLET | Freq: Three times a day (TID) | ORAL | Status: DC | PRN
  Filled 2014-10-28: qty 1

## 2014-10-28 MED ORDER — MORPHINE SULFATE (PF) 4 MG/ML IV SOLN
4.0000 mg | Freq: Once | INTRAVENOUS | Status: AC
  Administered 2014-10-28: 4 mg via INTRAVENOUS
  Filled 2014-10-28: qty 1

## 2014-10-28 MED ORDER — GABAPENTIN 300 MG PO CAPS: 300.0000 mg | ORAL_CAPSULE | Freq: Two times a day (BID) | ORAL | Status: DC

## 2014-10-28 MED ORDER — MORPHINE SULFATE (PF) 2 MG/ML IV SOLN
2.0000 mg | INTRAVENOUS | Status: DC | PRN
  Administered 2014-10-28 (×2): 2 mg via INTRAVENOUS
  Filled 2014-10-28 (×2): qty 1

## 2014-10-28 MED ORDER — PROMETHAZINE HCL 12.5 MG RE SUPP: 12.5000 mg | Freq: Four times a day (QID) | RECTAL | Status: DC | PRN

## 2014-10-28 MED ORDER — NITROGLYCERIN 0.4 MG SL SUBL
0.4000 mg | SUBLINGUAL_TABLET | SUBLINGUAL | Status: AC | PRN
Start: 2014-10-28 — End: 2014-10-28
  Administered 2014-10-28 (×3): 0.4 mg via SUBLINGUAL
  Filled 2014-10-28: qty 1

## 2014-10-28 MED ORDER — ASPIRIN 81 MG PO CHEW
324.0000 mg | CHEWABLE_TABLET | Freq: Once | ORAL | Status: AC
  Administered 2014-10-28: 324 mg via ORAL
  Filled 2014-10-28: qty 4

## 2014-10-28 MED ORDER — MORPHINE SULFATE (PF) 2 MG/ML IV SOLN
INTRAVENOUS | Status: AC
  Administered 2014-10-28: 2 mg via INTRAVENOUS
  Filled 2014-10-28: qty 1

## 2014-10-28 MED ORDER — CYPROHEPTADINE HCL 4 MG PO TABS
4.0000 mg | ORAL_TABLET | Freq: Three times a day (TID) | ORAL | Status: DC | PRN
  Filled 2014-10-28: qty 1

## 2014-10-28 MED ORDER — REGADENOSON 0.4 MG/5ML IV SOLN
INTRAVENOUS | Status: AC
  Administered 2014-10-28: 0.4 mg
  Filled 2014-10-28: qty 5

## 2014-10-28 MED ORDER — ATORVASTATIN CALCIUM 20 MG PO TABS
20.0000 mg | ORAL_TABLET | Freq: Every day | ORAL | Status: DC
  Filled 2014-10-28: qty 1

## 2014-10-28 NOTE — ED Notes (Addendum)
Nuclear medicine called regarding test; still waiting for stress test. RN requested troponin to be drawn over there. If not possible, call her back.

## 2014-10-28 NOTE — ED Notes (Signed)
Attempted to call report

## 2014-10-28 NOTE — ED Notes (Signed)
Dr Colette Ribas notifed pt still having "chest discomfort". Now 7/10. Pt is drowsy from medication but arouses to answer questions.

## 2014-10-28 NOTE — ED Notes (Signed)
Dr Adrian Blackwater advised has performed d/c paperwork and has sent rx's to pt's pharmacy via e-scribe.

## 2014-10-28 NOTE — Consult Note (Signed)
Patient ID: Courtney Garza MRN: [id number], DOB/AGE: December 12, 1970   Admit date: 10/28/2014   Primary Physician: Neena Rhymes, MD   HPI:   44 y.o. 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   Was sitting at home last note and developed stabbing pain in her chest and shoulder blades. Also developed nausea. Had pain on/off throughout night. Came to ER. ECG and troponin were normal. CP resolved but nausea persisted. CT chest no dissection. Had Myoview last year which was normal.   Myoview ordered which showed extensive gut artifact but no obvious ischemia.    Review of Systems:    Cardiac Review of Systems: {Y] = yes [ ]  = no  Chest Pain [ y   ]  Resting SOB [   ] Exertional SOB  [  ]  Orthopnea [  ]   Pedal Edema [   ]    Palpitations [  ] Syncope  [  ]   Presyncope [   ]  General Review of Systems: [Y] = yes [  ]=no Constitional: recent weight change [  ]; anorexia [  ]; fatigue [  yh]; nausea [  yh]; night sweats [  ]; fever [  ]; or chills [  ];                                                                              Eye : blurred vision [  ]; diplopia [   ]; vision changes [  ];  Amaurosis fugax[  ]; Resp: cough [  ];  wheezing[  ];  hemoptysis[  ]; shortness of breath[  ]; paroxysmal nocturnal dyspnea[  ]; dyspnea on exertion[  ]; or orthopnea[  ];  GI:  gallstones[  ], vomiting[yh  ];  dysphagia[  ]; melena[  ];  hematochezia [  ]; heartburn[  ];   GU: kidney stones [  ]; hematuria[  ];   dysuria [  ];  nocturia[  ];  history of     obstruction [  ];                 Skin: rash, swelling[  ];, hair loss[  ];  peripheral edema[  ];  or itching[  ]; Musculosketetal: myalgias[ yh ];  joint swelling[  ];  joint  erythema[  ];  joint pain[  ];  back pain[  ];  Heme/Lymph: bruising[  ];  bleeding[  ];  anemia[  ];  Neuro: TIA[  ];  headaches[  ];  stroke[ yh ];  vertigo[  ];  seizures[  ];   paresthesias[  ];  difficulty walking[  ];  Psych:depression[  ]; anxiety[  ];  Endocrine: diabetes[  ];  thyroid dysfunction[  ];  Immunizations: Flu [  ]; Pneumococcal[  ];  Other:    Problem List  Past Medical History  Diagnosis Date  . Migraine   . High cholesterol   .  Depression   . Anxiety   . Dysarthria     Left arm  . History of stroke     a. 04/2014 MRI Brain: mod large chronic right MCA territory, chronic right cervical ICA dissection, smooth irregularity of the distal L cervical ICA consistent w/ underlying FMD, occlusion of multiple R MCA branch vessels in area of chronic infarct.   . Asthma   . Fibromuscular dysplasia   . Left hemiparesis   . Chest pain     a. 10/2014 CTA Chest: no dissection.    Past Surgical History  Procedure Laterality Date  . Vaginal hysterectomy    . Appendectomy    . Breast enhancement surgery    . Vena cava filter placement      and removal     Allergies  Allergies  Allergen Reactions  . Acetaminophen Hives  . Fish Allergy     Hives and migraines  . Hydromorphone Other (See Comments)    Could not speak or keep her eyes open  . Migraine Formula  [Aspirin-Acetaminophen-Caffeine] Hives    pt is able to eat shellfish without a problem.   . Ondansetron Nausea And Vomiting  . Tylenol [Acetaminophen] Hives  . Duloxetine Hcl Rash    hallucinations     Home Medications  Prior to Admission medications   Medication Sig Start Date End Date Taking? Authorizing Provider  albuterol (PROVENTIL HFA;VENTOLIN HFA) 108 (90 BASE) MCG/ACT inhaler Inhale 2 puffs into the lungs every 6 (six) hours as needed for wheezing or shortness of breath. 11/13/13  Yes Sheliah Hatch, MD  amitriptyline (ELAVIL) 25 MG tablet TAKE 1 TABLET BY MOUTH EVERY NIGHT AT BEDTIME 10/21/14   Yes Drema Dallas, DO  Ascorbic Acid (VITAMIN C) 1000 MG tablet Take 1,000 mg by mouth daily.   Yes Historical Provider, MD  aspirin 325 MG tablet Take 325 mg by mouth daily.   Yes Historical Provider, MD  atorvastatin (LIPITOR) 20 MG tablet TAKE 1 TABLET BY MOUTH EVERY DAY 06/19/14  Yes Sheliah Hatch, MD  baclofen (LIORESAL) 10 MG tablet Take 1 tablet in the am, 1 tablet in the afternoon, and 2 tablets in the pm. 08/12/14  Yes Drema Dallas, DO  cetirizine (ZYRTEC) 10 MG tablet Take 1 tablet (10 mg total) by mouth daily. 06/03/14  Yes Sheliah Hatch, MD  cyproheptadine (PERIACTIN) 4 MG tablet Take 1 tablet (4 mg total) by mouth 3 (three) times daily as needed. Patient taking differently: Take 4 mg by mouth 3 (three) times daily as needed (breakthrough headaches).  05/01/14  Yes Sheliah Hatch, MD  Diclofenac Potassium (CAMBIA) 50 MG PACK Take 1package at earliest onset of headache x1. 02/24/14  Yes Drema Dallas, DO  gabapentin (NEURONTIN) 300 MG capsule Take 300 mg by mouth 2 (two) times daily.  09/19/14  Yes Historical Provider, MD  gabapentin (NEURONTIN) 600 MG tablet TAKE 1 TABLET BY MOUTH EVERY NIGHT AT BEDTIME 08/18/14  Yes Sheliah Hatch, MD  methscopolamine (PAMINE) 2.5 MG TABS tablet Take 1 tablet (2.5 mg total) by mouth 2 (two) times daily. 10/15/14  Yes Waldon Merl, PA-C  promethazine (PHENERGAN) 25 MG tablet TAKE 1 TABLET BY MOUTH EVERY 6 HOURS AS NEEDED FOR NAUSEA AND VOMITING 10/06/14  Yes Sheliah Hatch, MD  venlafaxine XR (EFFEXOR-XR) 150 MG 24 hr capsule Take 1 capsule (150 mg total) by mouth daily with breakfast. 09/17/14  Yes Sheliah Hatch, MD  omeprazole (PRILOSEC) 20 MG capsule  TAKE 1 CAPSULE(20 MG) BY MOUTH TWICE DAILY BEFORE A MEAL 10/28/14   Waldon Merl, PA-C    Family History  Family History  Problem Relation Age of Onset  . Cancer Mother     Social History  Social History   Social History  . Marital Status: Married    Spouse Name: [name]     . Number of Children: 3  . Years of Education: 10 th   Occupational History  .      Disabled   Social History Main Topics  . Smoking status: Former Smoker    Quit date: 02/22/2011  . Smokeless tobacco: Never Used     Comment: Quit 2013  . Alcohol Use: No  . Drug Use: Yes    Special: Marijuana  . Sexual Activity: Not on file   Other Topics Concern  . Not on file   Social History Narrative   Patient lives at home with her husband [name].   Disabled.   Education 10 th   Right handed.   Caffeine six cup of coffee daily.       Review of Systems General:  No chills, fever, night sweats or weight changes.  Cardiovascular:  No chest pain, dyspnea on exertion, edema, orthopnea, palpitations, paroxysmal nocturnal dyspnea. Dermatological: No rash, lesions/masses Respiratory: No cough, dyspnea Urologic: No hematuria, dysuria Abdominal:   No nausea, vomiting, diarrhea, bright red blood per rectum, melena, or hematemesis Neurologic:  No visual changes, wkns, changes in mental status. All other systems reviewed and are otherwise negative except as noted above.  Physical Exam  Blood pressure 135/89, pulse 116, temperature 97.9 F (36.6 C), temperature source Oral, resp. rate 16, SpO2 98 %.  General: Pleasant, obese woman lying in nuke med scanner. + nausea Psych: Flar affect. Neuro: Alert and oriented X 3.  HEENT: Normal  Neck: Supple without bruits or JVD. Lungs:  Resp regular and unlabored, CTA. Heart: RRR no s3, s4, or murmurs. Abdomen: Soft, non-tender, non-distended, BS + x 4.  Extremities: No clubbing, cyanosis or edema. DP/PT/Radials 2+ and equal bilaterally. Left-sided weakness  Labs  Troponin (Point of Care Test)  Recent Labs  10/28/14 0230  TROPIPOC 0.00   No results for input(s): CKTOTAL, CKMB, TROPONINI in the last 72 hours. Lab Results  Component Value Date   WBC 12.4* 10/28/2014   HGB 13.9 10/28/2014   HCT 42.6 10/28/2014   MCV 95.1 10/28/2014    PLT 296 10/28/2014    Recent Labs Lab 10/28/14 0220  NA 138  K 3.2*  CL 101  CO2 28  BUN <5*  CREATININE 0.76  CALCIUM 9.4  GLUCOSE 129*   Lab Results  Component Value Date   CHOL 154 09/25/2014   HDL 58.50 09/25/2014   LDLCALC 74 09/25/2014   TRIG 106.0 09/25/2014   No results found for: DDIMER   Radiology/Studies  Dg Chest 2 View  10/28/2014   CLINICAL DATA:  44 year old female with pain in the right scapula  EXAM: CHEST  2 VIEW  COMPARISON:  Chest radiograph dated 09/30/2014  FINDINGS: The heart size and mediastinal contours are within normal limits. Both lungs are clear. The visualized skeletal structures are unremarkable.  IMPRESSION: No active cardiopulmonary disease.   Electronically Signed   By: Elgie Collard M.D.   On: 10/28/2014 02:32   Dg Chest 2 View  10/01/2014   CLINICAL DATA:  Left-sided chest pain under the breast for the past week, history of asthma, previous CVA, previous smoker.  EXAM: CHEST  2 VIEW  COMPARISON:  Chest x-ray of April 02, 2014  FINDINGS: The lungs are well-expanded. There is no focal infiltrate. There is no pleural effusion. The interstitial markings are mildly prominent bilaterally. The heart and pulmonary vascularity are normal. The mediastinum is normal in width. The bony thorax exhibits no acute abnormality. Where visualized the ribs are unremarkable. There are bilateral breast implants.  IMPRESSION: 1. Mild interstitial prominence may reflect the patient's history of asthma and tobacco use. There is no pneumonia nor other acute pulmonary parenchymal abnormality. There is no CHF. 2. The observed portions of the bony thorax are unremarkable.   Electronically Signed   By: David  [place] M.D.   On: 10/01/2014 08:17   Dg Abd 2 Views  10/01/2014   CLINICAL DATA:  Initial encounter for 1 week history of left-sided pain radiating to the epigastric region with nausea, vomiting, and diarrhea.  EXAM: ABDOMEN - 2 VIEW  COMPARISON:  None.  FINDINGS:  Upright film shows no evidence for intraperitoneal free air. There is no evidence for gaseous bowel dilation to suggest obstruction. Scattered phleboliths are seen in the anatomic pelvis. Visualized bony anatomy is unremarkable.  IMPRESSION: Negative.   Electronically Signed   By: Kennith Center M.D.   On: 10/01/2014 08:16   Ct Angio Chest Aorta W/cm &/or Wo/cm  10/28/2014   CLINICAL DATA:  Central chest heaviness with shortness of breath and nausea last night. Pain radiates to the right upper back and right shoulder. History of fibromuscular dysplasia and multiple dissections.  EXAM: CT ANGIOGRAPHY CHEST, ABDOMEN AND PELVIS  TECHNIQUE: Multidetector CT imaging through the chest, abdomen and pelvis was performed using the standard protocol during bolus administration of intravenous contrast. Multiplanar reconstructed images and MIPs were obtained and reviewed to evaluate the vascular anatomy.  CONTRAST:  OMNIPAQUE IOHEXOL 350 MG/ML SOLN  COMPARISON:  None.  FINDINGS: CTA CHEST FINDINGS  Noncontrast CT images of the chest demonstrate residual contrast material in the esophagus. Scattered calcification in the aorta. No evidence of intramural hematoma. Bilateral breast implants.  Arterial phase contrast-enhanced images of the chest demonstrate normal heart size. Normal caliber thoracic aorta. No evidence of aortic dissection or aneurysm. Great vessel origins are patent. Esophagus is mildly dilated which may indicate reflux or dysmotility. No significant lymphadenopathy in the chest.  Atelectasis in the lungs, likely dependent. Motion artifact limits evaluation of the lungs but no focal consolidation is suggested. No pneumothorax. No pleural effusions.  Review of the MIP images confirms the above findings.  CTA ABDOMEN AND PELVIS FINDINGS  Normal caliber abdominal aorta. No aortic dissection or aneurysm. The abdominal aorta, celiac axis, superior mesenteric artery, bilateral single renal arteries, inferior  mesenteric artery, and bilateral iliac, external iliac, internal iliac, and common femoral arteries are patent. Renal nephrograms are symmetrical.  The liver, spleen, gallbladder, pancreas, adrenal glands, kidneys, inferior vena cava, and retroperitoneal lymph nodes are unremarkable. Stomach, small bowel, and colon are not abnormally distended. No free air or free fluid in the abdomen.  Pelvis: Appendix is not identified. Uterus is surgically absent. No pelvic mass or lymphadenopathy. Bladder wall is not thickened. No free or loculated pelvic fluid collections. No pelvic mass or lymphadenopathy. No destructive bone lesions.  Review of the MIP images confirms the above findings.  IMPRESSION: No evidence of aortic dissection or aneurysm. Major branch vessels appear patent. Atelectasis in the lungs. No focal acute process demonstrated in the abdomen or pelvis.   Electronically Signed   By:  Burman Nieves M.D.   On: 10/28/2014 05:56   Ct Cta Abd/pel W/cm &/or W/o Cm  10/28/2014   CLINICAL DATA:  Central chest heaviness with shortness of breath and nausea last night. Pain radiates to the right upper back and right shoulder. History of fibromuscular dysplasia and multiple dissections.  EXAM: CT ANGIOGRAPHY CHEST, ABDOMEN AND PELVIS  TECHNIQUE: Multidetector CT imaging through the chest, abdomen and pelvis was performed using the standard protocol during bolus administration of intravenous contrast. Multiplanar reconstructed images and MIPs were obtained and reviewed to evaluate the vascular anatomy.  CONTRAST:  OMNIPAQUE IOHEXOL 350 MG/ML SOLN  COMPARISON:  None.  FINDINGS: CTA CHEST FINDINGS  Noncontrast CT images of the chest demonstrate residual contrast material in the esophagus. Scattered calcification in the aorta. No evidence of intramural hematoma. Bilateral breast implants.  Arterial phase contrast-enhanced images of the chest demonstrate normal heart size. Normal caliber thoracic aorta. No evidence of  aortic dissection or aneurysm. Great vessel origins are patent. Esophagus is mildly dilated which may indicate reflux or dysmotility. No significant lymphadenopathy in the chest.  Atelectasis in the lungs, likely dependent. Motion artifact limits evaluation of the lungs but no focal consolidation is suggested. No pneumothorax. No pleural effusions.  Review of the MIP images confirms the above findings.  CTA ABDOMEN AND PELVIS FINDINGS  Normal caliber abdominal aorta. No aortic dissection or aneurysm. The abdominal aorta, celiac axis, superior mesenteric artery, bilateral single renal arteries, inferior mesenteric artery, and bilateral iliac, external iliac, internal iliac, and common femoral arteries are patent. Renal nephrograms are symmetrical.  The liver, spleen, gallbladder, pancreas, adrenal glands, kidneys, inferior vena cava, and retroperitoneal lymph nodes are unremarkable. Stomach, small bowel, and colon are not abnormally distended. No free air or free fluid in the abdomen.  Pelvis: Appendix is not identified. Uterus is surgically absent. No pelvic mass or lymphadenopathy. Bladder wall is not thickened. No free or loculated pelvic fluid collections. No pelvic mass or lymphadenopathy. No destructive bone lesions.  Review of the MIP images confirms the above findings.  IMPRESSION: No evidence of aortic dissection or aneurysm. Major branch vessels appear patent. Atelectasis in the lungs. No focal acute process demonstrated in the abdomen or pelvis.   Electronically Signed   By: Burman Nieves M.D.   On: 10/28/2014 05:56    ECG  Sinus tach, 113, no acute st/t changes.  ASSESSMENT AND PLAN  1. Chest pain  2. FMD with multiple vascular aneurysms 3. CVA with L-sided weakness   Signed, Nicolasa Ducking, NP 10/28/2014, 4:36 PM   Patient seen and examined independently. Gilford Raid, NP note reviewed carefully - agree with his assessment and plan. I have edited the note based on my findings.    Patient with significant arterial disease due to FMD but no documented h/o CAD.  Presents with CP with typical and atypical features. ECG and troponins normal.   Nuclear stress images reviewed personally. Images degraded by gut attenuation but overall felt to be low risk. Had Myoview last year that was negative.   Suspect this is noncardiac CP. No further w/u necessary from our standpoint. Given FMD recommend continuation of ASA and statin.   We will sign off. Please call with questions.   Ocean Schildt,MD 4:50 PM

## 2014-10-28 NOTE — ED Notes (Addendum)
PT IS NPO. Home meds will be held at this time.

## 2014-10-28 NOTE — ED Notes (Signed)
Pt.'s spouse : Roque Cash. No. for updates [phone] .

## 2014-10-28 NOTE — ED Notes (Signed)
Dr Adrian Blackwater aware pt c/o nausea and pain - advised to administer Morphine  IV - given. Pt's spouse has returned to bedside.

## 2014-10-28 NOTE — Progress Notes (Signed)
   Patient seen and examined. ECG and troponins normal.   Nuclear stress images reviewed personally. Images degraded by gut attenuation but overall felt to be low risk. Had Myoview last year that was negative.   Suspect this is noncardiac CP. No further w/u necessary from our standpoint.   We will sign off. Full note pending.   Emiko Osorto,MD 4:40 PM

## 2014-10-28 NOTE — ED Notes (Signed)
Pt. reports central chest "heaviness" with SOB and nausea onset last night , pain radiating to right upper back and right shoulder .

## 2014-10-28 NOTE — ED Notes (Signed)
Pt reports he pain is worsening; RN explained she her BP too low for morphine at this time. Updated on POC. Pt alert and conversive at this time. No signs of distress. Watching TV.

## 2014-10-28 NOTE — ED Notes (Signed)
Report given to Becky, RN 

## 2014-10-28 NOTE — ED Notes (Signed)
C Price, RN, called [facility] - was advised they are waiting on orders from ordering MD d/t pt unable to tolerate at this time.

## 2014-10-28 NOTE — H&P (Addendum)
History and Physical  LAVERLE PILLARD JXB:[id number] DOB: 1971/01/14 DOA: 10/28/2014  Referring physician: Dr Elesa Massed, ED physician PCP: Neena Rhymes, MD   Chief Complaint: Chest pain  HPI: Courtney Garza is a 44 y.o. 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 She presents to the hospital with central pressure-like chest pain that started last night and increased. Due to increasing chest pressure, the patient presented to the hospital for evaluation. She noted radiation of pain to her jaw and her back.  She received 3 doses of nitroglycerin with improvement of her chest pain. She denies provoking factors.  Review of Systems:   Pt complains of  headache from nitroglycerin glycerin, nausea.  Pt denies any fevers, chills, vomiting, diarrhea, constipation, abdominal pain, shortness of breath, dyspnea on exertion, orthopnea, cough, wheezing, palpitations, vision changes, lightheadedness, dizziness, diarrhea, constipation, melena, rectal bleeding.  Review of systems are otherwise negative  Past Medical History  Diagnosis Date  . Migraine   . High cholesterol   . Depression   . Anxiety   . Dysarthria     Left arm  . History of stroke   . Stroke   . Asthma   . Fibromuscular dysplasia   . Left hemiparesis    Past Surgical History  Procedure Laterality Date  . Vaginal hysterectomy    . Appendectomy    . Breast enhancement surgery    . Vena cava filter placement      and removal   Social History:  reports that she quit smoking about 3 years ago. She has never used smokeless tobacco. She reports that she uses illicit drugs (Marijuana). She reports that she does not drink alcohol. Patient lives at  home & is able to participate in activities  of daily living  Allergies  Allergen Reactions  . Acetaminophen Hives  . Fish Allergy     Hives and migraines  . Hydromorphone Other (See Comments)    Could not speak or keep her eyes open  . Migraine Formula  [Aspirin-Acetaminophen-Caffeine] Hives    pt is able to eat shellfish without a problem.   . Ondansetron Nausea And Vomiting  . Tylenol [Acetaminophen] Hives  . Duloxetine Hcl Rash    hallucinations    Family History  Problem Relation Age of Onset  . Cancer Mother      Prior to Admission medications   Medication Sig Start Date End Date Taking? Authorizing Provider  albuterol (PROVENTIL HFA;VENTOLIN HFA) 108 (90 BASE) MCG/ACT inhaler Inhale 2 puffs into the lungs every 6 (six) hours as needed for wheezing or shortness of breath. 11/13/13  Yes Sheliah Hatch, MD  amitriptyline (ELAVIL) 25 MG tablet TAKE 1 TABLET BY MOUTH EVERY NIGHT AT BEDTIME 10/21/14  Yes Drema Dallas, DO  Ascorbic Acid (VITAMIN C) 1000 MG tablet Take 1,000 mg by mouth daily.   Yes Historical Provider, MD  aspirin 325 MG tablet Take 325 mg by mouth daily.   Yes Historical Provider, MD  atorvastatin (LIPITOR) 20 MG tablet TAKE 1 TABLET BY MOUTH EVERY DAY 06/19/14  Yes Sheliah Hatch, MD  baclofen (LIORESAL) 10 MG tablet Take 1 tablet in the am, 1 tablet in the afternoon, and 2 tablets in the pm. 08/12/14  Yes Drema Dallas, DO  cetirizine (ZYRTEC) 10 MG tablet Take 1 tablet (10 mg total) by mouth daily. 06/03/14  Yes Sheliah Hatch, MD  cyproheptadine (PERIACTIN) 4 MG tablet Take 1 tablet (4 mg total) by mouth 3 (three) times daily as needed. Patient taking differently: Take 4 mg by mouth 3 (three) times daily as needed (breakthrough headaches).  05/01/14  Yes Sheliah Hatch, MD  Diclofenac Potassium (CAMBIA) 50 MG PACK Take 1package at earliest onset of headache x1. 02/24/14  Yes Drema Dallas, DO  gabapentin (NEURONTIN) 300 MG capsule Take 300 mg by mouth 2 (two) times daily.  09/19/14  Yes Historical  Provider, MD  gabapentin (NEURONTIN) 600 MG tablet TAKE 1 TABLET BY MOUTH EVERY NIGHT AT BEDTIME 08/18/14  Yes Sheliah Hatch, MD  methscopolamine (PAMINE) 2.5 MG TABS tablet Take 1 tablet (2.5 mg total) by mouth 2 (two) times daily. 10/15/14  Yes Waldon Merl, PA-C  promethazine (PHENERGAN) 25 MG tablet TAKE 1 TABLET BY MOUTH EVERY 6 HOURS AS NEEDED FOR NAUSEA AND VOMITING 10/06/14  Yes Sheliah Hatch, MD  venlafaxine XR (EFFEXOR-XR) 150 MG 24 hr capsule Take 1 capsule (150 mg total) by mouth daily with breakfast. 09/17/14  Yes Sheliah Hatch, MD  omeprazole (PRILOSEC) 20 MG capsule TAKE 1 CAPSULE(20 MG) BY MOUTH TWICE DAILY BEFORE A MEAL 10/28/14   Waldon Merl, PA-C    Physical Exam: BP 100/69 mmHg  Pulse 102  Temp(Src) 97.9 F (36.6 C) (Oral)  Resp 14  SpO2 100%  General: Middle-aged Caucasian female. Awake and alert and oriented x3. No acute cardiopulmonary distress.  Eyes: Pupils equal, round, reactive to light. Extraocular muscles are intact. Sclerae anicteric and noninjected.  ENT: Moist mucosal membranes. No mucosal lesions.  Neck: Neck supple without lymphadenopathy. No carotid bruits. No masses palpated.  Cardiovascular: Regular rate with normal S1-S2 sounds. No murmurs, rubs, gallops auscultated. No JVD.  Respiratory: Good respiratory effort with no wheezes, rales, rhonchi. Lungs clear to auscultation bilaterally.  Abdomen: Soft, nontender, nondistended. Active bowel sounds. No masses or hepatosplenomegaly  Skin: Dry, warm to touch. 2+ dorsalis pedis and radial pulses. Musculoskeletal: No calf or leg pain. All major joints not erythematous nontender.  Psychiatric: Intact judgment and insight.  Neurologic: No focal neurological deficits. Cranial nerves II through XII are grossly intact.           Labs on Admission:  Basic Metabolic Panel:  Recent Labs Lab 10/28/14 0220  NA 138  K 3.2*  CL 101  CO2 28  GLUCOSE 129*  BUN <5*  CREATININE 0.76  CALCIUM  9.4   Liver Function Tests: No results for input(s): AST, ALT, ALKPHOS, BILITOT, PROT, ALBUMIN in the last 168 hours. No results for input(s): LIPASE, AMYLASE in the last 168 hours. No results for input(s): AMMONIA in the last 168 hours. CBC:  Recent Labs Lab 10/28/14 0220  WBC 12.4*  HGB 13.9  HCT 42.6  MCV 95.1  PLT 296   Cardiac Enzymes: No results for input(s): CKTOTAL, CKMB, CKMBINDEX, TROPONINI in the last 168 hours.  BNP (last 3 results) No results for input(s): BNP in the last 8760 hours.  ProBNP (last 3 results) No results for input(s): PROBNP in the last 8760 hours.  CBG: No results for input(s): GLUCAP in the last 168 hours.  Radiological Exams on Admission: Dg Chest 2 View  10/28/2014   CLINICAL DATA:  44 year old female with pain in the right scapula  EXAM: CHEST  2 VIEW  COMPARISON:  Chest radiograph dated 09/30/2014  FINDINGS: The heart size and mediastinal contours are within normal limits. Both lungs are clear. The visualized skeletal structures are unremarkable.  IMPRESSION: No active cardiopulmonary disease.   Electronically Signed   By: Elgie Collard M.D.   On: 10/28/2014 02:32   Ct Angio Chest Aorta W/cm &/or Wo/cm  10/28/2014   CLINICAL DATA:  Central chest heaviness with shortness of breath and nausea last night. Pain radiates to the right upper back and right shoulder. History of fibromuscular dysplasia and multiple dissections.  EXAM: CT ANGIOGRAPHY CHEST, ABDOMEN AND PELVIS  TECHNIQUE: Multidetector CT imaging through the chest, abdomen and pelvis was performed using the standard protocol during bolus administration of intravenous contrast. Multiplanar reconstructed images and MIPs were obtained and reviewed to evaluate the vascular anatomy.  CONTRAST:  OMNIPAQUE IOHEXOL 350 MG/ML SOLN  COMPARISON:  None.  FINDINGS: CTA CHEST FINDINGS  Noncontrast CT images of the chest demonstrate residual contrast material in the esophagus. Scattered calcification  in the aorta. No evidence of intramural hematoma. Bilateral breast implants.  Arterial phase contrast-enhanced images of the chest demonstrate normal heart size. Normal caliber thoracic aorta. No evidence of aortic dissection or aneurysm. Great vessel origins are patent. Esophagus is mildly dilated which may indicate reflux or dysmotility. No significant lymphadenopathy in the chest.  Atelectasis in the lungs, likely dependent. Motion artifact limits evaluation of the lungs but no focal consolidation is suggested. No pneumothorax. No pleural effusions.  Review of the MIP images confirms the above findings.  CTA ABDOMEN AND PELVIS FINDINGS  Normal caliber abdominal aorta. No aortic dissection or aneurysm. The abdominal aorta, celiac axis, superior mesenteric artery, bilateral single renal arteries, inferior mesenteric artery, and bilateral iliac, external iliac, internal iliac, and common femoral arteries are patent. Renal nephrograms are symmetrical.  The liver, spleen, gallbladder, pancreas, adrenal glands, kidneys, inferior vena cava, and retroperitoneal lymph nodes are unremarkable. Stomach, small bowel, and colon are not abnormally distended. No free air or free fluid in the abdomen.  Pelvis: Appendix is not identified. Uterus is surgically absent. No pelvic mass or lymphadenopathy. Bladder wall is not thickened. No free or loculated pelvic fluid collections. No pelvic mass or lymphadenopathy. No destructive bone lesions.  Review of the MIP images confirms the above findings.  IMPRESSION: No evidence of aortic dissection or aneurysm. Major branch vessels appear patent. Atelectasis in the lungs. No focal acute process demonstrated in the abdomen or pelvis.   Electronically Signed   By: Burman Nieves M.D.   On: 10/28/2014 05:56   Ct Cta Abd/pel W/cm &/or W/o Cm  10/28/2014   CLINICAL DATA:  Central chest heaviness with shortness of breath and nausea last night. Pain radiates to the right upper back and right  shoulder. History of fibromuscular dysplasia and multiple dissections.  EXAM: CT ANGIOGRAPHY CHEST, ABDOMEN AND PELVIS  TECHNIQUE: Multidetector CT imaging through the chest, abdomen and pelvis was performed using the standard protocol during bolus administration of intravenous contrast. Multiplanar reconstructed images and MIPs were obtained and reviewed to evaluate the vascular anatomy.  CONTRAST:  OMNIPAQUE IOHEXOL 350 MG/ML SOLN  COMPARISON:  None.  FINDINGS: CTA CHEST FINDINGS  Noncontrast CT images of the chest demonstrate residual contrast material in the esophagus. Scattered calcification in the aorta. No evidence of intramural hematoma. Bilateral breast implants.  Arterial phase contrast-enhanced images of the chest demonstrate normal heart size. Normal caliber thoracic aorta. No evidence of aortic dissection or aneurysm. Great vessel origins are patent. Esophagus  is mildly dilated which may indicate reflux or dysmotility. No significant lymphadenopathy in the chest.  Atelectasis in the lungs, likely dependent. Motion artifact limits evaluation of the lungs but no focal consolidation is suggested. No pneumothorax. No pleural effusions.  Review of the MIP images confirms the above findings.  CTA ABDOMEN AND PELVIS FINDINGS  Normal caliber abdominal aorta. No aortic dissection or aneurysm. The abdominal aorta, celiac axis, superior mesenteric artery, bilateral single renal arteries, inferior mesenteric artery, and bilateral iliac, external iliac, internal iliac, and common femoral arteries are patent. Renal nephrograms are symmetrical.  The liver, spleen, gallbladder, pancreas, adrenal glands, kidneys, inferior vena cava, and retroperitoneal lymph nodes are unremarkable. Stomach, small bowel, and colon are not abnormally distended. No free air or free fluid in the abdomen.  Pelvis: Appendix is not identified. Uterus is surgically absent. No pelvic mass or lymphadenopathy. Bladder wall is not thickened.  No free or loculated pelvic fluid collections. No pelvic mass or lymphadenopathy. No destructive bone lesions.  Review of the MIP images confirms the above findings.  IMPRESSION: No evidence of aortic dissection or aneurysm. Major branch vessels appear patent. Atelectasis in the lungs. No focal acute process demonstrated in the abdomen or pelvis.   Electronically Signed   By: Burman Nieves M.D.   On: 10/28/2014 05:56    EKG: Independently reviewed.  Sinus tachycardia with ventricular rate of 113. Normal intervals. No ST elevation or depression. Negative for STEMI  Assessment/Plan Present on Admission:  . Chest pain . Fibromuscular dysplasia  This patient was discussed with the ED physician, including pertinent vitals, physical exam findings, labs, and imaging.  We also discussed care given by the ED provider.  #1 chest pain #2 fibromuscular dysplasia #3 hyperlipidemia  Admit for observation on telemetry No evidence of dissection Rule out with troponins Morphine 2 mg for chest pain when necessary Check lipid panel Check hemoglobin A1c Consult cardiology for stress testing Continue home medications  DVT prophylaxis:  Lovenox  Consultants:  Cardiology  Code Status:  Full code  Family Communication:  None   Disposition Plan:  Admit to telemetry   Levie Heritage, DO [facility] Pager [phone]

## 2014-10-28 NOTE — ED Notes (Signed)
Pt remains in nuclear

## 2014-10-28 NOTE — ED Notes (Signed)
Pt's spouse at bedside waiting for pt.

## 2014-10-28 NOTE — Discharge Instructions (Signed)
Chest Pain Observation  It is often hard to give a specific diagnosis for the cause of chest pain. Among other possibilities your symptoms might be caused by inadequate oxygen delivery to your heart (angina). Angina that is not treated or evaluated can lead to a heart attack (myocardial infarction) or death.  Blood tests, electrocardiograms, and X-rays may have been done to help determine a possible cause of your chest pain. After evaluation and observation, your health care provider has determined that it is unlikely your pain was caused by an unstable condition that requires hospitalization. However, a full evaluation of your pain may need to be completed, with additional diagnostic testing as directed. It is very important to keep your follow-up appointments. Not keeping your follow-up appointments could result in permanent heart damage, disability, or death. If there is any problem keeping your follow-up appointments, you must call your health care provider.  HOME CARE INSTRUCTIONS   Due to the slight chance that your pain could be angina, it is important to follow your health care provider's treatment plan and also maintain a healthy lifestyle:  · Maintain or work toward achieving a healthy weight.  · Stay physically active and exercise regularly.  · Decrease your salt intake.  · Eat a balanced, healthy diet. Talk to a dietitian to learn about heart-healthy foods.  · Increase your fiber intake by including whole grains, vegetables, fruits, and nuts in your diet.  · Avoid situations that cause stress, anger, or depression.  · Take medicines as advised by your health care provider. Report any side effects to your health care provider. Do not stop medicines or adjust the dosages on your own.  · Quit smoking. Do not use nicotine patches or gum until you check with your health care provider.  · Keep your blood pressure, blood sugar, and cholesterol levels within normal limits.  · Limit alcohol intake to no more than  1 drink per day for women who are not pregnant and 2 drinks per day for men.  · Do not abuse drugs.  SEEK IMMEDIATE MEDICAL CARE IF:  You have severe chest pain or pressure which may include symptoms such as:  · You feel pain or pressure in your arms, neck, jaw, or back.  · You have severe back or abdominal pain, feel sick to your stomach (nauseous), or throw up (vomit).  · You are sweating profusely.  · You are having a fast or irregular heartbeat.  · You feel short of breath while at rest.  · You notice increasing shortness of breath during rest, sleep, or with activity.  · You have chest pain that does not get better after rest or after taking your usual medicine.  · You wake from sleep with chest pain.  · You are unable to sleep because you cannot breathe.  · You develop a frequent cough or you are coughing up blood.  · You feel dizzy, faint, or experience extreme fatigue.  · You develop severe weakness, dizziness, fainting, or chills.  Any of these symptoms may represent a serious problem that is an emergency. Do not wait to see if the symptoms will go away. Call your local emergency services (911 in the [place] Do not drive yourself to the hospital.  MAKE SURE YOU:  · Understand these instructions.  · Will watch your condition.  · Will get help right away if you are not doing well or get worse.  Document Released: 03/12/2010 Document Revised: 02/12/2013 Document Reviewed: 08/09/2012    [employer]® Patient Information ©2015 [employer]. This information is not intended to replace advice given to you by your health care provider. Make sure you discuss any questions you have with your health care provider.

## 2014-10-28 NOTE — ED Notes (Signed)
Patient transported to CT scan . 

## 2014-10-28 NOTE — ED Notes (Signed)
Attempted to give report to 2 Chad.

## 2014-10-28 NOTE — ED Notes (Signed)
Dr. Gust Rung ( hospitalist ) at bedside evaluating pt.

## 2014-10-28 NOTE — ED Notes (Signed)
Dr Adrian Blackwater in w/pt and spouse.

## 2014-10-28 NOTE — ED Notes (Signed)
Pt refused po meds d/t nausea - unable to tolerate.

## 2014-10-28 NOTE — ED Notes (Signed)
Unable to insert peripheral IV access despite several attempts by IV nurses .

## 2014-10-28 NOTE — Discharge Summary (Signed)
Physician Discharge Summary  Patient ID: Courtney Garza MRN: [id number] DOB/AGE: Mar 14, 1970 44 y.o.  Admit date: 10/28/2014 Discharge date: 10/28/2014  Admission Diagnoses: Chest pain Discharge Diagnoses:  Active Problems:   Fibromuscular dysplasia   Chest pain   Discharged Condition: good  Hospital Course: Patient was admitted on 10/28/2014 for chest pain. The patient had negative troponins 2 and a negative stress test. Her chest pain has decreased. Patient is being discharged home as she is in a stable condition with no active cardiac problems. She is instructed to follow-up with her primary care physician within 2 weeks. She is discharged with Flexeril to assist with her pain  Consults: cardiology  Significant Diagnostic Studies: Negative troponins, negative stress test  Treatments: IV morphine, nitroglycerin glycerin  Discharge Exam: Blood pressure 112/62, pulse 103, temperature 97.9 F (36.6 C), temperature source Oral, resp. rate 14, SpO2 99 %. No change in exam  Disposition: Final discharge disposition not confirmed  Discharge Instructions    Call MD for:  difficulty breathing, headache or visual disturbances    Complete by:  As directed      Call MD for:  extreme fatigue    Complete by:  As directed      Call MD for:  persistant dizziness or light-headedness    Complete by:  As directed      Call MD for:  persistant nausea and vomiting    Complete by:  As directed      Call MD for:  redness, tenderness, or signs of infection (pain, swelling, redness, odor or green/yellow discharge around incision site)    Complete by:  As directed      Call MD for:  severe uncontrolled pain    Complete by:  As directed      Call MD for:  temperature >100.4    Complete by:  As directed      Diet - low sodium heart healthy    Complete by:  As directed      Increase activity slowly    Complete by:  As directed             Medication List    STOP taking these medications         baclofen 10 MG tablet  Commonly known as:  LIORESAL      TAKE these medications        albuterol 108 (90 BASE) MCG/ACT inhaler  Commonly known as:  PROVENTIL HFA;VENTOLIN HFA  Inhale 2 puffs into the lungs every 6 (six) hours as needed for wheezing or shortness of breath.     amitriptyline 25 MG tablet  Commonly known as:  ELAVIL  TAKE 1 TABLET BY MOUTH EVERY NIGHT AT BEDTIME     aspirin 325 MG tablet  Take 325 mg by mouth daily.     atorvastatin 20 MG tablet  Commonly known as:  LIPITOR  TAKE 1 TABLET BY MOUTH EVERY DAY     cetirizine 10 MG tablet  Commonly known as:  ZYRTEC  Take 1 tablet (10 mg total) by mouth daily.     cyclobenzaprine 7.5 MG tablet  Commonly known as:  FEXMID  Take 1 tablet (7.5 mg total) by mouth 3 (three) times daily as needed for muscle spasms.     cyproheptadine 4 MG tablet  Commonly known as:  PERIACTIN  Take 1 tablet (4 mg total) by mouth 3 (three) times daily as needed.     Diclofenac Potassium 50 MG Pack  Commonly known  as:  CAMBIA  Take 1package at earliest onset of headache x1.     gabapentin 600 MG tablet  Commonly known as:  NEURONTIN  TAKE 1 TABLET BY MOUTH EVERY NIGHT AT BEDTIME     gabapentin 300 MG capsule  Commonly known as:  NEURONTIN  Take 300 mg by mouth 2 (two) times daily.     methscopolamine 2.5 MG Tabs tablet  Commonly known as:  PAMINE  Take 1 tablet (2.5 mg total) by mouth 2 (two) times daily.     omeprazole 20 MG capsule  Commonly known as:  PRILOSEC  TAKE 1 CAPSULE(20 MG) BY MOUTH TWICE DAILY BEFORE A MEAL     promethazine 25 MG tablet  Commonly known as:  PHENERGAN  TAKE 1 TABLET BY MOUTH EVERY 6 HOURS AS NEEDED FOR NAUSEA AND VOMITING     promethazine 12.5 MG suppository  Commonly known as:  PHENERGAN  Place 1 suppository (12.5 mg total) rectally every 6 (six) hours as needed for nausea or vomiting.     venlafaxine XR 150 MG 24 hr capsule  Commonly known as:  EFFEXOR-XR  Take 1 capsule (150 mg  total) by mouth daily with breakfast.     vitamin C 1000 MG tablet  Take 1,000 mg by mouth daily.           Follow-up Information    Follow up with Neena Rhymes, MD. Schedule an appointment as soon as possible for a visit in 2 weeks.   Specialty:  Family Medicine   Contact information:   [address] [place] [address] [phone]       Signed: Candelaria Celeste JEHIEL 10/28/2014, 5:44 PM

## 2014-10-28 NOTE — ED Notes (Signed)
PT'S SPOUSE LEAVING TO LET HIS DOGS OUT AND THEN WILL RETURN IN APPROX 1 HOUR - [phone].

## 2014-10-28 NOTE — ED Notes (Signed)
Pt to nuclear med

## 2014-10-28 NOTE — ED Notes (Addendum)
Pt returned from [place] via stretcher. Dr Gala Romney in [place] - aware. Phlebotomist at bedside attempting to draw bloodwork.

## 2014-10-28 NOTE — ED Notes (Signed)
Phelb in nuclear medicine to draw troponin.

## 2014-10-28 NOTE — ED Notes (Signed)
Courtney Garza, 3W Secretary, and Pt Placement pt being d/c'd to home.

## 2014-10-28 NOTE — ED Notes (Signed)
Dr. Adriana Reams to see pt upon return. Phleb will be called to collect 2nd troponin.

## 2014-10-28 NOTE — ED Notes (Signed)
This RN has been charting on pt since 0745 today.

## 2014-10-28 NOTE — ED Notes (Signed)
Morphine given for chest " heaviness" 2/10.

## 2014-10-28 NOTE — ED Provider Notes (Signed)
TIME SEEN: 2:28 AM   CHIEF COMPLAINT: Chest Pain  HPI: Courtney Garza is a 44 y.o. 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 She notes the pain began behind her right shoulder blade, after which the CP started as well. She reports associated nausea, SOB, mild facial tingling around the jaw area (resolved) and dizziness. Pt has used her inhaler 2 hours PTA with no relief. She has a PMHx of R carotid, L vertebral, illiac, and celiac aortic dissection. Per relative, pt had "beading" in her arteries because of the FMD. She notes this has been treated with medical management. She is taking aspirin 1x daily. She notes that the CVA that occurred 4 years ago was caused by DVT in her leg. She has not had a stress test before, but no cardiac catheterization. She denies a PMHx of DM. Pt denies vomiting, diaphoresis, numbness, weakness, and diarrhea.  ROS: See HPI Constitutional: no fever  Eyes: no drainage  ENT: no runny nose   Cardiovascular: chest pain  Resp: SOB  GI: no vomiting GU: no dysuria Integumentary: no rash  Allergy: no hives  Musculoskeletal: no leg swelling  Neurological: no slurred speech ROS otherwise negative  PAST MEDICAL HISTORY/PAST SURGICAL HISTORY:  Past Medical History  Diagnosis Date  . Migraine   . High cholesterol   . Depression   . Anxiety   . Dysarthria     Left arm  . History of stroke   . Stroke   . Asthma   . Fibromuscular dysplasia   . Left hemiparesis     MEDICATIONS:  Prior to Admission medications   Medication Sig Start Date End Date Taking? Authorizing Provider  albuterol (PROVENTIL HFA;VENTOLIN HFA) 108 (90 BASE) MCG/ACT inhaler Inhale 2 puffs into the lungs every 6 (six) hours as needed for wheezing or shortness of breath. 11/13/13    Sheliah Hatch, MD  amitriptyline (ELAVIL) 25 MG tablet TAKE 1 TABLET BY MOUTH EVERY NIGHT AT BEDTIME 10/21/14   Drema Dallas, DO  Ascorbic Acid (VITAMIN C) 1000 MG tablet Take 1,000 mg by mouth daily.    Historical Provider, MD  aspirin 325 MG tablet Take 325 mg by mouth daily.    Historical Provider, MD  atorvastatin (LIPITOR) 20 MG tablet TAKE 1 TABLET BY MOUTH EVERY DAY 06/19/14   Sheliah Hatch, MD  baclofen (LIORESAL) 10 MG tablet Take 1 tablet in the am, 1 tablet in the afternoon, and 2 tablets in the pm. 08/12/14   Drema Dallas, DO  cetirizine (ZYRTEC) 10 MG tablet Take 1 tablet (10 mg total) by mouth daily. 06/03/14   Sheliah Hatch, MD  cyproheptadine (PERIACTIN) 4 MG tablet Take 1 tablet (4 mg total) by mouth 3 (three) times daily as needed. 05/01/14   Sheliah Hatch, MD  Diclofenac Potassium (CAMBIA) 50 MG PACK Take 1package at earliest onset of headache x1. 02/24/14   Drema Dallas, DO  gabapentin (NEURONTIN) 300 MG capsule Take 300 mg by mouth 2 (two) times daily.  09/19/14   Historical Provider, MD  gabapentin (NEURONTIN) 600 MG tablet TAKE 1 TABLET BY MOUTH EVERY NIGHT AT BEDTIME 08/18/14   Sheliah Hatch, MD  methscopolamine (PAMINE) 2.5 MG TABS tablet Take 1 tablet (2.5 mg total) by mouth 2 (two) times daily. 10/15/14  Waldon Merl, PA-C  Multiple Vitamins-Minerals (MULTIVITAMIN & MINERAL PO) Take by mouth daily.    Historical Provider, MD  omeprazole (PRILOSEC) 20 MG capsule Take 1 capsule (20 mg total) by mouth 2 (two) times daily before a meal. 10/01/14   Waldon Merl, PA-C  promethazine (PHENERGAN) 25 MG tablet TAKE 1 TABLET BY MOUTH EVERY 6 HOURS AS NEEDED FOR NAUSEA AND VOMITING 10/06/14   Sheliah Hatch, MD  venlafaxine XR (EFFEXOR-XR) 150 MG 24 hr capsule Take 1 capsule (150 mg total) by mouth daily with breakfast. 09/17/14   Sheliah Hatch, MD    ALLERGIES:  Allergies  Allergen Reactions  . Acetaminophen Hives  . Fish Allergy     Hives and  migraines  . Hydromorphone Other (See Comments)    Could not speak or keep her eyes open  . Migraine Formula  [Aspirin-Acetaminophen-Caffeine] Hives    pt is able to eat shellfish without a problem.   . Ondansetron Nausea And Vomiting  . Tylenol [Acetaminophen]   . Duloxetine Hcl Rash    hallucinations    SOCIAL HISTORY:  Social History  Substance Use Topics  . Smoking status: Former Smoker    Quit date: 02/22/2011  . Smokeless tobacco: Never Used     Comment: Quit 2013  . Alcohol Use: No    FAMILY HISTORY: Family History  Problem Relation Age of Onset  . Cancer Mother     EXAM: BP 110/81 mmHg  Pulse 116  Temp(Src) 97.9 F (36.6 C) (Oral)  Resp 16  SpO2 97% CONSTITUTIONAL: Alert and oriented and responds appropriately to questions. Well-appearing; well-nourished HEAD: Normocephalic EYES: Conjunctivae clear, PERRL ENT: normal nose; no rhinorrhea; moist mucous membranes; pharynx without lesions noted NECK: Supple, no meningismus, no LAD  CARD: RRR; S1 and S2 appreciated; no murmurs, no clicks, no rubs, no gallops CHEST: Slight TTP over her chest wall without crepitus, ecchymosis, or deformity RESP: Normal chest excursion without splinting or tachypnea; breath sounds clear and equal bilaterally; no wheezes, no rhonchi, no rales, no hypoxia or respiratory distress, speaking full sentences ABD/GI: Normal bowel sounds; non-distended; soft, non-tender, no rebound, no guarding, no peritoneal signs BACK:  The back appears normal and is TTP around the R scapula without deformity, there is no CVA tenderness, no midline spinal tenderness or step-off or deformity EXT: Normal ROM in all joints; non-tender to palpation; no edema; normal capillary refill; no cyanosis, no calf tenderness or swelling    SKIN: Normal color for age and race; warm NEURO: Moves all extremities equally, sensation to light touch intact diffusely, cranial nerves II through XII intact PSYCH: The patient's mood  and manner are appropriate. Grooming and personal hygiene are appropriate.  MEDICAL DECISION MAKING: Patient here with chest pain that radiates into her back with shortness of breath, nausea and dizziness. Has history of hyperlipidemia, tobacco use. Also has a history of FMD and has had multiple dissections of different arteries.  Has also had a previous DVT.  No previous history of PE. Is on aspirin but no other antiplatelets or anticoagulation. We'll obtain cardiac labs and a CT angio to rule out dissection given her past medical history.  ED PROGRESS: Patient's CT scan shows no dissection. First troponin negative. Pain has improved with nitroglycerin. Discussed with Dr. Julian Reil with hospitalist service for admission for chest pain rule out. No recent history of provocative testing.   EKG Interpretation  Date/Time:  Tuesday October 28 2014 01:59:37 EDT Ventricular Rate:  113 PR Interval:  116 QRS Duration: 72 QT Interval:  312 QTC Calculation: 427 R Axis:   67 Text Interpretation:  Sinus tachycardia Otherwise normal ECG No significant change since last tracing Confirmed by WARD,  DO, KRISTEN ([id number]) on 10/28/2014 2:22:39 AM      Angiocath insertion Performed by: Raelyn Number  Consent: Verbal consent obtained. Risks and benefits: risks, benefits and alternatives were discussed Time out: Immediately prior to procedure a "time out" was called to verify the correct patient, procedure, equipment, support staff and site/side marked as required.  Preparation: Patient was prepped and draped in the usual sterile fashion.  Vein Location: Left AC  Ultrasound Guided  Gauge: 20  Normal blood return and flush without difficulty Patient tolerance: Patient tolerated the procedure well with no immediate complications.     I personally performed the services described in this documentation, which was scribed in my presence. The recorded information has been reviewed and is  accurate.   Layla Maw Ward, DO 10/28/14 [phone]

## 2014-10-29 ENCOUNTER — Telehealth: Payer: Self-pay | Admitting: *Deleted

## 2014-10-29 ENCOUNTER — Telehealth: Payer: Self-pay | Admitting: Family Medicine

## 2014-10-29 ENCOUNTER — Encounter: Payer: Self-pay | Admitting: Neurology

## 2014-10-29 ENCOUNTER — Ambulatory Visit: Payer: Medicare Other | Admitting: Gastroenterology

## 2014-10-29 NOTE — Telephone Encounter (Signed)
Husband said hospital told them to stop the baclofen and start cyclobenzaprine. He is concerned about just stopping a med or if she needs to wean off it. Please call him back.

## 2014-10-29 NOTE — Telephone Encounter (Signed)
Please advise on the way to change this medication? What are best steps for pt to stop Baclofen and begin flexeril?

## 2014-10-29 NOTE — Telephone Encounter (Signed)
Unable to reach patient at time of TCM Call. Left message for patient to return call when available.  

## 2014-10-29 NOTE — Telephone Encounter (Signed)
Scheduled for 11/06/14 11:00am with Dr. Beverely Low.

## 2014-10-30 NOTE — Telephone Encounter (Signed)
Pt was in the ER 10/28/14, but was not admitted.  No TCM.

## 2014-10-30 NOTE — Telephone Encounter (Signed)
No need to wean.  Can do a direct switch

## 2014-10-30 NOTE — Telephone Encounter (Signed)
Called pt husband and left a detailed message informing.

## 2014-11-02 ENCOUNTER — Encounter: Payer: Self-pay | Admitting: Gastroenterology

## 2014-11-06 ENCOUNTER — Ambulatory Visit (INDEPENDENT_AMBULATORY_CARE_PROVIDER_SITE_OTHER): Payer: Medicare Other | Admitting: Family Medicine

## 2014-11-06 ENCOUNTER — Encounter: Payer: Self-pay | Admitting: Family Medicine

## 2014-11-06 VITALS — BP 110/78 | HR 115 | Temp 97.9°F | Resp 16 | Ht 63.0 in | Wt 172.0 lb

## 2014-11-06 DIAGNOSIS — Z23 Encounter for immunization: Secondary | ICD-10-CM

## 2014-11-06 DIAGNOSIS — R0789 Other chest pain: Secondary | ICD-10-CM

## 2014-11-06 MED ORDER — CYCLOBENZAPRINE HCL 7.5 MG PO TABS: 7.5000 mg | ORAL_TABLET | Freq: Three times a day (TID) | ORAL | Status: DC | PRN

## 2014-11-06 NOTE — Assessment & Plan Note (Signed)
New to provider.  Pt has not had recurrence of chest pain since switching to Flexeril from the Baclofen.  Pt had negative Troponin x2 in ER, a low risk nuclear stress test and is asymptomatic here in office.  Continue Flexeril- script given.  Reviewed supportive care and red flags that should prompt return.  Pt expressed understanding and is in agreement w/ plan.

## 2014-11-06 NOTE — Progress Notes (Signed)
Pre visit review using our clinic review tool, if applicable. No additional management support is needed unless otherwise documented below in the visit note. 

## 2014-11-06 NOTE — Patient Instructions (Signed)
Follow up as needed Continue to take the Flexeril Keep up the good work on healthy diet and regular exercise- you look great! Call with any questions or concerns Have a great fall!!!

## 2014-11-06 NOTE — Progress Notes (Signed)
   Subjective:    Patient ID: Courtney Garza, female    DOB: [birth year], 44 y.o.   MRN: [id number]  HPI ER f/u- pt seen in ER 9/6 for CP.  Pt had negative Trop x2.  Was d/c'd w/ dx of musculoskeletal CP and switched off Baclofen and started on Flexeril.  Pt reports CP has improved.  Feels that the chest pain was due to the nerve being compressed.  Feels Flexeril is more effective than Baclofen.  Taking Flexeril TID.  Was not able to complete stress test due to contrast dye making her sick.     Review of Systems For ROS see HPI     Objective:   Physical Exam  Constitutional: She is oriented to person, place, and time. She appears well-developed and well-nourished. No distress.  HENT:  Head: Normocephalic and atraumatic.  Eyes: Conjunctivae and EOM are normal. Pupils are equal, round, and reactive to light.  Cardiovascular:  Tachy but regular S1/S2  Pulmonary/Chest: Effort normal and breath sounds normal. No respiratory distress. She has no wheezes. She has no rales.  Neurological: She is alert and oriented to person, place, and time.  Skin: Skin is warm and dry.  Psychiatric: She has a normal mood and affect. Her behavior is normal. Thought content normal.  Vitals reviewed.         Assessment & Plan:

## 2014-11-12 ENCOUNTER — Ambulatory Visit: Payer: Medicare Other | Admitting: Neurology

## 2014-11-22 ENCOUNTER — Other Ambulatory Visit: Payer: Self-pay | Admitting: Family Medicine

## 2014-11-22 ENCOUNTER — Other Ambulatory Visit: Payer: Self-pay | Admitting: Neurology

## 2014-11-24 NOTE — Telephone Encounter (Signed)
Ok to refill 

## 2014-11-25 ENCOUNTER — Other Ambulatory Visit: Payer: Self-pay | Admitting: Physician Assistant

## 2014-11-25 NOTE — Telephone Encounter (Signed)
Medication filled to pharmacy as requested.   

## 2014-11-25 NOTE — Telephone Encounter (Signed)
Will defer further refills of patient's medications to PCP  

## 2014-11-28 ENCOUNTER — Ambulatory Visit: Payer: Medicare Other | Admitting: Neurology

## 2014-12-06 ENCOUNTER — Other Ambulatory Visit: Payer: Self-pay | Admitting: Family Medicine

## 2014-12-08 ENCOUNTER — Other Ambulatory Visit: Payer: Self-pay

## 2014-12-08 MED ORDER — CYCLOBENZAPRINE HCL 7.5 MG PO TABS: 7.5000 mg | ORAL_TABLET | Freq: Three times a day (TID) | ORAL | Status: DC | PRN

## 2014-12-19 ENCOUNTER — Other Ambulatory Visit: Payer: Self-pay | Admitting: Family Medicine

## 2014-12-19 NOTE — Telephone Encounter (Signed)
Medication filled to pharmacy as requested.   

## 2014-12-22 ENCOUNTER — Telehealth: Payer: Self-pay | Admitting: General Practice

## 2014-12-22 ENCOUNTER — Other Ambulatory Visit: Payer: Self-pay | Admitting: General Practice

## 2014-12-22 MED ORDER — GABAPENTIN 300 MG PO CAPS: 300.0000 mg | ORAL_CAPSULE | Freq: Two times a day (BID) | ORAL | Status: DC

## 2014-12-22 NOTE — Telephone Encounter (Signed)
Called pt to verify how she is taking her gabapentin. We have on file 600mg  nightly at bedtime. We received a fax from [facility] today stating that pt is taking 300mg  gabapentin. (1 capsule by mouth twice daily - morning and afternoon)  LMOVM to return call.

## 2014-12-22 NOTE — Addendum Note (Signed)
Addended by: Geannie RisenBRODMERKEL, JESSICA L on: 12/22/2014 03:23 PM   Modules accepted: Orders

## 2014-12-22 NOTE — Telephone Encounter (Signed)
Spoke with pt husband, she takes both gabapentin prescriptions.

## 2014-12-27 ENCOUNTER — Encounter: Payer: Self-pay | Admitting: Family Medicine

## 2014-12-27 ENCOUNTER — Other Ambulatory Visit: Payer: Self-pay | Admitting: Neurology

## 2014-12-29 MED ORDER — ALBUTEROL SULFATE HFA 108 (90 BASE) MCG/ACT IN AERS: 2.0000 | INHALATION_SPRAY | Freq: Four times a day (QID) | RESPIRATORY_TRACT | Status: DC | PRN

## 2014-12-29 NOTE — Telephone Encounter (Signed)
Medication filled to pharmacy as requested.   

## 2014-12-29 NOTE — Telephone Encounter (Signed)
Last OV: 04/24/14 (did have a few botox injection since that time)\ Next OV: 12/31/14

## 2014-12-30 ENCOUNTER — Ambulatory Visit: Payer: Medicare Other | Admitting: Gastroenterology

## 2014-12-31 ENCOUNTER — Ambulatory Visit (INDEPENDENT_AMBULATORY_CARE_PROVIDER_SITE_OTHER): Payer: Medicare Other | Admitting: Neurology

## 2014-12-31 ENCOUNTER — Ambulatory Visit: Payer: Medicare Other | Admitting: Neurology

## 2014-12-31 DIAGNOSIS — G43709 Chronic migraine without aura, not intractable, without status migrainosus: Secondary | ICD-10-CM

## 2014-12-31 DIAGNOSIS — G8114 Spastic hemiplegia affecting left nondominant side: Secondary | ICD-10-CM | POA: Diagnosis not present

## 2014-12-31 DIAGNOSIS — G8110 Spastic hemiplegia affecting unspecified side: Secondary | ICD-10-CM

## 2014-12-31 MED ORDER — ONABOTULINUMTOXINA 100 UNITS IJ SOLR
400.0000 [IU] | Freq: Once | INTRAMUSCULAR | Status: AC
  Administered 2014-12-31: 400 [IU] via INTRAMUSCULAR

## 2014-12-31 NOTE — Procedures (Signed)
Procedure Note Botox  Attending: Dr. Shon MilletAdam Cosandra Plouffe  I. 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 Risk discussed included, but were not limited pain and discomfort, bleeding, bruising, excessive weakness, venous thrombosis, muscle atrophy and dysphagia. A copy of the patient medication guide was given to the patient which explains the blackbox warning.  Patients identity and treatment sites confirmed Yes. .  Details of Procedure: Skin was cleaned with alcohol. A hollow monopolor needle was introduced to the target muscle. Prior to injection, the needle plunger was aspirated to make sure the needle was not within a blood vessel. There was no blood retrieved on aspiration.   Following is a summary of the muscles injected And the amount of Botulinum toxin used:   Dilution 0.9% preservative free saline mixed with 100 u Botox type A to make 10 U per 0.1cc  Injections  Left flexor digitorum profundus 50 units (total of 3 sites) Left flexor digitorum superficialis 50 units (total of 3 sites) Left flexor carpi ulnaris 25 units (total of 1 site) Left flexor carpi radialis 25 units (total of 1 site) TOTAL UNITS: 150 units  Agent: Botulinum Type A ( Onobotulinum Toxin type A ). 2 vials of Botox were used, each containing 100 units and freshly diluted with 1 mL of sterile, non-preserved saline  Total injected (Units): 150 units Total wasted (Units): 50 units   Pt tolerated procedure well without complications.  Reinjection is anticipated in 3 months.   II. Preoperative Diagnosis(es): Chronic migraine  Consent obtained from: The patient Benefits discussed included, but were not limited to decreased muscle tightness, increased joint range of motion, and  decreased pain. Risk discussed included, but were not limited pain and discomfort, bleeding, bruising, excessive weakness, venous thrombosis, muscle atrophy and dysphagia. A copy of the patient medication guide was given to the patient which explains the blackbox warning.  Patients identity and treatment sites confirmed Yes. .  Details of Procedure: Skin was cleaned with alcohol. Prior to injection, the needle plunger was aspirated to make sure the needle was not within a blood vessel. There was no blood retrieved on aspiration.   Following is a summary of the muscles injected And the amount of Botulinum toxin used:  Dilution 200 units were reconstituted with 4 mL 0.9% sodium chloride. Skin was cleaned with alcohol prior to injection. A 30-gauge, 0.5-inch needle was used.  Total: 4 mL (200 units)  # of injection sitesMuscleDose Total 1  Corrugator 0.1 mL (5 units) 1 Procerus 0.2 mL (10 units) 2 each sideFrontalis0.4 mL (20 units) 4 each sideTemporalis0.8 mL (40 units) 3 each sideOccipitalis0.6 mL (30 units) 2 each sideCervical paraspinals0.4 mL (20 units) 3 each sideTrapezius0.6 mL (30 units)  Total 31Totalused155 units Total wasted45 units   Pt tolerated procedure well without complications.  Reinjection is anticipated in 3 months.

## 2014-12-31 NOTE — Progress Notes (Signed)
See Botox procedure note.  Patient will follow up in office in 6 weeks.  Follow up for next round of Botox in 3 months.

## 2015-01-03 ENCOUNTER — Other Ambulatory Visit: Payer: Self-pay | Admitting: Family Medicine

## 2015-01-03 ENCOUNTER — Encounter: Payer: Self-pay | Admitting: Neurology

## 2015-01-05 ENCOUNTER — Telehealth: Payer: Self-pay

## 2015-01-05 MED ORDER — CYCLOBENZAPRINE HCL 7.5 MG PO TABS: 7.5000 mg | ORAL_TABLET | Freq: Three times a day (TID) | ORAL | Status: DC | PRN

## 2015-01-05 NOTE — Telephone Encounter (Signed)
Yes, we can refill her cyclobenzaprine.

## 2015-01-05 NOTE — Telephone Encounter (Signed)
Medication filled to pharmacy as requested.   

## 2015-01-05 NOTE — Telephone Encounter (Signed)
"  Per my last appointment he said that he would start covering it (cyclobenzaprine) for me. Dr. Beverely Lowabori will be leaving and said we needed to go through Dr. Everlena CooperJaffe moving forward. Please discuss with Dr. Everlena CooperJaffe per our appointment on November 9th. Thank you."   Please advise.

## 2015-01-17 ENCOUNTER — Other Ambulatory Visit: Payer: Self-pay | Admitting: Family Medicine

## 2015-01-19 ENCOUNTER — Ambulatory Visit (HOSPITAL_BASED_OUTPATIENT_CLINIC_OR_DEPARTMENT_OTHER)
Admission: RE | Admit: 2015-01-19 | Discharge: 2015-01-19 | Disposition: A | Discharge status: Home / Self Care | Payer: Medicare Other | Source: Ambulatory Visit | Attending: Family Medicine | Admitting: Family Medicine

## 2015-01-19 ENCOUNTER — Ambulatory Visit (INDEPENDENT_AMBULATORY_CARE_PROVIDER_SITE_OTHER): Payer: Medicare Other | Admitting: Family Medicine

## 2015-01-19 ENCOUNTER — Encounter: Payer: Self-pay | Admitting: Family Medicine

## 2015-01-19 VITALS — BP 120/72 | HR 126 | Temp 98.1°F | Resp 16 | Ht 63.0 in | Wt 177.0 lb

## 2015-01-19 DIAGNOSIS — M79672 Pain in left foot: Secondary | ICD-10-CM | POA: Insufficient documentation

## 2015-01-19 DIAGNOSIS — M7989 Other specified soft tissue disorders: Secondary | ICD-10-CM | POA: Diagnosis not present

## 2015-01-19 NOTE — Progress Notes (Signed)
Pre visit review using our clinic review tool, if applicable. No additional management support is needed unless otherwise documented below in the visit note. 

## 2015-01-19 NOTE — Progress Notes (Signed)
   Subjective:    Patient ID: Courtney Garza, female    DOB: 06/24/1970, 44 y.o.   MRN: [id number]  HPI L foot pain/swelling- pt noted bruising and swelling on dorsum of L foot on Wednesday.  Pt reports dropping blow dryer on foot 3 weeks ago.  Pt does have 2 dogs that routinely step on her feet- 1 being 60+ lbs.  No recollection of injury.  + TTP   Review of Systems For ROS see HPI     Objective:   Physical Exam  Constitutional: She is oriented to person, place, and time. She appears well-developed and well-nourished.  Cardiovascular: Intact distal pulses.   Musculoskeletal: She exhibits tenderness (TTP over dorsum of L foot w/ obvious bruising.  most tender over 2nd/3rd metatarsal). She exhibits no edema.  Neurological: She is alert and oriented to person, place, and time.  Skin: Skin is warm and dry. No erythema.  Vitals reviewed.         Assessment & Plan:

## 2015-01-19 NOTE — Telephone Encounter (Signed)
Medication filled to pharmacy as requested.   

## 2015-01-19 NOTE — Assessment & Plan Note (Signed)
New.  Pt has hx of dropping things on foot due to her spasticity on L side.  Due to this, will get xray to r/o fx.  Pt unable to ice due to neuropathic pain.  Encouraged supportive shoes and elevation.  Reviewed supportive care and red flags that should prompt return.  Pt expressed understanding and is in agreement w/ plan.

## 2015-01-19 NOTE — Patient Instructions (Signed)
Follow up as needed Go downstairs and get your xray- we'll notify you of the result Try and elevate your foot when sitting- this will improve the swelling Wear good supportive shoes when walking Call with any questions or concerns Hang in there!!! Happy Holidays!!!

## 2015-01-26 ENCOUNTER — Other Ambulatory Visit: Payer: Self-pay | Admitting: Neurology

## 2015-01-26 ENCOUNTER — Telehealth: Payer: Self-pay | Admitting: Behavioral Health

## 2015-01-26 NOTE — Telephone Encounter (Signed)
Unable to reach patient at time of Pre-Visit Call.  Left message for patient to return call when available.    

## 2015-01-27 ENCOUNTER — Ambulatory Visit (HOSPITAL_BASED_OUTPATIENT_CLINIC_OR_DEPARTMENT_OTHER)
Admission: RE | Admit: 2015-01-27 | Discharge: 2015-01-27 | Disposition: A | Discharge status: Home / Self Care | Payer: Medicare Other | Source: Ambulatory Visit | Attending: Family Medicine | Admitting: Family Medicine

## 2015-01-27 ENCOUNTER — Ambulatory Visit (INDEPENDENT_AMBULATORY_CARE_PROVIDER_SITE_OTHER): Payer: Medicare Other | Admitting: Physician Assistant

## 2015-01-27 ENCOUNTER — Telehealth: Payer: Self-pay | Admitting: Medical

## 2015-01-27 ENCOUNTER — Ambulatory Visit (HOSPITAL_BASED_OUTPATIENT_CLINIC_OR_DEPARTMENT_OTHER)
Admission: RE | Admit: 2015-01-27 | Discharge: 2015-01-27 | Disposition: A | Discharge status: Home / Self Care | Payer: Medicare Other | Source: Ambulatory Visit | Attending: Medical | Admitting: Medical

## 2015-01-27 ENCOUNTER — Encounter: Payer: Self-pay | Admitting: Physician Assistant

## 2015-01-27 ENCOUNTER — Ambulatory Visit (HOSPITAL_BASED_OUTPATIENT_CLINIC_OR_DEPARTMENT_OTHER)
Admission: RE | Admit: 2015-01-27 | Discharge: 2015-01-27 | Disposition: A | Discharge status: Home / Self Care | Payer: Medicare Other | Source: Ambulatory Visit | Attending: Physician Assistant | Admitting: Physician Assistant

## 2015-01-27 VITALS — BP 90/50 | HR 122 | Temp 97.9°F | Ht 63.0 in | Wt 178.0 lb

## 2015-01-27 DIAGNOSIS — R9431 Abnormal electrocardiogram [ECG] [EKG]: Secondary | ICD-10-CM | POA: Diagnosis not present

## 2015-01-27 DIAGNOSIS — M8580 Other specified disorders of bone density and structure, unspecified site: Secondary | ICD-10-CM | POA: Diagnosis present

## 2015-01-27 DIAGNOSIS — R0789 Other chest pain: Secondary | ICD-10-CM | POA: Insufficient documentation

## 2015-01-27 DIAGNOSIS — R000 Tachycardia, unspecified: Secondary | ICD-10-CM | POA: Diagnosis not present

## 2015-01-27 DIAGNOSIS — Z90710 Acquired absence of both cervix and uterus: Secondary | ICD-10-CM | POA: Diagnosis not present

## 2015-01-27 DIAGNOSIS — Z1231 Encounter for screening mammogram for malignant neoplasm of breast: Secondary | ICD-10-CM

## 2015-01-27 DIAGNOSIS — M85852 Other specified disorders of bone density and structure, left thigh: Secondary | ICD-10-CM | POA: Insufficient documentation

## 2015-01-27 DIAGNOSIS — R0600 Dyspnea, unspecified: Secondary | ICD-10-CM

## 2015-01-27 MED ORDER — FLUTICASONE-SALMETEROL 115-21 MCG/ACT IN AERO: 2.0000 | INHALATION_SPRAY | Freq: Two times a day (BID) | RESPIRATORY_TRACT | Status: DC

## 2015-01-27 NOTE — Progress Notes (Signed)
Pre visit review using our clinic review tool, if applicable. No additional management support is needed unless otherwise documented below in the visit note. 

## 2015-01-27 NOTE — Patient Instructions (Signed)
Please go to the lab for blood work. Then go downstairs for imaging. Continue Albuterol but begin Advair as directed. Stay hydrated and rest.  We will alter regimen based on results.  You will be contacted by Electrophysiology for further assessment of your chronic tachycardia and EKG findings.

## 2015-01-27 NOTE — Progress Notes (Signed)
Patient with history of asthma presents to clinic today c/o 2 days of chest tightness of mid chest with mild tenderness. Denies palpitations but has history of chronic tachycardia without prior workup. Has been feeling SOB and lightheaded only with significant exertion. Denies this at rest. Denies cough, congestion or URI symptoms. Denies recent change to medication or diet. Denies hx of anemia or GI bleed. Denies rectal bleeding.   Past Medical History  Diagnosis Date  . Migraine   . High cholesterol   . Depression   . Anxiety   . Dysarthria     Left arm  . History of stroke     a. 04/2014 MRI Brain: mod large chronic right MCA territory, chronic right cervical ICA dissection, smooth irregularity of the distal L cervical ICA consistent w/ underlying FMD, occlusion of multiple R MCA branch vessels in area of chronic infarct.   . Asthma   . Fibromuscular dysplasia (HCC)   . Left hemiparesis (HCC)   . Chest pain     a. 10/2014 CTA Chest: no dissection.    Current Outpatient Prescriptions on File Prior to Visit  Medication Sig Dispense Refill  . albuterol (PROAIR HFA) 108 (90 BASE) MCG/ACT inhaler Inhale 2 puffs into the lungs every 6 (six) hours as needed for wheezing or shortness of breath. 1 Inhaler 12  . amitriptyline (ELAVIL) 25 MG tablet TAKE 1 TABLET(25 MG) BY MOUTH AT BEDTIME 30 tablet 3  . Ascorbic Acid (VITAMIN C) 1000 MG tablet Take 1,000 mg by mouth daily.    Marland Kitchen aspirin 325 MG tablet Take 325 mg by mouth daily.    Marland Kitchen atorvastatin (LIPITOR) 20 MG tablet TAKE 1 TABLET BY MOUTH EVERY DAY 30 tablet 3  . cetirizine (ZYRTEC) 10 MG tablet Take 1 tablet (10 mg total) by mouth daily. 30 tablet 11  . cyclobenzaprine (FEXMID) 7.5 MG tablet Take 1 tablet (7.5 mg total) by mouth 3 (three) times daily as needed for muscle spasms. 90 tablet 0  . cyproheptadine (PERIACTIN) 4 MG tablet Take 1 tablet (4 mg total) by mouth 3 (three) times daily as needed. (Patient taking differently: Take 4 mg by  mouth 3 (three) times daily as needed (breakthrough headaches). ) 30 tablet 0  . Diclofenac Potassium (CAMBIA) 50 MG PACK Take 1package at earliest onset of headache x1. 1 each 2  . gabapentin (NEURONTIN) 300 MG capsule Take 1 capsule (300 mg total) by mouth 2 (two) times daily. 60 capsule 6  . gabapentin (NEURONTIN) 600 MG tablet TAKE 1 TABLET BY MOUTH EVERY NIGHT AT BEDTIME 30 tablet 6  . methscopolamine (PAMINE) 2.5 MG TABS tablet Take 1 tablet (2.5 mg total) by mouth 2 (two) times daily. 60 tablet 0  . omeprazole (PRILOSEC) 20 MG capsule TAKE 1 CAPSULE(20 MG) BY MOUTH TWICE DAILY BEFORE A MEAL 60 capsule 6  . promethazine (PHENERGAN) 12.5 MG suppository Place 1 suppository (12.5 mg total) rectally every 6 (six) hours as needed for nausea or vomiting. 12 each 0  . promethazine (PHENERGAN) 25 MG tablet TAKE 1 TABLET BY MOUTH EVERY 6 HOURS AS NEEDED FOR NAUSEA AND VOMITING 30 tablet 3  . venlafaxine XR (EFFEXOR-XR) 150 MG 24 hr capsule TAKE 1 CAPSULE(150 MG) BY MOUTH DAILY WITH BREAKFAST 30 capsule 3   No current facility-administered medications on file prior to visit.    Allergies  Allergen Reactions  . Acetaminophen Hives  . Fish Allergy     Hives and migraines  . Hydromorphone Other (See  Comments)    Could not speak or keep her eyes open  . Migraine Formula  [Aspirin-Acetaminophen-Caffeine] Hives    pt is able to eat shellfish without a problem.   . Ondansetron Nausea And Vomiting  . Tylenol [Acetaminophen] Hives  . Duloxetine Hcl Rash    hallucinations    Family History  Problem Relation Age of Onset  . Cancer Mother     Social History   Social History  . Marital Status: Married    Spouse Name: Tammy SoursGreg   . Number of Children: 3  . Years of Education: 10 th   Occupational History  .      Disabled   Social History Main Topics  . Smoking status: Former Smoker    Quit date: 02/22/2011  . Smokeless tobacco: Never Used     Comment: Quit 2013  . Alcohol Use: No  . Drug  Use: Yes    Special: Marijuana  . Sexual Activity: Not Asked   Other Topics Concern  . None   Social History Narrative   Patient lives at home with her husband Earl LitesGregory.   Disabled.   Education 10 th   Right handed.   Caffeine six cup of coffee daily.     Review of Systems - See HPI.  All other ROS are negative.  BP 90/50 mmHg  Pulse 122  Temp(Src) 97.9 F (36.6 C) (Oral)  Ht 5\' 3"  (1.6 m)  Wt 178 lb (80.74 kg)  BMI 31.54 kg/m2  SpO2 97%  Physical Exam  Constitutional: She is oriented to person, place, and time and well-developed, well-nourished, and in no distress.  HENT:  Head: Normocephalic and atraumatic.  Right Ear: External ear normal.  Left Ear: External ear normal.  Nose: Nose normal.  Mouth/Throat: Oropharynx is clear and moist. No oropharyngeal exudate.  TM within normal limits bilaterally.  Eyes: Conjunctivae are normal. Pupils are equal, round, and reactive to light.  Neck: Neck supple.  Cardiovascular: Regular rhythm, normal heart sounds and intact distal pulses.   Tachycardia noted  Pulmonary/Chest: Effort normal and breath sounds normal. No respiratory distress. She has no rales. She exhibits no tenderness.  Faint wheeze noted  Neurological: She is alert and oriented to person, place, and time.  Skin: Skin is warm and dry. No rash noted.  Psychiatric: Affect normal.  Vitals reviewed.   No results found for this or any previous visit (from the past 2160 hour(s)).  Assessment/Plan: Chest pressure With mild wheeze and chest tightness noted. EKG reveals sinus tachycardia with short PR interval. No acute changes consistent with emergent condition. Will start Advair. Continue chronic medications. Will check CXR, BMP, BNP, CBC w diff (due to tachycardia and low BP). Supportive measures reviewed. Will alter regimen based on results. Referral to EP made giving chronic tachycardia and shortened PR interval on EKG.  Tachycardia  EKG reveals sinus tachycardia  with short PR interval. No acute changes consistent with emergent condition. Will check CXR, BMP, BNP, CBC w diff (due to tachycardia and low BP). Supportive measures reviewed. Will alter regimen based on results. Referral to EP made giving chronic tachycardia and shortened PR interval on EKG.

## 2015-01-27 NOTE — Telephone Encounter (Signed)
Let me know if you got message regarding her dexascan. I got knocked off computer mid way. Computer problems at home so let me know you got the message and called pt.

## 2015-01-28 ENCOUNTER — Encounter: Payer: Self-pay | Admitting: Physician Assistant

## 2015-01-28 DIAGNOSIS — R000 Tachycardia, unspecified: Secondary | ICD-10-CM | POA: Insufficient documentation

## 2015-01-28 DIAGNOSIS — R0789 Other chest pain: Secondary | ICD-10-CM | POA: Insufficient documentation

## 2015-01-28 LAB — CBC WITH DIFFERENTIAL/PLATELET
Basophils Absolute: 0.1 10*3/uL (ref 0.0–0.1)
Basophils Relative: 0.9 % (ref 0.0–3.0)
Eosinophils Absolute: 0.2 10*3/uL (ref 0.0–0.7)
Eosinophils Relative: 2.9 % (ref 0.0–5.0)
HCT: 43.9 % (ref 36.0–46.0)
Hemoglobin: 14.1 g/dL (ref 12.0–15.0)
Lymphocytes Relative: 41.7 % (ref 12.0–46.0)
Lymphs Abs: 2.9 10*3/uL (ref 0.7–4.0)
MCHC: 32.1 g/dL (ref 30.0–36.0)
MCV: 93.7 fl (ref 78.0–100.0)
Monocytes Absolute: 0.4 10*3/uL (ref 0.1–1.0)
Monocytes Relative: 5.1 % (ref 3.0–12.0)
Neutro Abs: 3.5 10*3/uL (ref 1.4–7.7)
Neutrophils Relative %: 49.4 % (ref 43.0–77.0)
Platelets: 269 10*3/uL (ref 150.0–400.0)
RBC: 4.68 Mil/uL (ref 3.87–5.11)
RDW: 13.6 % (ref 11.5–15.5)
WBC: 7.1 10*3/uL (ref 4.0–10.5)

## 2015-01-28 LAB — BASIC METABOLIC PANEL
BUN: 3 mg/dL — ABNORMAL LOW (ref 6–23)
CO2: 25 mEq/L (ref 19–32)
Calcium: 9.5 mg/dL (ref 8.4–10.5)
Chloride: 104 mEq/L (ref 96–112)
Creatinine, Ser: 0.74 mg/dL (ref 0.40–1.20)
GFR: 90.16 mL/min (ref >60.00)
Glucose, Bld: 68 mg/dL — ABNORMAL LOW (ref 70–99)
Potassium: 4.2 mEq/L (ref 3.5–5.1)
Sodium: 142 mEq/L (ref 135–145)

## 2015-01-28 LAB — BRAIN NATRIURETIC PEPTIDE: Pro B Natriuretic peptide (BNP): 7 pg/mL (ref 0.0–100.0)

## 2015-01-28 NOTE — Assessment & Plan Note (Signed)
With mild wheeze and chest tightness noted. EKG reveals sinus tachycardia with short PR interval. No acute changes consistent with emergent condition. Will start Advair. Continue chronic medications. Will check CXR, BMP, BNP, CBC w diff (due to tachycardia and low BP). Supportive measures reviewed. Will alter regimen based on results. Referral to EP made giving chronic tachycardia and shortened PR interval on EKG.

## 2015-01-28 NOTE — Assessment & Plan Note (Signed)
EKG reveals sinus tachycardia with short PR interval. No acute changes consistent with emergent condition. Will check CXR, BMP, BNP, CBC w diff (due to tachycardia and low BP). Supportive measures reviewed. Will alter regimen based on results. Referral to EP made giving chronic tachycardia and shortened PR interval on EKG.

## 2015-02-02 ENCOUNTER — Encounter: Payer: Self-pay | Admitting: Neurology

## 2015-02-02 ENCOUNTER — Encounter: Payer: Self-pay | Admitting: Physician Assistant

## 2015-02-02 ENCOUNTER — Ambulatory Visit (INDEPENDENT_AMBULATORY_CARE_PROVIDER_SITE_OTHER): Payer: Medicare Other | Admitting: Neurology

## 2015-02-02 VITALS — BP 114/80 | HR 121 | Ht 61.0 in | Wt 178.0 lb

## 2015-02-02 DIAGNOSIS — G8114 Spastic hemiplegia affecting left nondominant side: Secondary | ICD-10-CM

## 2015-02-02 DIAGNOSIS — I7771 Dissection of carotid artery: Secondary | ICD-10-CM

## 2015-02-02 DIAGNOSIS — I773 Arterial fibromuscular dysplasia: Secondary | ICD-10-CM | POA: Diagnosis not present

## 2015-02-02 DIAGNOSIS — G43709 Chronic migraine without aura, not intractable, without status migrainosus: Secondary | ICD-10-CM

## 2015-02-02 NOTE — Patient Instructions (Addendum)
Consider seeing podiatrist regarding curling of toes Follow up for next round of Botox

## 2015-02-02 NOTE — Progress Notes (Signed)
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  She is accompanied by her husband who provides some history.  UPDATE: Last round of Botox for migraine and spastic hemiparesis was on 12/31/14.  Migraines are a little more frequent now, however not severe.  She especially feels the pain in the right frontal region. She has difficulty moving left intrinsic hand muscles but they are not spastic and not painful Over the past 2 months, she notes that the toes in her left foot spontaneously curl.  She is able to consciously straighten them but they hurt.  Current abortive therapy:  Cyproheptadine, Cambia Current preventative therapy:  Amitriptyline  (depression), venlafaxine ER  (depression), gabapentin (neuralgia) Other medications: cyclobenzaprine 7.5mg  three times daily  She reports a new problem. About 2 months ago, she developed a deep pain in her right ear.  Her hearing is slightly muffled.  She has tinnitus but that is chronic.  No fevers.  About a couple of weeks later, she developed dizziness, described as both lightheadedness and spinning.  She was prescribed antibiotics, nasal spray and steroids, which were ineffective.  The dizziness is constant but worse when she gets up. She fell on a couple of occasions due to this.  She has longstanding history of palpitations.  Her HR is in the 130s today.  HISTORY: She moved to [place] from [place] over the summer.  She was being followed at the [facility], where she was receiving Botox for her migraines and spasticity.  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  The persistent headaches were attributed to the SDH.  She told me last visit she received Botox, which helped the migraines.  Her last round of Botox was in May.  However, reviewing the notes at [facility], she seemed to have been receiving occipital nerve blocks but there is no mention of Botox for her migraines.  She reportedly was getting Botox for her left sided spasticity. Onset:  All her life, however worse since her stroke in 2012 Location:  Right occipital spreading to frontal region Quality:  aching, burning? Initial Intensity:  7-8/10 Aura:  Occasionally "disturbed vision" (cannot focus) Prodrome:  no Associated symptoms:  Nausea, vomiting, photophobia, phonophobia, osmophobia, runny nose Initial Duration:  constant Initial Frequency:  constant Triggers/exacerbating factors:  fish Relieving factors:  none Activity:  Can't function 20 days out of the month.  Past abortive therapy:  Advil/Aleve ineffective. Past preventative therapy:  topiramate (vomiting), Cymbalta ([facility]), Depakote (was only on it briefly, started in hospital but quickly changed as outpatient), Botox (effective), right greater occipital nerve block (effective for 6 weeks) Other past medications:  citalopram, promethazine  Contraindications:  Acetaminophen/Excedrin (hives), triptans (FMD)   Caffeine:  6 cups coffee daily Alcohol:  no Smoker:  former Diet:  healthy Exercise:  Walks daily Depression/stress:  stable Sleep hygiene:  good Family history of headache:  Dad  She had a right hemispheric stroke  related to a carotid dissection due to fibromuscular dysplasia in 2012.  She has residual left spastic hemiparesis.  She was bedridden for two months  and developed a DVT, requiring an IVC filter.  She reportedly was found to have protein C and S deficiency.  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  She has established care with Dr. Shawnee Knapp at [facility] for her FMD.  She takes ASA 325mg  daily.  PAST MEDICAL HISTORY: Past Medical History  Diagnosis Date  . Migraine   . High cholesterol   . Depression   . Anxiety   . Dysarthria     Left arm  . History of stroke     a. 04/2014 MRI Brain: mod large chronic right MCA territory, chronic right cervical ICA dissection, smooth irregularity of the distal L cervical ICA consistent w/ underlying FMD, occlusion of multiple R MCA branch vessels in area of chronic infarct.   . Asthma   . Fibromuscular dysplasia (HCC)   . Left hemiparesis (HCC)   . Chest pain     a. 10/2014 CTA Chest: no dissection.    MEDICATIONS: Current Outpatient Prescriptions on File Prior to Visit  Medication Sig Dispense Refill  . albuterol (PROAIR HFA) 108 (90 BASE) MCG/ACT inhaler Inhale 2 puffs into the lungs every 6 (six) hours as needed for wheezing or shortness of breath. 1 Inhaler 12  . amitriptyline (ELAVIL) 25 MG tablet TAKE 1 TABLET(25 MG) BY MOUTH AT BEDTIME 30 tablet 3  . Ascorbic Acid (VITAMIN C) 1000 MG tablet Take 1,000 mg by mouth daily.    Marland Kitchen aspirin 325 MG tablet Take 325 mg by mouth daily.    Marland Kitchen atorvastatin (LIPITOR) 20 MG tablet TAKE 1 TABLET BY MOUTH EVERY DAY 30 tablet 3  . cetirizine (ZYRTEC) 10 MG tablet Take 1 tablet (10 mg total) by mouth daily. 30 tablet 11  . cyclobenzaprine (FEXMID) 7.5 MG tablet Take 1 tablet (7.5 mg total) by mouth 3 (three) times daily as needed for muscle spasms. 90 tablet 0  . cyproheptadine (PERIACTIN) 4 MG tablet Take 1 tablet (4 mg total) by mouth 3 (three) times daily as needed. (Patient taking differently: Take 4 mg by  mouth 3 (three) times daily as needed (breakthrough headaches). ) 30 tablet 0  . Diclofenac Potassium (CAMBIA) 50 MG PACK Take 1package at earliest onset of headache x1. 1 each 2  . fluticasone-salmeterol (ADVAIR HFA) 115-21 MCG/ACT inhaler Inhale 2 puffs into the lungs 2 (two) times daily. 1 Inhaler 12  . gabapentin (NEURONTIN) 300 MG capsule Take 1 capsule (300 mg total) by mouth 2 (two) times daily. 60 capsule 6  . gabapentin (NEURONTIN) 600 MG tablet TAKE 1 TABLET BY MOUTH EVERY NIGHT AT BEDTIME 30 tablet 6  . methscopolamine (PAMINE) 2.5 MG TABS tablet Take 1 tablet (2.5 mg total) by mouth 2 (two) times daily. 60 tablet 0  . omeprazole (PRILOSEC) 20 MG capsule TAKE 1 CAPSULE(20 MG) BY MOUTH TWICE DAILY BEFORE A MEAL 60 capsule 6  . promethazine (PHENERGAN) 12.5 MG suppository Place 1 suppository (12.5 mg total) rectally every 6 (six) hours as needed for nausea or vomiting. 12 each 0  . promethazine (PHENERGAN) 25 MG tablet TAKE 1 TABLET BY MOUTH EVERY 6 HOURS AS NEEDED FOR NAUSEA AND VOMITING 30 tablet 3  . venlafaxine XR (EFFEXOR-XR) 150 MG 24  hr capsule TAKE 1 CAPSULE(150 MG) BY MOUTH DAILY WITH BREAKFAST 30 capsule 3   No current facility-administered medications on file prior to visit.    ALLERGIES: Allergies  Allergen Reactions  . Acetaminophen Hives  . Fish Allergy     Hives and migraines  . Hydromorphone Other (See Comments)    Could not speak or keep her eyes open  . Migraine Formula  [Aspirin-Acetaminophen-Caffeine] Hives    pt is able to eat shellfish without a problem.   . Ondansetron Nausea And Vomiting  . Tylenol [Acetaminophen] Hives  . Duloxetine Hcl Rash    hallucinations    FAMILY HISTORY: Family History  Problem Relation Age of Onset  . Cancer Mother     SOCIAL HISTORY: Social History   Social History  . Marital Status: Married    Spouse Name: [name]   . Number of Children: 3  . Years of Education: 10 th   Occupational History  .      Disabled    Social History Main Topics  . Smoking status: Former Smoker    Quit date: 02/22/2011  . Smokeless tobacco: Never Used     Comment: Quit 2013  . Alcohol Use: No  . Drug Use: Yes    Special: Marijuana  . Sexual Activity: Not on file   Other Topics Concern  . Not on file   Social History Narrative   Patient lives at home with her husband [name].   Disabled.   Education 10 th   Right handed.   Caffeine six cup of coffee daily.      REVIEW OF SYSTEMS: Constitutional: No fevers, chills, or sweats, no generalized fatigue, change in appetite Eyes: No visual changes, double vision, eye pain Ear, nose and throat: No hearing loss, ear pain, nasal congestion, sore throat Cardiovascular: No chest pain, palpitations Respiratory:  No shortness of breath at rest or with exertion, wheezes GastrointestinaI: No nausea, vomiting, diarrhea, abdominal pain, fecal incontinence Genitourinary:  No dysuria, urinary retention or frequency Musculoskeletal:  No neck pain, back pain Integumentary: No rash, pruritus, skin lesions Neurological: as above Psychiatric: No depression, insomnia, anxiety Endocrine: No palpitations, fatigue, diaphoresis, mood swings, change in appetite, change in weight, increased thirst Hematologic/Lymphatic:  No anemia, purpura, petechiae. Allergic/Immunologic: no itchy/runny eyes, nasal congestion, recent allergic reactions, rashes  PHYSICAL EXAM: Filed Vitals:   02/02/15 1605  BP: 114/80  Pulse: 121   General: No acute distress.  Patient appears well-groomed.  Head:  Normocephalic/atraumatic Eyes:  Fundoscopic exam unremarkable without vessel changes, exudates, hemorrhages or papilledema. Neck: supple, no paraspinal tenderness, full range of motion Heart:  Regular rate and rhythm Lungs:  Clear to auscultation bilaterally Back: No paraspinal tenderness Neurological Exam: alert and oriented to person, place, and time. Attention span and concentration intact, recent  and remote memory intact, fund of knowledge intact.  Speech fluent and not dysarthric, language intact.  Right pupil 1mm larger than left pupil and sluggishly reactive.  Left lower facial weakness.  Bilateral peripheral vision loss.  Left shoulder shrug weakness.  Otherwise, CN II-XII intact. Fundoscopic exam unremarkable without vessel changes, exudates, hemorrhages or papilledema.  Left upper extremity spasticity, left lower extremity increased tone.  Left upper extremity plegia, left quad 5-/5, 2/5 left foot.  Hyperesthesia to light touch in left hand.  Deep tendon reflexes 3+ on left, 2+ on right.  Finger to nose  testing intact on right (unable to test left).  Gait with left circumduction.  IMPRESSION: Left spastic hemiparesis as  late effect of stroke CVA secondary to right carotid dissection Chronic migraine Fibromuscular dysplasia  PLAN: ASA daily Follow up for next round of Botox.  Consider increasing dose of in the right frontalis a little Consider seeing podiatrist for toe curling Cyclobenzaprine 7.5mg   15 minutes spent face to face with patient, over 50% spent discussing management.   CC:  Waldon MerlWilliam C. Martin, PA-C

## 2015-02-19 ENCOUNTER — Other Ambulatory Visit: Payer: Self-pay | Admitting: Family Medicine

## 2015-02-25 ENCOUNTER — Ambulatory Visit (INDEPENDENT_AMBULATORY_CARE_PROVIDER_SITE_OTHER): Payer: Medicare Other | Admitting: Physician Assistant

## 2015-02-25 ENCOUNTER — Encounter: Payer: Self-pay | Admitting: Physician Assistant

## 2015-02-25 VITALS — BP 100/60 | HR 60 | Temp 98.1°F | Ht 61.0 in | Wt 181.8 lb

## 2015-02-25 DIAGNOSIS — M8580 Other specified disorders of bone density and structure, unspecified site: Secondary | ICD-10-CM | POA: Diagnosis not present

## 2015-02-25 NOTE — Progress Notes (Signed)
Pre visit review using our clinic review tool, if applicable. No additional management support is needed unless otherwise documented below in the visit note. 

## 2015-02-25 NOTE — Assessment & Plan Note (Signed)
Will check Vitamin D level, PTH and calcium level. If normal we will begin 800 units Vitamin D daily and 1200 of Calcium daily. Resistance training reviewed. If labs reveal low D or abnormal PTH we will treat accordingly.

## 2015-02-25 NOTE — Progress Notes (Signed)
Patient presents to clinic today to review bone density results ordered by another providers. Bone Density scan obtained on 01/27/15 after sustaining a rib fracture for unknown cause. No other labs have been checked up to this point. Bone Denity scan revealed osteopenia with low FRAX score. Vitamin D and calcium supplementation recommended. Patient wished to discuss with her PCP before starting a regimen.  Past Medical History  Diagnosis Date  . Migraine   . High cholesterol   . Depression   . Anxiety   . Dysarthria     Left arm  . History of stroke     a. 04/2014 MRI Brain: mod large chronic right MCA territory, chronic right cervical ICA dissection, smooth irregularity of the distal L cervical ICA consistent w/ underlying FMD, occlusion of multiple R MCA branch vessels in area of chronic infarct.   . Asthma   . Fibromuscular dysplasia (HCC)   . Left hemiparesis (HCC)   . Chest pain     a. 10/2014 CTA Chest: no dissection.    Current Outpatient Prescriptions on File Prior to Visit  Medication Sig Dispense Refill  . albuterol (PROAIR HFA) 108 (90 BASE) MCG/ACT inhaler Inhale 2 puffs into the lungs every 6 (six) hours as needed for wheezing or shortness of breath. 1 Inhaler 12  . amitriptyline (ELAVIL) 25 MG tablet TAKE 1 TABLET(25 MG) BY MOUTH AT BEDTIME 30 tablet 3  . Ascorbic Acid (VITAMIN C) 1000 MG tablet Take 1,000 mg by mouth daily.    Marland Kitchen aspirin 325 MG tablet Take 325 mg by mouth daily.    Marland Kitchen atorvastatin (LIPITOR) 20 MG tablet TAKE 1 TABLET BY MOUTH EVERY DAY 30 tablet 3  . cetirizine (ZYRTEC) 10 MG tablet Take 1 tablet (10 mg total) by mouth daily. 30 tablet 11  . cyclobenzaprine (FEXMID) 7.5 MG tablet Take 1 tablet (7.5 mg total) by mouth 3 (three) times daily as needed for muscle spasms. 90 tablet 0  . cyproheptadine (PERIACTIN) 4 MG tablet Take 1 tablet (4 mg total) by mouth 3 (three) times daily as needed. (Patient taking differently: Take 4 mg by mouth 3 (three) times daily  as needed (breakthrough headaches). ) 30 tablet 0  . Diclofenac Potassium (CAMBIA) 50 MG PACK Take 1package at earliest onset of headache x1. 1 each 2  . fluticasone-salmeterol (ADVAIR HFA) 115-21 MCG/ACT inhaler Inhale 2 puffs into the lungs 2 (two) times daily. 1 Inhaler 12  . gabapentin (NEURONTIN) 300 MG capsule Take 1 capsule (300 mg total) by mouth 2 (two) times daily. 60 capsule 6  . gabapentin (NEURONTIN) 600 MG tablet TAKE 1 TABLET BY MOUTH EVERY NIGHT AT BEDTIME 30 tablet 6  . methscopolamine (PAMINE) 2.5 MG TABS tablet Take 1 tablet (2.5 mg total) by mouth 2 (two) times daily. 60 tablet 0  . omeprazole (PRILOSEC) 20 MG capsule TAKE 1 CAPSULE(20 MG) BY MOUTH TWICE DAILY BEFORE A MEAL 60 capsule 6  . promethazine (PHENERGAN) 12.5 MG suppository Place 1 suppository (12.5 mg total) rectally every 6 (six) hours as needed for nausea or vomiting. 12 each 0  . promethazine (PHENERGAN) 25 MG tablet TAKE 1 TABLET BY MOUTH EVERY 6 HOURS AS NEEDED FOR NAUSEA AND VOMITING 30 tablet 3  . venlafaxine XR (EFFEXOR-XR) 150 MG 24 hr capsule TAKE 1 CAPSULE(150 MG) BY MOUTH DAILY WITH BREAKFAST 30 capsule 3   No current facility-administered medications on file prior to visit.    Allergies  Allergen Reactions  . Acetaminophen Hives  .  Fish Allergy     Hives and migraines  . Hydromorphone Other (See Comments)    Could not speak or keep her eyes open  . Migraine Formula  [Aspirin-Acetaminophen-Caffeine] Hives    pt is able to eat shellfish without a problem.   . Ondansetron Nausea And Vomiting  . Tylenol [Acetaminophen] Hives  . Duloxetine Hcl Rash    hallucinations    Family History  Problem Relation Age of Onset  . Cancer Mother   . Aortic aneurysm Father     Social History   Social History  . Marital Status: Married    Spouse Name: Tammy SoursGreg   . Number of Children: 3  . Years of Education: 10 th   Occupational History  .      Disabled   Social History Main Topics  . Smoking status:  Former Smoker    Quit date: 02/22/2011  . Smokeless tobacco: Never Used     Comment: Quit 2013  . Alcohol Use: No  . Drug Use: Yes    Special: Marijuana  . Sexual Activity: Not Asked   Other Topics Concern  . None   Social History Narrative   Patient lives at home with her husband Earl LitesGregory.   Disabled.   Education 10 th   Right handed.   Caffeine six cup of coffee daily.      Review of Systems - See HPI.  All other ROS are negative.  BP 100/60 mmHg  Pulse 60  Temp(Src) 98.1 F (36.7 C) (Oral)  Ht 5\' 1"  (1.549 m)  Wt 181 lb 12.8 oz (82.464 kg)  BMI 34.37 kg/m2  SpO2 98%  Physical Exam  Constitutional: She is oriented to person, place, and time and well-developed, well-nourished, and in no distress.  HENT:  Head: Normocephalic and atraumatic.  Eyes: Conjunctivae are normal.  Cardiovascular: Normal rate and regular rhythm.   Pulmonary/Chest: Effort normal.  Neurological: She is alert and oriented to person, place, and time.  Skin: Skin is warm and dry. No rash noted.  Psychiatric: Affect normal.  Vitals reviewed.   Recent Results (from the past 2160 hour(s))  CBC w/Diff     Status: None   Collection Time: 01/27/15  2:24 PM  Result Value Ref Range   WBC 7.1 4.0 - 10.5 K/uL   RBC 4.68 3.87 - 5.11 Mil/uL   Hemoglobin 14.1 12.0 - 15.0 g/dL   HCT 16.143.9 09.636.0 - 04.546.0 %   MCV 93.7 78.0 - 100.0 fl   MCHC 32.1 30.0 - 36.0 g/dL   RDW 40.913.6 81.111.5 - 91.415.5 %   Platelets 269.0 150.0 - 400.0 K/uL   Neutrophils Relative % 49.4 43.0 - 77.0 %   Lymphocytes Relative 41.7 12.0 - 46.0 %   Monocytes Relative 5.1 3.0 - 12.0 %   Eosinophils Relative 2.9 0.0 - 5.0 %   Basophils Relative 0.9 0.0 - 3.0 %   Neutro Abs 3.5 1.4 - 7.7 K/uL   Lymphs Abs 2.9 0.7 - 4.0 K/uL   Monocytes Absolute 0.4 0.1 - 1.0 K/uL   Eosinophils Absolute 0.2 0.0 - 0.7 K/uL   Basophils Absolute 0.1 0.0 - 0.1 K/uL  Basic Metabolic Panel (BMET)     Status: Abnormal   Collection Time: 01/27/15  2:24 PM  Result  Value Ref Range   Sodium 142 135 - 145 mEq/L   Potassium 4.2 3.5 - 5.1 mEq/L   Chloride 104 96 - 112 mEq/L   CO2 25 19 - 32 mEq/L  Glucose, Bld 68 (L) 70 - 99 mg/dL   BUN 3 (L) 6 - 23 mg/dL   Creatinine, Ser 1.19 0.40 - 1.20 mg/dL   Calcium 9.5 8.4 - 14.7 mg/dL   GFR 82.95 >62.13 mL/min  B Nat Peptide     Status: None   Collection Time: 01/27/15  2:24 PM  Result Value Ref Range   Pro B Natriuretic peptide (BNP) 7.0 0.0 - 100.0 pg/mL    Assessment/Plan: Osteopenia Will check Vitamin D level, PTH and calcium level. If normal we will begin 800 units Vitamin D daily and 1200 of Calcium daily. Resistance training reviewed. If labs reveal low D or abnormal PTH we will treat accordingly.

## 2015-02-25 NOTE — Patient Instructions (Signed)
Please go to the lab for blood work. I will call you with your results and we can make final decision regarding calcium and vitamin D supplementation.   Please complete the paperwork given to you and bring it in to the office so we can help get financial assistance.

## 2015-02-26 ENCOUNTER — Encounter: Payer: Self-pay | Admitting: Internal Medicine

## 2015-02-26 ENCOUNTER — Ambulatory Visit (INDEPENDENT_AMBULATORY_CARE_PROVIDER_SITE_OTHER): Payer: Medicare Other | Admitting: Internal Medicine

## 2015-02-26 VITALS — BP 122/64 | HR 120 | Ht 61.0 in | Wt 181.0 lb

## 2015-02-26 DIAGNOSIS — I773 Arterial fibromuscular dysplasia: Secondary | ICD-10-CM

## 2015-02-26 DIAGNOSIS — R000 Tachycardia, unspecified: Secondary | ICD-10-CM

## 2015-02-26 LAB — PTH, INTACT AND CALCIUM
Calcium: 9.7 mg/dL (ref 8.4–10.5)
PTH: 90 pg/mL — ABNORMAL HIGH (ref 14–64)

## 2015-02-26 LAB — VITAMIN D 25 HYDROXY (VIT D DEFICIENCY, FRACTURES): VITD: 23.41 ng/mL — ABNORMAL LOW (ref 30.00–100.00)

## 2015-02-26 NOTE — Patient Instructions (Signed)
Medication Instructions:  Your physician recommends that you continue on your current medications as directed. Please refer to the Current Medication list given to you today.  Labwork: None ordered.  Testing/Procedures: None ordered.  Follow-Up: Your physician recommends that you schedule a follow-up appointment as needed.   Any Other Special Instructions Will Be Listed Below (If Applicable).     If you need a refill on your cardiac medications before your next appointment, please call your pharmacy.   

## 2015-02-26 NOTE — Progress Notes (Signed)
HPI [name] is referred today for evaluation of sinus tachycardia and a short PR interval. 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 The has never had syncope and has never had documented SVT. She was told that she has a murmur by her primary MD. She denies chest pain or sob. She admits to being sedentary in part due to her stroke. She does have a family history of arterial dissection.  Allergies  Allergen Reactions  . Acetaminophen Hives  . Fish Allergy     Hives and migraines  . Hydromorphone Other (See Comments)    Could not speak or keep her eyes open  . Migraine Formula  [Aspirin-Acetaminophen-Caffeine] Hives    pt is able to eat shellfish without a problem.   . Ondansetron Nausea And Vomiting  . Tylenol [Acetaminophen] Hives  . Duloxetine Hcl Rash    hallucinations     Current Outpatient Prescriptions  Medication Sig Dispense Refill  . albuterol (PROAIR HFA) 108 (90 BASE) MCG/ACT inhaler Inhale 2 puffs into the lungs every 6 (six) hours as needed for wheezing or shortness of breath. 1 Inhaler 12  . amitriptyline (ELAVIL) 25 MG tablet TAKE 1 TABLET(25 MG) BY MOUTH AT BEDTIME 30 tablet 3  . Ascorbic Acid (VITAMIN C) 1000 MG tablet Take 1,000 mg by mouth daily.    Marland Kitchen. aspirin 325 MG tablet Take 325 mg by mouth daily.    Marland Kitchen. atorvastatin (LIPITOR) 20 MG tablet TAKE 1 TABLET BY MOUTH EVERY DAY 30 tablet 3  . cetirizine (ZYRTEC) 10 MG tablet Take 1 tablet (10 mg total) by mouth daily. 30 tablet 11  . cyclobenzaprine (FEXMID) 7.5 MG tablet Take 1 tablet (7.5 mg total) by mouth 3 (three) times daily as needed for muscle spasms. 90 tablet 0  . cyproheptadine (PERIACTIN) 4 MG tablet Take 1 tablet (4 mg total) by mouth 3 (three) times daily as needed. (Patient taking differently: Take 4 mg by mouth 3 (three) times daily as needed (breakthrough headaches). ) 30 tablet 0    . Diclofenac Potassium (CAMBIA) 50 MG PACK Take 1package at earliest onset of headache x1. 1 each 2  . fluticasone-salmeterol (ADVAIR HFA) 115-21 MCG/ACT inhaler Inhale 2 puffs into the lungs 2 (two) times daily. 1 Inhaler 12  . gabapentin (NEURONTIN) 300 MG capsule Take 1 capsule (300 mg total) by mouth 2 (two) times daily. 60 capsule 6  . gabapentin (NEURONTIN) 600 MG tablet TAKE 1 TABLET BY MOUTH EVERY NIGHT AT BEDTIME 30 tablet 6  . methscopolamine (PAMINE) 2.5 MG TABS tablet Take 1 tablet (2.5 mg total) by mouth 2 (two) times daily. 60 tablet 0  . omeprazole (PRILOSEC) 20 MG capsule TAKE 1 CAPSULE(20 MG) BY MOUTH TWICE DAILY BEFORE A MEAL 60 capsule 6  . promethazine (PHENERGAN) 12.5 MG suppository Place 1 suppository (12.5 mg total) rectally every 6 (six) hours as needed for nausea or vomiting. 12 each 0  . promethazine (PHENERGAN) 25 MG tablet TAKE 1 TABLET BY MOUTH EVERY 6 HOURS AS NEEDED FOR NAUSEA AND VOMITING 30 tablet 3  . venlafaxine XR (EFFEXOR-XR) 150 MG 24 hr capsule TAKE 1 CAPSULE(150 MG) BY MOUTH DAILY WITH BREAKFAST 30 capsule 3   No current facility-administered medications for this visit.     Past Medical History  Diagnosis Date  . Migraine   . High cholesterol   . Depression   . Anxiety   .  Dysarthria     Left arm  . History of stroke     a. 04/2014 MRI Brain: mod large chronic right MCA territory, chronic right cervical ICA dissection, smooth irregularity of the distal L cervical ICA consistent w/ underlying FMD, occlusion of multiple R MCA branch vessels in area of chronic infarct.   . Asthma   . Fibromuscular dysplasia (HCC)   . Left hemiparesis (HCC)   . Chest pain     a. 10/2014 CTA Chest: no dissection.    ROS:   All systems reviewed and negative except as noted in the HPI.   Past Surgical History  Procedure Laterality Date  . Vaginal hysterectomy    . Appendectomy    . Breast enhancement surgery    . Vena cava filter placement      and removal      Family History  Problem Relation Age of Onset  . Cancer Mother   . Aortic aneurysm Father      Social History   Social History  . Marital Status: Married    Spouse Name: [name]   . Number of Children: 3  . Years of Education: 10 th   Occupational History  .      Disabled   Social History Main Topics  . Smoking status: Former Smoker    Quit date: 02/22/2011  . Smokeless tobacco: Never Used     Comment: Quit 2013  . Alcohol Use: No  . Drug Use: Yes    Special: Marijuana  . Sexual Activity: Not on file   Other Topics Concern  . Not on file   Social History Narrative   Patient lives at home with her husband [name].   Disabled.   Education 10 th   Right handed.   Caffeine six cup of coffee daily.       BP 122/64 mmHg  Pulse 120  Ht 5\' 1"  (1.549 m)  Wt 181 lb (82.101 kg)  BMI 34.22 kg/m2  Physical Exam:  stable appearing obese woman, NAD HEENT: Unremarkable Neck:  6 cm JVD, no thyromegally Lymphatics:  No adenopathy Back:  No CVA tenderness Lungs:  Clear with no wheezes HEART:  Regular tachy rhythm, no murmurs, no rubs, no clicks Abd:  soft, positive bowel sounds, no organomegally, no rebound, no guarding Ext:  2 plus pulses, no edema, no cyanosis, no clubbing Skin:  No rashes no nodules Neuro:  CN II through XII intact, motor grossly intact  EKG - sinus tachycardia   Assess/Plan:

## 2015-02-26 NOTE — Assessment & Plan Note (Signed)
The patient has asymptomatic sinus tachycardia of uncertain clinical significance. As she is asymptomatic, I have recommended she attempt to increase her physical activity and avoid caffeine. No indication for beta blocker or calcium channel blocker at this time. She does have a short PR interval but no suggestion of WPW syndrome. No indication to work this up further.

## 2015-02-26 NOTE — Assessment & Plan Note (Signed)
She has had multiple arterial dissections. The etiology is unclear. I'll defer workup to her medical MD.

## 2015-02-27 NOTE — Addendum Note (Signed)
Addended by: Reesa ChewJONES, Estephany Perot G on: 02/27/2015 04:49 PM   Modules accepted: Orders

## 2015-03-02 ENCOUNTER — Encounter: Payer: Self-pay | Admitting: Physician Assistant

## 2015-03-02 ENCOUNTER — Other Ambulatory Visit: Payer: Self-pay | Admitting: Physician Assistant

## 2015-03-02 MED ORDER — VITAMIN D (ERGOCALCIFEROL) 1.25 MG (50000 UNIT) PO CAPS: 50000.0000 [IU] | ORAL_CAPSULE | ORAL | Status: DC

## 2015-03-03 ENCOUNTER — Other Ambulatory Visit: Payer: Self-pay

## 2015-03-03 MED ORDER — CYPROHEPTADINE HCL 4 MG PO TABS: 4.0000 mg | ORAL_TABLET | Freq: Three times a day (TID) | ORAL | Status: DC | PRN

## 2015-03-03 NOTE — Telephone Encounter (Signed)
Rx denied-Has already been filled.//AB/CMA

## 2015-03-07 ENCOUNTER — Other Ambulatory Visit: Payer: Self-pay | Admitting: Family Medicine

## 2015-03-09 NOTE — Telephone Encounter (Deleted)
Last OV: 02/25/2015 Last filled:

## 2015-03-11 ENCOUNTER — Other Ambulatory Visit: Payer: Self-pay | Admitting: Neurology

## 2015-03-20 ENCOUNTER — Encounter: Payer: Self-pay | Admitting: Physician Assistant

## 2015-03-21 ENCOUNTER — Other Ambulatory Visit: Payer: Self-pay | Admitting: Family Medicine

## 2015-03-30 ENCOUNTER — Ambulatory Visit (INDEPENDENT_AMBULATORY_CARE_PROVIDER_SITE_OTHER): Payer: Medicare Other | Admitting: Neurology

## 2015-03-30 DIAGNOSIS — G43009 Migraine without aura, not intractable, without status migrainosus: Secondary | ICD-10-CM | POA: Diagnosis not present

## 2015-03-30 DIAGNOSIS — G8114 Spastic hemiplegia affecting left nondominant side: Secondary | ICD-10-CM

## 2015-03-30 DIAGNOSIS — G43709 Chronic migraine without aura, not intractable, without status migrainosus: Secondary | ICD-10-CM

## 2015-03-30 MED ORDER — ONABOTULINUMTOXINA 100 UNITS IJ SOLR
200.0000 [IU] | Freq: Once | INTRAMUSCULAR | Status: AC
  Administered 2015-03-30: 200 [IU] via INTRAMUSCULAR

## 2015-03-30 NOTE — Progress Notes (Signed)
See Botox procedure note 

## 2015-03-30 NOTE — Procedures (Signed)
Procedure Note:  BOTOX  Attending: Dr. Shon Millet  I. 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 Risk discussed included, but were not limited pain and discomfort, bleeding, bruising, excessive weakness, venous thrombosis, muscle atrophy and dysphagia. A copy of the patient medication guide was given to the patient which explains the blackbox warning.  Patients identity and treatment sites confirmed Yes. .  Details of Procedure: Skin was cleaned with alcohol. A hollow monopolor needle was introduced to the target muscle. Prior to injection, the needle plunger was aspirated to make sure the needle was not within a blood vessel. There was no blood retrieved on aspiration.   Following is a summary of the muscles injected And the amount of Botulinum toxin used:   Dilution 0.9% preservative free saline mixed with 100 u Botox type A to make 10 U per 0.1cc  Injections  Left flexor digitorum profundus 50 units (total of 3 sites) Left flexor digitorum superficialis 50 units (total of 3 sites) Left flexor carpi ulnaris 25 units (total of 1 site) Left flexor carpi radialis 25 units (total of 1 site) TOTAL UNITS: 150 units  Agent: Botulinum Type A ( Onobotulinum Toxin type A ). 2 vials of Botox were used, each containing 100 units and freshly diluted with 1 mL of sterile, non-preserved saline  Total injected (Units): 150 units Total wasted (Units): 50 units   Pt tolerated procedure well without complications.  Reinjection is anticipated in 3 months.   II. Preoperative Diagnosis(es): Chronic migraine  Consent obtained from: The patient Benefits discussed included, but were not limited to decreased muscle tightness, increased joint range of motion, and  decreased pain. Risk discussed included, but were not limited pain and discomfort, bleeding, bruising, excessive weakness, venous thrombosis, muscle atrophy and dysphagia. A copy of the patient medication guide was given to the patient which explains the blackbox warning.  Patients identity and treatment sites confirmed Yes. .  Details of Procedure: Skin was cleaned with alcohol. Prior to injection, the needle plunger was aspirated to make sure the needle was not within a blood vessel. There was no blood retrieved on aspiration.   Following is a summary of the muscles injected And the amount of Botulinum toxin used:  Dilution 200 units were reconstituted with 4 mL 0.9% sodium chloride. Skin was cleaned with alcohol prior to injection. A 30-gauge, 0.5-inch needle was used.  Total: 4 mL (200 units)  # of injection sitesMuscleDose Total 1  Corrugator 0.1 mL (5 units) 1 Procerus 0.2 mL (10 units) 2 each sideFrontalis0.4 mL (20 units) 4 each sideTemporalis0.8 mL (40 units) 3 each sideOccipitalis0.6 mL (30 units) 2 each sideCervical paraspinals0.4 mL (20 units) 3 each sideTrapezius0.6 mL (30 units)  Total 31Totalused155 units Total wasted45 units   Pt tolerated procedure well without complications.  Reinjection is anticipated in 3 months.  Sarita Hakanson R. Everlena Cooper, DO

## 2015-04-01 ENCOUNTER — Encounter: Payer: Self-pay | Admitting: Physician Assistant

## 2015-04-01 ENCOUNTER — Ambulatory Visit (INDEPENDENT_AMBULATORY_CARE_PROVIDER_SITE_OTHER): Payer: Medicare Other | Admitting: Physician Assistant

## 2015-04-01 VITALS — BP 118/52 | HR 117 | Temp 98.1°F | Ht 61.0 in | Wt 178.6 lb

## 2015-04-01 DIAGNOSIS — R109 Unspecified abdominal pain: Secondary | ICD-10-CM | POA: Diagnosis not present

## 2015-04-01 LAB — POC URINALSYSI DIPSTICK (AUTOMATED)
Bilirubin, UA: NEGATIVE
Blood, UA: NEGATIVE
Glucose, UA: NEGATIVE
Ketones, UA: NEGATIVE
Leukocytes, UA: NEGATIVE
Nitrite, UA: NEGATIVE
Protein, UA: NEGATIVE
Spec Grav, UA: 1.015
Urobilinogen, UA: 0.2
pH, UA: 5.5

## 2015-04-01 NOTE — Progress Notes (Signed)
Patient presents to clinic today c/o low back pain and lower abdominal cramping x 2 weeks. Denies urinary urgency, hesitancy, frequency. Denies hematuria. Denies fever, chills. Endorses regular bowels.  Endorses cramping pain is occurring several times per day. Denies vaginal symptoms.  Denies recent travel or sick contact. Endorses sore throat and heart burn flare since yesterday associated with increased gaseousness. Denies chest congestion, sinus pain. Notes mild nasal congestion.   Past Medical History  Diagnosis Date  . Migraine   . High cholesterol   . Depression   . Anxiety   . Dysarthria     Left arm  . History of stroke     a. 04/2014 MRI Brain: mod large chronic right MCA territory, chronic right cervical ICA dissection, smooth irregularity of the distal L cervical ICA consistent w/ underlying FMD, occlusion of multiple R MCA branch vessels in area of chronic infarct.   . Asthma   . Fibromuscular dysplasia (HCC)   . Left hemiparesis (HCC)   . Chest pain     a. 10/2014 CTA Chest: no dissection.    Current Outpatient Prescriptions on File Prior to Visit  Medication Sig Dispense Refill  . albuterol (PROAIR HFA) 108 (90 BASE) MCG/ACT inhaler Inhale 2 puffs into the lungs every 6 (six) hours as needed for wheezing or shortness of breath. 1 Inhaler 12  . amitriptyline (ELAVIL) 25 MG tablet TAKE 1 TABLET(25 MG) BY MOUTH AT BEDTIME 30 tablet 3  . Ascorbic Acid (VITAMIN C) 1000 MG tablet Take 1,000 mg by mouth daily.    Marland Kitchen aspirin 325 MG tablet Take 325 mg by mouth daily.    Marland Kitchen atorvastatin (LIPITOR) 20 MG tablet TAKE 1 TABLET BY MOUTH EVERY DAY 30 tablet 3  . cetirizine (ZYRTEC) 10 MG tablet Take 1 tablet (10 mg total) by mouth daily. 30 tablet 11  . fluticasone-salmeterol (ADVAIR HFA) 115-21 MCG/ACT inhaler Inhale 2 puffs into the lungs 2 (two) times daily. 1 Inhaler 12  . gabapentin (NEURONTIN) 300 MG capsule Take 1 capsule (300 mg total) by mouth 2 (two) times daily. 60 capsule 6  .  omeprazole (PRILOSEC) 20 MG capsule TAKE 1 CAPSULE(20 MG) BY MOUTH TWICE DAILY BEFORE A MEAL 60 capsule 6  . promethazine (PHENERGAN) 12.5 MG suppository Place 1 suppository (12.5 mg total) rectally every 6 (six) hours as needed for nausea or vomiting. 12 each 0  . promethazine (PHENERGAN) 25 MG tablet TAKE 1 TABLET BY MOUTH EVERY 6 HOURS AS NEEDED FOR NAUSEA AND VOMITING 30 tablet 3  . venlafaxine XR (EFFEXOR-XR) 150 MG 24 hr capsule TAKE 1 CAPSULE(150 MG) BY MOUTH DAILY WITH BREAKFAST 30 capsule 5  . Vitamin D, Ergocalciferol, (DRISDOL) 50000 units CAPS capsule Take 1 capsule (50,000 Units total) by mouth every 7 (seven) days. 10 capsule 0   No current facility-administered medications on file prior to visit.    Allergies  Allergen Reactions  . Acetaminophen Hives  . Fish Allergy     Hives and migraines  . Hydromorphone Other (See Comments)    Could not speak or keep her eyes open  . Migraine Formula  [Aspirin-Acetaminophen-Caffeine] Hives    pt is able to eat shellfish without a problem.   . Ondansetron Nausea And Vomiting  . Tylenol [Acetaminophen] Hives  . Duloxetine Hcl Rash    hallucinations    Family History  Problem Relation Age of Onset  . Cancer Mother   . Aortic aneurysm Father     Social History   Social  History  . Marital Status: Married    Spouse Name: [name]   . Number of Children: 3  . Years of Education: 10 th   Occupational History  .      Disabled   Social History Main Topics  . Smoking status: Former Smoker    Quit date: 02/22/2011  . Smokeless tobacco: Never Used     Comment: Quit 2013  . Alcohol Use: No  . Drug Use: Yes    Special: Marijuana  . Sexual Activity: Not Asked   Other Topics Concern  . None   Social History Narrative   Patient lives at home with her husband [name].   Disabled.   Education 10 th   Right handed.   Caffeine six cup of coffee daily.     Review of Systems - See HPI.  All other ROS are negative.  BP 118/52  mmHg  Pulse 117  Temp(Src) 98.1 F (36.7 C) (Oral)  Ht  (1.549 m)  Wt 178 lb 9.6 oz (81.012 kg)  BMI 33.76 kg/m2  SpO2 98%  Physical Exam  Constitutional: She is oriented to person, place, and time and well-developed, well-nourished, and in no distress.  HENT:  Head: Normocephalic and atraumatic.  Right Ear: External ear normal.  Left Ear: External ear normal.  Nose: Nose normal.  Mouth/Throat: Oropharynx is clear and moist. No oropharyngeal exudate.  TM within normal limits.  Eyes: Conjunctivae are normal.  Neck: Neck supple.  Cardiovascular: Normal rate, regular rhythm, normal heart sounds and intact distal pulses.   Pulmonary/Chest: Effort normal and breath sounds normal. No respiratory distress. She has no wheezes. She has no rales. She exhibits no tenderness.  Abdominal: Soft. Bowel sounds are normal. She exhibits no distension and no mass. There is no tenderness. There is no rebound and no guarding.  Neurological: She is alert and oriented to person, place, and time.  Skin: Skin is warm and dry. No rash noted.  Psychiatric: Affect normal.  Vitals reviewed.   Recent Results (from the past 2160 hour(s))  CBC w/Diff     Status: None   Collection Time: 01/27/15  2:24 PM  Result Value Ref Range   WBC 7.1 4.0 - 10.5 K/uL   RBC 4.68 3.87 - 5.11 Mil/uL   Hemoglobin 14.1 12.0 - 15.0 g/dL   HCT 16.1 09.6 - 04.5 %   MCV 93.7 78.0 - 100.0 fl   MCHC 32.1 30.0 - 36.0 g/dL   RDW 40.9 81.1 - 91.4 %   Platelets 269.0 150.0 - 400.0 K/uL   Neutrophils Relative % 49.4 43.0 - 77.0 %   Lymphocytes Relative 41.7 12.0 - 46.0 %   Monocytes Relative 5.1 3.0 - 12.0 %   Eosinophils Relative 2.9 0.0 - 5.0 %   Basophils Relative 0.9 0.0 - 3.0 %   Neutro Abs 3.5 1.4 - 7.7 K/uL   Lymphs Abs 2.9 0.7 - 4.0 K/uL   Monocytes Absolute 0.4 0.1 - 1.0 K/uL   Eosinophils Absolute 0.2 0.0 - 0.7 K/uL   Basophils Absolute 0.1 0.0 - 0.1 K/uL  Basic Metabolic Panel (BMET)     Status: Abnormal    Collection Time: 01/27/15  2:24 PM  Result Value Ref Range   Sodium 142 135 - 145 mEq/L   Potassium 4.2 3.5 - 5.1 mEq/L   Chloride 104 96 - 112 mEq/L   CO2 25 19 - 32 mEq/L   Glucose, Bld 68 (L) 70 - 99 mg/dL   BUN 3 (  L) 6 - 23 mg/dL   Creatinine, Ser 2.84 0.40 - 1.20 mg/dL   Calcium 9.5 8.4 - 13.2 mg/dL   GFR 44.01 >02.72 mL/min  B Nat Peptide     Status: None   Collection Time: 01/27/15  2:24 PM  Result Value Ref Range   Pro B Natriuretic peptide (BNP) 7.0 0.0 - 100.0 pg/mL  Vitamin D (25 hydroxy)     Status: Abnormal   Collection Time: 02/25/15  3:41 PM  Result Value Ref Range   VITD 23.41 (L) 30.00 - 100.00 ng/mL  PTH, Intact and Calcium     Status: Abnormal   Collection Time: 02/25/15  3:41 PM  Result Value Ref Range   PTH 90 (H) 14 - 64 pg/mL   Calcium 9.7 8.4 - 10.5 mg/dL    Comment:   Interpretive Guide:                              Intact PTH               Calcium                              ----------               ------- Normal Parathyroid           Normal                   Normal Hypoparathyroidism           Low or Low Normal        Low Hyperparathyroidism      Primary                 Normal or High           High      Secondary               High                     Normal or Low      Tertiary                High                     High Non-Parathyroid   Hypercalcemia              Low or Low Normal        High     Assessment/Plan: Abdominal cramping Has chronic history of this. Has been alternating Digestive enzymes with Bentyl. Acute flare suspected due to a viral syndrome giving other nonspecific URI symptoms and unremarkable exam. Will add on Zantac 150 in the evening. Continue chronic medications as directed. Add on a daily probiotic. Diet suggesting given in handout. Follow-up if symptoms are not resolving.

## 2015-04-01 NOTE — Assessment & Plan Note (Signed)
Has chronic history of this. Has been alternating Digestive enzymes with Bentyl. Acute flare suspected due to a viral syndrome giving other nonspecific URI symptoms and unremarkable exam. Will add on Zantac 150 in the evening. Continue chronic medications as directed. Add on a daily probiotic. Diet suggesting given in handout. Follow-up if symptoms are not resolving.

## 2015-04-01 NOTE — Patient Instructions (Signed)
Please increase fluids. Take the probiotic daily. Add on a Zantac 150 mg in the evening. Continue Prilosec as directed. Rest as much as possible. Follow the diet below.  In regards to sore throat -- feel this is mostly likley viral in combination with some mild post-nasal drip. The Zantac will actually help with nasal symptoms. Again stay hydrated and start a saline nasal spray to keep nasal passages clear.  I will call you with urine culture results and once I have checked in with [employer] regarding assistance with your medications.  Food Choices to Help Relieve Diarrhea, Adult When you have diarrhea, the foods you eat and your eating habits are very important. Choosing the right foods and drinks can help relieve diarrhea. Also, because diarrhea can last up to 7 days, you need to replace lost fluids and electrolytes (such as sodium, potassium, and chloride) in order to help prevent dehydration.  WHAT GENERAL GUIDELINES DO I NEED TO FOLLOW?  Slowly drink 1 cup (8 oz) of fluid for each episode of diarrhea. If you are getting enough fluid, your urine will be clear or pale yellow.  Eat starchy foods. Some good choices include white rice, white toast, pasta, low-fiber cereal, baked potatoes (without the skin), saltine crackers, and bagels.  Avoid large servings of any cooked vegetables.  Limit fruit to two servings per day. A serving is  cup or 1 small piece.  Choose foods with less than 2 g of fiber per serving.  Limit fats to less than 8 tsp (38 g) per day.  Avoid fried foods.  Eat foods that have probiotics in them. Probiotics can be found in certain dairy products.  Avoid foods and beverages that may increase the speed at which food moves through the stomach and intestines (gastrointestinal tract). Things to avoid include:  High-fiber foods, such as dried fruit, raw fruits and vegetables, nuts, seeds, and whole grain foods.  Spicy foods and high-fat foods.  Foods and beverages  sweetened with high-fructose corn syrup, honey, or sugar alcohols such as xylitol, sorbitol, and mannitol. WHAT FOODS ARE RECOMMENDED? Grains White rice. White, [place], or pita breads (fresh or toasted), including plain rolls, buns, or bagels. White pasta. Saltine, soda, or graham crackers. Pretzels. Low-fiber cereal. Cooked cereals made with water (such as cornmeal, farina, or cream cereals). Plain muffins. Matzo. Melba toast. Zwieback.  Vegetables Potatoes (without the skin). Strained tomato and vegetable juices. Most well-cooked and canned vegetables without seeds. Tender lettuce. Fruits Cooked or canned applesauce, apricots, cherries, fruit cocktail, grapefruit, peaches, pears, or plums. Fresh bananas, apples without skin, cherries, grapes, cantaloupe, grapefruit, peaches, oranges, or plums.  Meat and Other Protein Products Baked or boiled chicken. Eggs. Tofu. Fish. Seafood. Smooth peanut butter. Ground or well-cooked tender beef, ham, veal, lamb, pork, or poultry.  Dairy Plain yogurt, kefir, and unsweetened liquid yogurt. Lactose-free milk, buttermilk, or soy milk. Plain hard cheese. Beverages Sport drinks. Clear broths. Diluted fruit juices (except prune). Regular, caffeine-free sodas such as ginger ale. Water. Decaffeinated teas. Oral rehydration solutions. Sugar-free beverages not sweetened with sugar alcohols. Other Bouillon, broth, or soups made from recommended foods.  The items listed above may not be a complete list of recommended foods or beverages. Contact your dietitian for more options. WHAT FOODS ARE NOT RECOMMENDED? Grains Whole grain, whole wheat, bran, or rye breads, rolls, pastas, crackers, and cereals. Wild or brown rice. Cereals that contain more than 2 g of fiber per serving. Corn tortillas or taco shells. Cooked or dry oatmeal. Granola.  Popcorn. Vegetables Raw vegetables. Cabbage, broccoli, Brussels sprouts, artichokes, baked beans, beet greens, corn, kale, legumes,  peas, sweet potatoes, and yams. Potato skins. Cooked spinach and cabbage. Fruits Dried fruit, including raisins and dates. Raw fruits. Stewed or dried prunes. Fresh apples with skin, apricots, mangoes, pears, raspberries, and strawberries.  Meat and Other Protein Products Chunky peanut butter. Nuts and seeds. Beans and lentils. Tomasa Blase.  Dairy High-fat cheeses. Milk, chocolate milk, and beverages made with milk, such as milk shakes. Cream. Ice cream. Sweets and Desserts Sweet rolls, doughnuts, and sweet breads. Pancakes and waffles. Fats and Oils Butter. Cream sauces. Margarine. Salad oils. Plain salad dressings. Olives. Avocados.  Beverages Caffeinated beverages (such as coffee, tea, soda, or energy drinks). Alcoholic beverages. Fruit juices with pulp. Prune juice. Soft drinks sweetened with high-fructose corn syrup or sugar alcohols. Other Coconut. Hot sauce. Chili powder. Mayonnaise. Gravy. Cream-based or milk-based soups.  The items listed above may not be a complete list of foods and beverages to avoid. Contact your dietitian for more information. WHAT SHOULD I DO IF I BECOME DEHYDRATED? Diarrhea can sometimes lead to dehydration. Signs of dehydration include dark urine and dry mouth and skin. If you think you are dehydrated, you should rehydrate with an oral rehydration solution. These solutions can be purchased at pharmacies, retail stores, or online.  Drink -1 cup (120-240 mL) of oral rehydration solution each time you have an episode of diarrhea. If drinking this amount makes your diarrhea worse, try drinking smaller amounts more often. For example, drink 1-3 tsp (5-15 mL) every 5-10 minutes.  A general rule for staying hydrated is to drink 1-2 L of fluid per day. Talk to your health care provider about the specific amount you should be drinking each day. Drink enough fluids to keep your urine clear or pale yellow.   This information is not intended to replace advice given to you by  your health care provider. Make sure you discuss any questions you have with your health care provider.   Document Released: 04/30/2003 Document Revised: 02/28/2014 Document Reviewed: 12/31/2012 Elsevier Interactive Patient Education [employer]

## 2015-04-01 NOTE — Addendum Note (Signed)
Addended by: Verdie Shire on: 04/01/2015 09:58 AM   Modules accepted: Orders

## 2015-04-01 NOTE — Progress Notes (Signed)
Pre visit review using our clinic review tool, if applicable. No additional management support is needed unless otherwise documented below in the visit note. 

## 2015-04-04 LAB — CULTURE, URINE COMPREHENSIVE: Colony Count: 10000

## 2015-04-06 ENCOUNTER — Encounter: Payer: Self-pay | Admitting: Physician Assistant

## 2015-04-06 DIAGNOSIS — R1031 Right lower quadrant pain: Secondary | ICD-10-CM

## 2015-04-06 DIAGNOSIS — R1032 Left lower quadrant pain: Principal | ICD-10-CM

## 2015-04-06 DIAGNOSIS — R102 Pelvic and perineal pain: Secondary | ICD-10-CM

## 2015-04-07 ENCOUNTER — Ambulatory Visit (HOSPITAL_BASED_OUTPATIENT_CLINIC_OR_DEPARTMENT_OTHER)
Admission: RE | Admit: 2015-04-07 | Discharge: 2015-04-07 | Disposition: A | Discharge status: Home / Self Care | Payer: Medicare Other | Source: Ambulatory Visit | Attending: Physician Assistant | Admitting: Physician Assistant

## 2015-04-07 ENCOUNTER — Other Ambulatory Visit: Payer: Self-pay | Admitting: General Practice

## 2015-04-07 ENCOUNTER — Other Ambulatory Visit: Payer: Self-pay | Admitting: Physician Assistant

## 2015-04-07 ENCOUNTER — Telehealth: Payer: Self-pay | Admitting: General Practice

## 2015-04-07 DIAGNOSIS — R102 Pelvic and perineal pain: Secondary | ICD-10-CM

## 2015-04-07 DIAGNOSIS — R1031 Right lower quadrant pain: Secondary | ICD-10-CM | POA: Diagnosis not present

## 2015-04-07 DIAGNOSIS — R1032 Left lower quadrant pain: Principal | ICD-10-CM

## 2015-04-07 DIAGNOSIS — Z90710 Acquired absence of both cervix and uterus: Secondary | ICD-10-CM | POA: Insufficient documentation

## 2015-04-07 MED ORDER — CEPHALEXIN 500 MG PO CAPS: 500.0000 mg | ORAL_CAPSULE | Freq: Two times a day (BID) | ORAL | Status: AC

## 2015-04-07 NOTE — Telephone Encounter (Signed)
Called and spoke with Nigel Bridgeman with Patient Assistance. He advised that pt is covered by Patient assistance for her Effexor until December 31,2017.

## 2015-04-10 ENCOUNTER — Ambulatory Visit (INDEPENDENT_AMBULATORY_CARE_PROVIDER_SITE_OTHER): Payer: Medicare Other | Admitting: Physician Assistant

## 2015-04-10 ENCOUNTER — Encounter: Payer: Self-pay | Admitting: Physician Assistant

## 2015-04-10 VITALS — BP 109/66 | HR 117 | Temp 98.1°F | Ht 61.0 in | Wt 176.2 lb

## 2015-04-10 DIAGNOSIS — R109 Unspecified abdominal pain: Secondary | ICD-10-CM

## 2015-04-10 DIAGNOSIS — N3000 Acute cystitis without hematuria: Secondary | ICD-10-CM

## 2015-04-10 LAB — POC URINALSYSI DIPSTICK (AUTOMATED)
Bilirubin, UA: NEGATIVE
Blood, UA: NEGATIVE
Glucose, UA: NEGATIVE
Ketones, UA: NEGATIVE
Leukocytes, UA: NEGATIVE
Nitrite, UA: NEGATIVE
Protein, UA: NEGATIVE
Spec Grav, UA: 1.015
Urobilinogen, UA: 0.2
pH, UA: 6

## 2015-04-10 LAB — CBC
HCT: 40.3 % (ref 36.0–46.0)
Hemoglobin: 13.6 g/dL (ref 12.0–15.0)
MCH: 30.9 pg (ref 26.0–34.0)
MCHC: 33.7 g/dL (ref 30.0–36.0)
MCV: 91.6 fL (ref 78.0–100.0)
MPV: 10.1 fL (ref 8.6–12.4)
Platelets: 332 10*3/uL (ref 150–400)
RBC: 4.4 MIL/uL (ref 3.87–5.11)
RDW: 13.6 % (ref 11.5–15.5)
WBC: 7.3 10*3/uL (ref 4.0–10.5)

## 2015-04-10 LAB — COMPREHENSIVE METABOLIC PANEL
ALT: 16 U/L (ref 6–29)
AST: 23 U/L (ref 10–35)
Albumin: 4 g/dL (ref 3.6–5.1)
Alkaline Phosphatase: 115 U/L (ref 33–115)
BUN: 5 mg/dL — ABNORMAL LOW (ref 7–25)
CO2: 25 mmol/L (ref 20–31)
Calcium: 9.1 mg/dL (ref 8.6–10.2)
Chloride: 104 mmol/L (ref 98–110)
Creat: 0.64 mg/dL (ref 0.50–1.10)
Glucose, Bld: 87 mg/dL (ref 65–99)
Potassium: 4 mmol/L (ref 3.5–5.3)
Sodium: 140 mmol/L (ref 135–146)
Total Bilirubin: 0.3 mg/dL (ref 0.2–1.2)
Total Protein: 7 g/dL (ref 6.1–8.1)

## 2015-04-10 MED ORDER — DICYCLOMINE HCL 10 MG PO CAPS: 10.0000 mg | ORAL_CAPSULE | Freq: Three times a day (TID) | ORAL | Status: DC

## 2015-04-10 NOTE — Progress Notes (Signed)
Pre visit review using our clinic review tool, if applicable. No additional management support is needed unless otherwise documented below in the visit note. 

## 2015-04-10 NOTE — Patient Instructions (Signed)
Please go to the lab for blood work. Continue the Probiotic and restart your Prilosec taking daily as directed. Increase fluids and take the Bentyl as directed for cramping.  We will alter your regimen based on results. If all normal and symptoms are persisting we will have to get you back in with Gastroenterology.

## 2015-04-10 NOTE — Progress Notes (Signed)
Patient presents to clinic today for follow-up of lower abdominal pain and UTI. Patient endorses taking Keflex as directed with resolution of urinary symptoms. Is still having these episodic pains in bilateral lower abdomen described as a "spasm" like pain. Patient endorses normal bowel habits. Denies nausea or vomiting. Does note some mild heartburn but has not been taking her PPI. Denies fever, chills. US abdomen and pelvis obtained which was unremarkable.  Past Medical History  Diagnosis Date  . Migraine   . High cholesterol   . Depression   . Anxiety   . Dysarthria     Left arm  . History of stroke     a. 04/2014 MRI Brain: mod large chronic right MCA territory, chronic right cervical ICA dissection, smooth irregularity of the distal L cervical ICA consistent w/ underlying FMD, occlusion of multiple R MCA branch vessels in area of chronic infarct.   . Asthma   . Fibromuscular dysplasia ([place])   . Left hemiparesis ([place])   . Chest pain     a. 10/2014 CTA Chest: no dissection.    Current Outpatient Prescriptions on File Prior to Visit  Medication Sig Dispense Refill  . albuterol (PROAIR HFA) 108 (90 BASE) MCG/ACT inhaler Inhale 2 puffs into the lungs every 6 (six) hours as needed for wheezing or shortness of breath. 1 Inhaler 12  . amitriptyline (ELAVIL) 25 MG tablet TAKE 1 TABLET(25 MG) BY MOUTH AT BEDTIME 30 tablet 3  . Ascorbic Acid (VITAMIN C) 1000 MG tablet Take 1,000 mg by mouth daily.    Marland Kitchen aspirin 325 MG tablet Take 325 mg by mouth daily.    Marland Kitchen atorvastatin (LIPITOR) 20 MG tablet TAKE 1 TABLET BY MOUTH EVERY DAY 30 tablet 3  . cephALEXin (KEFLEX) 500 MG capsule Take 1 capsule (500 mg total) by mouth 2 (two) times daily. 10 capsule 0  . cetirizine (ZYRTEC) 10 MG tablet Take 1 tablet (10 mg total) by mouth daily. 30 tablet 11  . fluticasone-salmeterol (ADVAIR HFA) 115-21 MCG/ACT inhaler Inhale 2 puffs into the lungs 2 (two) times daily. 1 Inhaler 12  . gabapentin (NEURONTIN) 300  MG capsule Take 1 capsule (300 mg total) by mouth 2 (two) times daily. 60 capsule 6  . omeprazole (PRILOSEC) 20 MG capsule TAKE 1 CAPSULE(20 MG) BY MOUTH TWICE DAILY BEFORE A MEAL 60 capsule 6  . promethazine (PHENERGAN) 12.5 MG suppository Place 1 suppository (12.5 mg total) rectally every 6 (six) hours as needed for nausea or vomiting. 12 each 0  . promethazine (PHENERGAN) 25 MG tablet TAKE 1 TABLET BY MOUTH EVERY 6 HOURS AS NEEDED FOR NAUSEA AND VOMITING 30 tablet 3  . venlafaxine XR (EFFEXOR-XR) 150 MG 24 hr capsule TAKE 1 CAPSULE(150 MG) BY MOUTH DAILY WITH BREAKFAST 30 capsule 5  . Vitamin D, Ergocalciferol, (DRISDOL) 50000 units CAPS capsule Take 1 capsule (50,000 Units total) by mouth every 7 (seven) days. 10 capsule 0   No current facility-administered medications on file prior to visit.    Allergies  Allergen Reactions  . Acetaminophen Hives  . Fish Allergy     Hives and migraines  . Hydromorphone Other (See Comments)    Could not speak or keep her eyes open  . Migraine Formula  [Aspirin-Acetaminophen-Caffeine] Hives    pt is able to eat shellfish without a problem.   . Ondansetron Nausea And Vomiting  . Tylenol [Acetaminophen] Hives  . Duloxetine Hcl Rash    hallucinations    Family History  Problem  Relation Age of Onset  . Cancer Mother   . Aortic aneurysm Father     Social History   Social History  . Marital Status: Married    Spouse Name: Marya Amsler   . Number of Children: 3  . Years of Education: 10 th   Occupational History  .      Disabled   Social History Main Topics  . Smoking status: Former Smoker    Quit date: 02/22/2011  . Smokeless tobacco: Never Used     Comment: Quit 2013  . Alcohol Use: No  . Drug Use: Yes    Special: Marijuana  . Sexual Activity: Not Asked   Other Topics Concern  . None   Social History Narrative   Patient lives at home with her husband Belenda Cruise.   Disabled.   Education 10 th   Right handed.   Caffeine six cup of coffee  daily.      Review of Systems - See HPI.  All other ROS are negative.  BP 109/66 mmHg  Pulse 117  Temp(Src) 98.1 F (36.7 C) (Oral)  Ht _0  (1.549 m)  Wt 176 lb 3.2 oz (79.924 kg)  BMI 33.31 kg/m2  SpO2 96%  Physical Exam  Constitutional: She is oriented to person, place, and time and well-developed, well-nourished, and in no distress.  HENT:  Head: Normocephalic and atraumatic.  Cardiovascular: Regular rhythm, normal heart sounds and intact distal pulses.   Pulmonary/Chest: Effort normal and breath sounds normal. No respiratory distress. She has no wheezes. She has no rales. She exhibits no tenderness.  Abdominal: Soft. Bowel sounds are normal. She exhibits no distension and no mass. There is no tenderness. There is no rebound and no guarding.  Neurological: She is alert and oriented to person, place, and time.  Skin: Skin is warm and dry. No rash noted.  Psychiatric: Affect normal.  Vitals reviewed.   Recent Results (from the past 2160 hour(s))  CBC w/Diff     Status: None   Collection Time: 01/27/15  2:24 PM  Result Value Ref Range   WBC 7.1 4.0 - 10.5 K/uL   RBC 4.68 3.87 - 5.11 Mil/uL   Hemoglobin 14.1 12.0 - 15.0 g/dL   HCT 43.9 36.0 - 46.0 %   MCV 93.7 78.0 - 100.0 fl   MCHC 32.1 30.0 - 36.0 g/dL   RDW 13.6 11.5 - 15.5 %   Platelets 269.0 150.0 - 400.0 K/uL   Neutrophils Relative % 49.4 43.0 - 77.0 %   Lymphocytes Relative 41.7 12.0 - 46.0 %   Monocytes Relative 5.1 3.0 - 12.0 %   Eosinophils Relative 2.9 0.0 - 5.0 %   Basophils Relative 0.9 0.0 - 3.0 %   Neutro Abs 3.5 1.4 - 7.7 K/uL   Lymphs Abs 2.9 0.7 - 4.0 K/uL   Monocytes Absolute 0.4 0.1 - 1.0 K/uL   Eosinophils Absolute 0.2 0.0 - 0.7 K/uL   Basophils Absolute 0.1 0.0 - 0.1 K/uL  Basic Metabolic Panel (BMET)     Status: Abnormal   Collection Time: 01/27/15  2:24 PM  Result Value Ref Range   Sodium 142 135 - 145 mEq/L   Potassium 4.2 3.5 - 5.1 mEq/L   Chloride 104 96 - 112 mEq/L   CO2 25 19 - 32  mEq/L   Glucose, Bld 68 (L) 70 - 99 mg/dL   BUN 3 (L) 6 - 23 mg/dL   Creatinine, Ser 0.74 0.40 - 1.20 mg/dL   Calcium 9.5  8.4 - 10.5 mg/dL   GFR 90.16 >60.00 mL/min  B Nat Peptide     Status: None   Collection Time: 01/27/15  2:24 PM  Result Value Ref Range   Pro B Natriuretic peptide (BNP) 7.0 0.0 - 100.0 pg/mL  Vitamin D (25 hydroxy)     Status: Abnormal   Collection Time: 02/25/15  3:41 PM  Result Value Ref Range   VITD 23.41 (L) 30.00 - 100.00 ng/mL  PTH, Intact and Calcium     Status: Abnormal   Collection Time: 02/25/15  3:41 PM  Result Value Ref Range   PTH 90 (H) 14 - 64 pg/mL   Calcium 9.7 8.4 - 10.5 mg/dL    Comment:   Interpretive Guide:                              Intact PTH               Calcium                              ----------               ------- Normal Parathyroid           Normal                   Normal Hypoparathyroidism           Low or Low Normal        Low Hyperparathyroidism      Primary                 Normal or High           High      Secondary               High                     Normal or Low      Tertiary                High                     High Non-Parathyroid   Hypercalcemia              Low or Low Normal        High   POCT Urinalysis Dipstick (Automated)     Status: None   Collection Time: 04/01/15  9:57 AM  Result Value Ref Range   Color, UA yellow    Clarity, UA clear    Glucose, UA neg    Bilirubin, UA neg    Ketones, UA neg    Spec Grav, UA 1.015    Blood, UA neg    pH, UA 5.5    Protein, UA neg    Urobilinogen, UA 0.2    Nitrite, UA neg    Leukocytes, UA Negative Negative  CULTURE, URINE COMPREHENSIVE     Status: None   Collection Time: 04/01/15 10:05 AM  Result Value Ref Range   Culture STAPHYLOCOCCUS SPECIES (COAGULASE NEGATIVE)    Colony Count 10,000 COLONIES/ML    Organism ID, Bacteria STAPHYLOCOCCUS SPECIES (COAGULASE NEGATIVE)     Comment: Rifampin and Gentamicin should not be used as single drugs for  treatment of Staph infections.       Susceptibility  Staphylococcus species (coagulase negative) -  (no method available)    OXACILLIN <=0.25 Sensitive     CEFAZOLIN  Sensitive     GENTAMICIN <=0.5 Sensitive     CIPROFLOXACIN <=0.5 Sensitive     LEVOFLOXACIN <=0.12 Sensitive     NITROFURANTOIN <=16 Sensitive     TRIMETH/SULFA <=10 Sensitive     VANCOMYCIN <=0.5 Sensitive     RIFAMPIN <=0.5 Sensitive     TETRACYCLINE <=1 Sensitive   CBC     Status: None   Collection Time: 04/10/15  4:12 PM  Result Value Ref Range   WBC 7.3 4.0 - 10.5 K/uL   RBC 4.40 3.87 - 5.11 MIL/uL   Hemoglobin 13.6 12.0 - 15.0 g/dL   HCT 40.3 36.0 - 46.0 %   MCV 91.6 78.0 - 100.0 fL   MCH 30.9 26.0 - 34.0 pg   MCHC 33.7 30.0 - 36.0 g/dL   RDW 13.6 11.5 - 15.5 %   Platelets 332 150 - 400 K/uL   MPV 10.1 8.6 - 12.4 fL  Comp Met (CMET)     Status: Abnormal   Collection Time: 04/10/15  4:12 PM  Result Value Ref Range   Sodium 140 135 - 146 mmol/L   Potassium 4.0 3.5 - 5.3 mmol/L   Chloride 104 98 - 110 mmol/L   CO2 25 20 - 31 mmol/L   Glucose, Bld 87 65 - 99 mg/dL   BUN 5 (L) 7 - 25 mg/dL   Creat 0.64 0.50 - 1.10 mg/dL   Total Bilirubin 0.3 0.2 - 1.2 mg/dL   Alkaline Phosphatase 115 33 - 115 U/L   AST 23 10 - 35 U/L   ALT 16 6 - 29 U/L   Total Protein 7.0 6.1 - 8.1 g/dL   Albumin 4.0 3.6 - 5.1 g/dL   Calcium 9.1 8.6 - 10.2 mg/dL  CULTURE, URINE COMPREHENSIVE     Status: None   Collection Time: 04/10/15  4:12 PM  Result Value Ref Range   Colony Count NO GROWTH    Organism ID, Bacteria NO GROWTH   POCT Urinalysis Dipstick (Automated)     Status: None   Collection Time: 04/10/15  5:30 PM  Result Value Ref Range   Color, UA light-yellow    Clarity, UA clear    Glucose, UA neg    Bilirubin, UA neg    Ketones, UA neg    Spec Grav, UA 1.015    Blood, UA neg    pH, UA 6.0    Protein, UA neg    Urobilinogen, UA 0.2    Nitrite, UA neg    Leukocytes, UA Negative Negative     Assessment/Plan: Abdominal cramping Imaging and prior labs unremarkable. Suspect intermittent intestinal spasms. Continue dietary measures and probiotic. Restart Omeprazole. Rx Bentyl to use as directed. If symptoms are not resolving, follow-up with GI.

## 2015-04-11 ENCOUNTER — Encounter: Payer: Self-pay | Admitting: Physician Assistant

## 2015-04-12 LAB — CULTURE, URINE COMPREHENSIVE
Colony Count: NO GROWTH
Organism ID, Bacteria: NO GROWTH

## 2015-04-12 NOTE — Assessment & Plan Note (Addendum)
Imaging and prior labs unremarkable. Suspect intermittent intestinal spasms. Continue dietary measures and probiotic. Restart Omeprazole. Rx Bentyl to use as directed. If symptoms are not resolving, follow-up with GI. Will repeat urine culture today to ensure resolution of UTI.

## 2015-04-22 ENCOUNTER — Other Ambulatory Visit: Payer: Self-pay | Admitting: Neurology

## 2015-04-22 NOTE — Telephone Encounter (Signed)
Last OV: 03/30/15 Next OV: 07/10/15

## 2015-04-23 ENCOUNTER — Encounter: Payer: Self-pay | Admitting: Physician Assistant

## 2015-04-23 MED ORDER — FLUCONAZOLE 150 MG PO TABS: 150.0000 mg | ORAL_TABLET | Freq: Once | ORAL | Status: DC

## 2015-05-05 ENCOUNTER — Ambulatory Visit: Payer: Medicare Other | Admitting: Internal Medicine

## 2015-05-22 ENCOUNTER — Other Ambulatory Visit: Payer: Self-pay | Admitting: Family Medicine

## 2015-05-25 ENCOUNTER — Telehealth: Payer: Self-pay | Admitting: Physician Assistant

## 2015-05-25 NOTE — Telephone Encounter (Signed)
Pt informed pharmacy would be [facility] on [address] (not [facility])

## 2015-05-25 NOTE — Telephone Encounter (Signed)
Please call to assess. I do not have her still on cyclobenzaprine. Neurology has placed her on Baclofen which she should be using instead.

## 2015-05-25 NOTE — Telephone Encounter (Signed)
Caller name: Anjannette  Relation to pt: self Call back number:[phone]  [id number] - [place], [place] - [address]  Reason for call: Pt informed was prescribed Cyclo 7.5 1 tid  Qty 90 (pt states it will cost her 30 pills for $313.27) that is too much to pay for pt and is wanting to know if she can get rx of Cycloben 5 mg 1 1/2 tID Qty 135 for 30 days (it will cost pt $15.00- pt can paid this amount) or rx can be Cyclo 10 mg Qty 90 (the cost of this would be $10.00- and pt can pay this amount). Please advise.

## 2015-05-26 NOTE — Telephone Encounter (Signed)
[facility] @ [phone]) asking the pt to RTC regarding note below.//AB/CMA

## 2015-05-27 MED ORDER — CYCLOBENZAPRINE HCL 10 MG PO TABS: 10.0000 mg | ORAL_TABLET | Freq: Three times a day (TID) | ORAL | Status: DC | PRN

## 2015-05-27 NOTE — Telephone Encounter (Signed)
[facility]  [facility] 6[facility] @ [phone]) informing the pt that Courtney BattenCody approved the Cyclobenzaprine and the note below.  Prescription sent to the pharmacy by e-script.//AB/CMA

## 2015-05-27 NOTE — Telephone Encounter (Signed)
Called and spoke with the pt and her husband and he stated that the pt is requesting to go back on the Cyclobenzaprine.   He stated that the pt was taking off because of cost reasons.  So [facility] she would like to stop the Baclofen and go back on the Cyclobenzaprine.    Please advise.//AB/CMA

## 2015-05-27 NOTE — Telephone Encounter (Signed)
Ok to send in Rx Cyclobenzaprine 10 mg TID pp quantity 90 with 1 refill. No driving while on medication.

## 2015-05-31 ENCOUNTER — Other Ambulatory Visit: Payer: Self-pay | Admitting: Physician Assistant

## 2015-06-03 ENCOUNTER — Ambulatory Visit (HOSPITAL_BASED_OUTPATIENT_CLINIC_OR_DEPARTMENT_OTHER)
Admission: RE | Admit: 2015-06-03 | Discharge: 2015-06-03 | Disposition: A | Discharge status: Home / Self Care | Payer: Medicare Other | Source: Ambulatory Visit | Attending: Medical | Admitting: Medical

## 2015-06-03 ENCOUNTER — Encounter: Payer: Self-pay | Admitting: Medical

## 2015-06-03 ENCOUNTER — Ambulatory Visit (INDEPENDENT_AMBULATORY_CARE_PROVIDER_SITE_OTHER): Payer: Medicare Other | Admitting: Medical

## 2015-06-03 VITALS — BP 110/68 | HR 120 | Temp 98.0°F | Ht 61.0 in | Wt 176.6 lb

## 2015-06-03 DIAGNOSIS — R062 Wheezing: Secondary | ICD-10-CM

## 2015-06-03 DIAGNOSIS — M25572 Pain in left ankle and joints of left foot: Secondary | ICD-10-CM

## 2015-06-03 DIAGNOSIS — M7989 Other specified soft tissue disorders: Secondary | ICD-10-CM | POA: Diagnosis not present

## 2015-06-03 DIAGNOSIS — M25562 Pain in left knee: Secondary | ICD-10-CM

## 2015-06-03 DIAGNOSIS — R7989 Other specified abnormal findings of blood chemistry: Secondary | ICD-10-CM | POA: Diagnosis not present

## 2015-06-03 DIAGNOSIS — M79605 Pain in left leg: Secondary | ICD-10-CM | POA: Insufficient documentation

## 2015-06-03 DIAGNOSIS — F4322 Adjustment disorder with anxiety: Secondary | ICD-10-CM | POA: Diagnosis not present

## 2015-06-03 DIAGNOSIS — M79609 Pain in unspecified limb: Secondary | ICD-10-CM

## 2015-06-03 DIAGNOSIS — M79662 Pain in left lower leg: Secondary | ICD-10-CM | POA: Diagnosis not present

## 2015-06-03 LAB — VITAMIN D 25 HYDROXY (VIT D DEFICIENCY, FRACTURES): VITD: 53.66 ng/mL (ref 30.00–100.00)

## 2015-06-03 MED ORDER — KETOROLAC TROMETHAMINE 60 MG/2ML IM SOLN
60.0000 mg | Freq: Once | INTRAMUSCULAR | Status: AC
  Administered 2015-06-03: 60 mg via INTRAMUSCULAR

## 2015-06-03 MED ORDER — PREDNISONE 20 MG PO TABS: ORAL_TABLET | ORAL | Status: DC

## 2015-06-03 MED ORDER — LORAZEPAM 0.5 MG PO TABS: 0.5000 mg | ORAL_TABLET | Freq: Two times a day (BID) | ORAL | Status: DC | PRN

## 2015-06-03 NOTE — Progress Notes (Signed)
Subjective:    Patient ID: Courtney Garza, female    DOB: [birth year], 45 y.o.   MRN: [id number]  HPI  Pt in with left knee and ankle pain(for about a week). No fall or injury before this pain. Knee hurts worse. Pt states knee hurts worse and first. Then later ankle started hurting.  Pt not taking anything for pain. No otc meds.  Pt pulse is 123 when I checked. Pt just used proair  inhaler before she came in(using 2-3 times a day). Pt using advair once daily. Pt has tight and wheezing for 3-4 days.  Pt noticed some left popliteal pain last  night.  No pain on left side or rt side today.   Pt has been recently getting anxiety. Every day or every other day. Pt takes effexor every day for her mood. Pt youngest daughter passed away on 19-May-2015. Anxious since then.  Hx of low vitamin d. Has been on vitamin d once weekly. Just ran out of med.   Review of Systems  Constitutional: Negative for fever, chills and fatigue.  Respiratory: Positive for wheezing. Negative for cough and shortness of breath.        See hpi.  Cardiovascular: Negative for chest pain and palpitations.  Musculoskeletal:       Knee pain and ankle pain.  Lt popliteal pain last night. None presently.  Skin: Negative for rash.  Hematological: Negative for adenopathy. Does not bruise/bleed easily.  Psychiatric/Behavioral: The patient is nervous/anxious.     Past Medical History  Diagnosis Date  . Migraine   . High cholesterol   . Depression   . Anxiety   . Dysarthria     Left arm  . History of stroke     a. 04/2014 MRI Brain: mod large chronic right MCA territory, chronic right cervical ICA dissection, smooth irregularity of the distal L cervical ICA consistent w/ underlying FMD, occlusion of multiple R MCA branch vessels in area of chronic infarct.   . Asthma   . Fibromuscular dysplasia ([facility])   . Left hemiparesis ([facility])   . Chest pain     a. 10/2014 CTA Chest: no dissection.    Social History   Social  History  . Marital Status: Married    Spouse Name: [name]   . Number of Children: 3  . Years of Education: 10 th   Occupational History  .      Disabled   Social History Main Topics  . Smoking status: Former Smoker    Quit date: 02/22/2011  . Smokeless tobacco: Never Used     Comment: Quit 2013  . Alcohol Use: No  . Drug Use: Yes    Special: Marijuana  . Sexual Activity: Not on file   Other Topics Concern  . Not on file   Social History Narrative   Patient lives at home with her husband [name].   Disabled.   Education 10 th   Right handed.   Caffeine six cup of coffee daily.      Past Surgical History  Procedure Laterality Date  . Vaginal hysterectomy    . Appendectomy    . Breast enhancement surgery    . Vena cava filter placement      and removal    Family History  Problem Relation Age of Onset  . Cancer Mother   . Aortic aneurysm Father     Allergies  Allergen Reactions  . Acetaminophen Hives  . Fish Allergy  Hives and migraines  . Hydromorphone Other (See Comments)    Could not speak or keep her eyes open  . Migraine Formula  [Aspirin-Acetaminophen-Caffeine] Hives    pt is able to eat shellfish without a problem.   . Ondansetron Nausea And Vomiting  . Tylenol [Acetaminophen] Hives  . Duloxetine Hcl Rash    hallucinations    Current Outpatient Prescriptions on File Prior to Visit  Medication Sig Dispense Refill  . albuterol (PROAIR HFA) 108 (90 BASE) MCG/ACT inhaler Inhale 2 puffs into the lungs every 6 (six) hours as needed for wheezing or shortness of breath. 1 Inhaler 12  . amitriptyline (ELAVIL) 25 MG tablet TAKE 1 TABLET(25 MG) BY MOUTH AT BEDTIME 30 tablet 3  . Ascorbic Acid (VITAMIN C) 1000 MG tablet Take 1,000 mg by mouth daily.    Marland Kitchen aspirin 325 MG tablet Take 325 mg by mouth daily.    Marland Kitchen atorvastatin (LIPITOR) 20 MG tablet TAKE ONE TABLET BY MOUTH ONCE DAILY 30 tablet 0  . cetirizine (ZYRTEC) 10 MG tablet Take 1 tablet (10 mg total) by  mouth daily. 30 tablet 11  . cyclobenzaprine (FLEXERIL) 10 MG tablet Take 1 tablet (10 mg total) by mouth 3 (three) times daily as needed for muscle spasms. 90 tablet 1  . fluticasone-salmeterol (ADVAIR HFA) 115-21 MCG/ACT inhaler Inhale 2 puffs into the lungs 2 (two) times daily. 1 Inhaler 12  . gabapentin (NEURONTIN) 300 MG capsule Take 1 capsule (300 mg total) by mouth 2 (two) times daily. 60 capsule 6  . omeprazole (PRILOSEC) 20 MG capsule TAKE 1 CAPSULE(20 MG) BY MOUTH TWICE DAILY BEFORE A MEAL 60 capsule 6  . promethazine (PHENERGAN) 12.5 MG suppository Place 1 suppository (12.5 mg total) rectally every 6 (six) hours as needed for nausea or vomiting. 12 each 0  . promethazine (PHENERGAN) 25 MG tablet TAKE 1 TABLET BY MOUTH EVERY 6 HOURS AS NEEDED FOR NAUSEA AND VOMITING 30 tablet 3  . venlafaxine XR (EFFEXOR-XR) 150 MG 24 hr capsule TAKE 1 CAPSULE(150 MG) BY MOUTH DAILY WITH BREAKFAST 30 capsule 5   No current facility-administered medications on file prior to visit.    BP 110/68 mmHg  Pulse 120  Temp(Src) 98 F (36.7 C) (Oral)  Ht  (1.549 m)  Wt 176 lb 9.6 oz (80.105 kg)  BMI 33.39 kg/m2  SpO2 98%       Objective:   Physical Exam   General- No acute distress. Pleasant patient. Neck- Full range of motion, no jvd Lungs- Clear, even and unlabored. Heart- regular rate and rhythm. Neurologic- CNII- XII grossly intact.  Lt knee- tender to palpation tibial plateau both side. More medial tibial plateau.  Lt ankle- mild swelling. No crepitus.  Lt leg negative homans signs. No calf swelling.        Assessment & Plan:  For your knee pain and ankle pain will get xray. After xray if pain persisting may refer to ortho to evaluate both. But in particular meniscus of the knee.  For popliteal pain left leg last night will get lower ext [place] today. Stat.  Recent wheezing advise continue albuterol and advair. I am adding taper dose prednisone that you can start tomorrow. This may  help knee and ankle pain as well.  Get vitamin d level today.  For anxiety rx ativan. If you need further refills then contact pcp.  Follow up in 10-14 days or as needed  Note I think pt pulse being up is from albuterol which  she just took before came in but will go ahead and get lt lower ext doppler as precaution.

## 2015-06-03 NOTE — Progress Notes (Signed)
Pre visit review using our clinic review tool, if applicable. No additional management support is needed unless otherwise documented below in the visit note. 

## 2015-06-03 NOTE — Addendum Note (Signed)
Addended by: Neldon LabellaMABE, Bruchy Mikel S on: 06/03/2015 01:17 PM   Modules accepted: Orders

## 2015-06-03 NOTE — Patient Instructions (Addendum)
For your knee pain and ankle pain will get xray. After xray if pain persisting may refer to ortho to evaluate both. But in particular meniscus of the knee. Toradol 60 mg im today.  For popliteal pain left leg last night will get lower ext US today. Stat.  Recent wheezing advise continue albuterol and advair. I am adding taper dose prednisone that you can start tomorrow. This may help knee and ankle pain as well.  Get vitamin d level today.  For anxiety rx ativan. If you need further refills then contact pcp.  Follow up in 10-14 days or as needed

## 2015-06-22 ENCOUNTER — Other Ambulatory Visit: Payer: Self-pay | Admitting: Family Medicine

## 2015-06-30 ENCOUNTER — Other Ambulatory Visit: Payer: Self-pay

## 2015-06-30 MED ORDER — CETIRIZINE HCL 10 MG PO TABS: 10.0000 mg | ORAL_TABLET | Freq: Every day | ORAL | Status: DC

## 2015-07-07 ENCOUNTER — Encounter (HOSPITAL_COMMUNITY): Payer: Self-pay

## 2015-07-07 ENCOUNTER — Emergency Department (HOSPITAL_COMMUNITY): Payer: Medicare Other

## 2015-07-07 ENCOUNTER — Emergency Department (HOSPITAL_COMMUNITY)
Admission: EM | Admit: 2015-07-07 | Discharge: 2015-07-07 | Disposition: A | Discharge status: Home / Self Care | Payer: Medicare Other | Attending: Emergency Medicine | Admitting: Emergency Medicine

## 2015-07-07 DIAGNOSIS — R000 Tachycardia, unspecified: Secondary | ICD-10-CM | POA: Diagnosis not present

## 2015-07-07 DIAGNOSIS — Z79899 Other long term (current) drug therapy: Secondary | ICD-10-CM | POA: Insufficient documentation

## 2015-07-07 DIAGNOSIS — F329 Major depressive disorder, single episode, unspecified: Secondary | ICD-10-CM | POA: Diagnosis not present

## 2015-07-07 DIAGNOSIS — R51 Headache: Secondary | ICD-10-CM | POA: Diagnosis not present

## 2015-07-07 DIAGNOSIS — R200 Anesthesia of skin: Secondary | ICD-10-CM | POA: Diagnosis not present

## 2015-07-07 DIAGNOSIS — E7800 Pure hypercholesterolemia, unspecified: Secondary | ICD-10-CM | POA: Diagnosis not present

## 2015-07-07 DIAGNOSIS — J45909 Unspecified asthma, uncomplicated: Secondary | ICD-10-CM | POA: Diagnosis not present

## 2015-07-07 DIAGNOSIS — Z8673 Personal history of transient ischemic attack (TIA), and cerebral infarction without residual deficits: Secondary | ICD-10-CM

## 2015-07-07 DIAGNOSIS — Z87891 Personal history of nicotine dependence: Secondary | ICD-10-CM | POA: Diagnosis not present

## 2015-07-07 DIAGNOSIS — Z7951 Long term (current) use of inhaled steroids: Secondary | ICD-10-CM | POA: Diagnosis not present

## 2015-07-07 DIAGNOSIS — F419 Anxiety disorder, unspecified: Secondary | ICD-10-CM | POA: Insufficient documentation

## 2015-07-07 DIAGNOSIS — Z8669 Personal history of other diseases of the nervous system and sense organs: Secondary | ICD-10-CM | POA: Diagnosis not present

## 2015-07-07 DIAGNOSIS — R519 Headache, unspecified: Secondary | ICD-10-CM

## 2015-07-07 DIAGNOSIS — Z7982 Long term (current) use of aspirin: Secondary | ICD-10-CM | POA: Insufficient documentation

## 2015-07-07 DIAGNOSIS — Z8679 Personal history of other diseases of the circulatory system: Secondary | ICD-10-CM | POA: Diagnosis not present

## 2015-07-07 DIAGNOSIS — I6789 Other cerebrovascular disease: Secondary | ICD-10-CM | POA: Diagnosis not present

## 2015-07-07 LAB — BASIC METABOLIC PANEL
Anion gap: 12 (ref 5–15)
BUN: <5 mg/dL — ABNORMAL LOW (ref 6–20)
CO2: 24 mmol/L (ref 22–32)
Calcium: 9.5 mg/dL (ref 8.9–10.3)
Chloride: 105 mmol/L (ref 101–111)
Creatinine, Ser: 0.74 mg/dL (ref 0.44–1.00)
GFR calc Af Amer: >60 mL/min (ref >60)
GFR calc non Af Amer: >60 mL/min (ref >60)
Glucose, Bld: 99 mg/dL (ref 65–99)
Potassium: 3.2 mmol/L — ABNORMAL LOW (ref 3.5–5.1)
Sodium: 141 mmol/L (ref 135–145)

## 2015-07-07 LAB — CBC
HCT: 42.6 % (ref 36.0–46.0)
Hemoglobin: 13.4 g/dL (ref 12.0–15.0)
MCH: 30.2 pg (ref 26.0–34.0)
MCHC: 31.5 g/dL (ref 30.0–36.0)
MCV: 95.9 fL (ref 78.0–100.0)
Platelets: 294 10*3/uL (ref 150–400)
RBC: 4.44 MIL/uL (ref 3.87–5.11)
RDW: 13.5 % (ref 11.5–15.5)
WBC: 7.6 10*3/uL (ref 4.0–10.5)

## 2015-07-07 MED ORDER — SODIUM CHLORIDE 0.9 % IV BOLUS (SEPSIS)
1000.0000 mL | Freq: Once | INTRAVENOUS | Status: AC
  Administered 2015-07-07: 1000 mL via INTRAVENOUS

## 2015-07-07 MED ORDER — DEXAMETHASONE SODIUM PHOSPHATE 4 MG/ML IJ SOLN
6.0000 mg | Freq: Once | INTRAMUSCULAR | Status: AC
  Administered 2015-07-07: 6 mg via INTRAVENOUS
  Filled 2015-07-07: qty 2

## 2015-07-07 MED ORDER — MAGNESIUM SULFATE IN D5W 1-5 GM/100ML-% IV SOLN
1.0000 g | Freq: Once | INTRAVENOUS | Status: AC
  Administered 2015-07-07: 1 g via INTRAVENOUS
  Filled 2015-07-07: qty 100

## 2015-07-07 NOTE — ED Notes (Signed)
Patient verbalized understanding of discharge instructions and denies any further needs or questions at this time. VS stable. Patient ambulatory with steady gait.  

## 2015-07-07 NOTE — Discharge Instructions (Signed)
Courtney Garza,  Nice meeting you! Please follow-up with your neurologist on Friday. Return to the emergency department if you develop increased headache, slurred speech, weakness, new/worsening symptoms. Feel better soon!  S. Lane HackerNicole Mailani Degroote, PA-C General Headache Without Cause A headache is pain or discomfort felt around the head or neck area. The specific cause of a headache may not be found. There are many causes and types of headaches. A few common ones are:  Tension headaches.  Migraine headaches.  Cluster headaches.  Chronic daily headaches. HOME CARE INSTRUCTIONS  Watch your condition for any changes. Take these steps to help with your condition: Managing Pain  Take over-the-counter and prescription medicines only as told by your health care provider.  Lie down in a dark, quiet room when you have a headache.  If directed, apply ice to the head and neck area:  Put ice in a plastic bag.  Place a towel between your skin and the bag.  Leave the ice on for 20 minutes, 2-3 times per day.  Use a heating pad or hot shower to apply heat to the head and neck area as told by your health care provider.  Keep lights dim if bright lights bother you or make your headaches worse. Eating and Drinking  Eat meals on a regular schedule.  Limit alcohol use.  Decrease the amount of caffeine you drink, or stop drinking caffeine. General Instructions  Keep all follow-up visits as told by your health care provider. This is important.  Keep a headache journal to help find out what may trigger your headaches. For example, write down:  What you eat and drink.  How much sleep you get.  Any change to your diet or medicines.  Try massage or other relaxation techniques.  Limit stress.  Sit up straight, and do not tense your muscles.  Do not use tobacco products, including cigarettes, chewing tobacco, or e-cigarettes. If you need help quitting, ask your health care  provider.  Exercise regularly as told by your health care provider.  Sleep on a regular schedule. Get 7-9 hours of sleep, or the amount recommended by your health care provider. SEEK MEDICAL CARE IF:   Your symptoms are not helped by medicine.  You have a headache that is different from the usual headache.  You have nausea or you vomit.  You have a fever. SEEK IMMEDIATE MEDICAL CARE IF:   Your headache becomes severe.  You have repeated vomiting.  You have a stiff neck.  You have a loss of vision.  You have problems with speech.  You have pain in the eye or ear.  You have muscular weakness or loss of muscle control.  You lose your balance or have trouble walking.  You feel faint or pass out.  You have confusion.   This information is not intended to replace advice given to you by your health care provider. Make sure you discuss any questions you have with your health care provider.   Document Released: 02/07/2005 Document Revised: 10/29/2014 Document Reviewed: 06/02/2014 Elsevier Interactive Patient Education [employer]2016 [employer]

## 2015-07-07 NOTE — ED Notes (Signed)
RN made aware of BP 

## 2015-07-07 NOTE — ED Provider Notes (Signed)
CSN: [id number]     Arrival date & time 07/07/15  1707 History   First MD Initiated Contact with Patient 07/07/15 1715     Chief Complaint  Patient presents with  . Headache  . Numbness   HPI  TAYLIE HELDER is a 45 y.o. female PMH significant for stroke (04/2014, left sided hemiparesis), migraines (patient receives Botox every 3 months), high cholesterol, depression, anxiety presenting with a headache since 4 PM today. She describes her headache as constant, 7/10 pain scale, sharp/stabbing, beginning in left jaw radiating to chin as well as left parietal in location. She endorses numbness along her left jaw and dizziness (nonvertiginous) as well. No fevers, chills, neck stiffness, visual changes, slurred speech, facial droop, chest pain, shortness of breath, abdominal pain, nausea, vomiting, change in bowel or bladder habits. She takes aspirin daily for stroke prophylaxis.  Of note, she has an appointment with Dr. Shon Millet, neurologist, on Friday.  Past Medical History  Diagnosis Date  . Migraine   . High cholesterol   . Depression   . Anxiety   . Dysarthria     Left arm  . History of stroke     a. 04/2014 MRI Brain: mod large chronic right MCA territory, chronic right cervical ICA dissection, smooth irregularity of the distal L cervical ICA consistent w/ underlying FMD, occlusion of multiple R MCA branch vessels in area of chronic infarct.   . Asthma   . Fibromuscular dysplasia (HCC)   . Left hemiparesis (HCC)   . Chest pain     a. 10/2014 CTA Chest: no dissection.   Past Surgical History  Procedure Laterality Date  . Vaginal hysterectomy    . Appendectomy    . Breast enhancement surgery    . Vena cava filter placement      and removal   Family History  Problem Relation Age of Onset  . Cancer Mother   . Aortic aneurysm Father    Social History  Substance Use Topics  . Smoking status: Former Smoker    Quit date: 02/22/2011  . Smokeless tobacco: Never Used     Comment:  Quit 2013  . Alcohol Use: No   OB History    No data available     Review of Systems  Ten systems are reviewed and are negative for acute change except as noted in the HPI  Allergies  Acetaminophen; Fish allergy; Hydromorphone; Migraine formula ; Ondansetron; Tylenol; and Duloxetine hcl  Home Medications   Prior to Admission medications   Medication Sig Start Date End Date Taking? Authorizing Provider  albuterol (PROAIR HFA) 108 (90 BASE) MCG/ACT inhaler Inhale 2 puffs into the lungs every 6 (six) hours as needed for wheezing or shortness of breath. 12/29/14   Sheliah Hatch, MD  amitriptyline (ELAVIL) 25 MG tablet TAKE 1 TABLET(25 MG) BY MOUTH AT BEDTIME 01/26/15   Drema Dallas, DO  Ascorbic Acid (VITAMIN C) 1000 MG tablet Take 1,000 mg by mouth daily.    Historical Provider, MD  aspirin 325 MG tablet Take 325 mg by mouth daily.    Historical Provider, MD  atorvastatin (LIPITOR) 20 MG tablet TAKE ONE TABLET BY MOUTH ONCE DAILY 06/22/15   Waldon Merl, PA-C  cetirizine (ZYRTEC) 10 MG tablet Take 1 tablet (10 mg total) by mouth daily. 06/30/15   Waldon Merl, PA-C  cyclobenzaprine (FLEXERIL) 10 MG tablet Take 1 tablet (10 mg total) by mouth 3 (three) times daily as needed for muscle spasms.  05/27/15   Waldon Merl, PA-C  fluticasone-salmeterol (ADVAIR HFA) [phone] MCG/ACT inhaler Inhale 2 puffs into the lungs 2 (two) times daily. 01/27/15   Waldon Merl, PA-C  gabapentin (NEURONTIN) 300 MG capsule Take 1 capsule (300 mg total) by mouth 2 (two) times daily. 12/22/14   Sheliah Hatch, MD  LORazepam (ATIVAN) 0.5 MG tablet Take 1 tablet (0.5 mg total) by mouth 2 (two) times daily as needed for anxiety. 06/03/15   Ramon Dredge Saguier, PA-C  omeprazole (PRILOSEC) 20 MG capsule TAKE 1 CAPSULE(20 MG) BY MOUTH TWICE DAILY BEFORE A MEAL 11/25/14   Sheliah Hatch, MD  predniSONE (DELTASONE) 20 MG tablet 3 tab po day 1, 2 tabs po day 2, then 1 tab po day 3 06/03/15   Esperanza Richters, PA-C   promethazine (PHENERGAN) 12.5 MG suppository Place 1 suppository (12.5 mg total) rectally every 6 (six) hours as needed for nausea or vomiting. 10/28/14   Levie Heritage, DO  promethazine (PHENERGAN) 25 MG tablet TAKE 1 TABLET BY MOUTH EVERY 6 HOURS AS NEEDED FOR NAUSEA AND VOMITING 01/05/15   Sheliah Hatch, MD  venlafaxine XR (EFFEXOR-XR) 150 MG 24 hr capsule TAKE 1 CAPSULE(150 MG) BY MOUTH DAILY WITH BREAKFAST 03/23/15   Waldon Merl, PA-C   BP 120/52 mmHg  Pulse 113  Temp(Src) 98 F (36.7 C) (Oral)  Resp 16  SpO2 100% Physical Exam  Constitutional: She is oriented to person, place, and time. She appears well-developed and well-nourished. No distress.  HENT:  Head: Normocephalic and atraumatic.  Mouth/Throat: Oropharynx is clear and moist. No oropharyngeal exudate.  Eyes: Conjunctivae are normal. Pupils are equal, round, and reactive to light. Right eye exhibits no discharge. Left eye exhibits no discharge. No scleral icterus.  Neck: No tracheal deviation present.  Cardiovascular: Regular rhythm, normal heart sounds and intact distal pulses.  Exam reveals no gallop and no friction rub.   No murmur heard. Tachycardia (chronic per patient)  Pulmonary/Chest: Effort normal and breath sounds normal. No respiratory distress. She has no wheezes. She has no rales. She exhibits no tenderness.  Abdominal: Soft. Bowel sounds are normal. She exhibits no distension and no mass. There is no tenderness. There is no rebound and no guarding.  Musculoskeletal: She exhibits no edema.  Lymphadenopathy:    She has no cervical adenopathy.  Neurological: She is alert and oriented to person, place, and time. Coordination normal.  Normal right finger to nose, right pronator drift, right rapid alternating movements. Left upper extremity hemiparesis (chronic). Left sided facial droop (chronic). Hyperesthesias of face on left lower mandible. Sensation otherwise unremarkable. Strength 5/5 throughout BL LE   Skin: Skin is warm and dry. No rash noted. She is not diaphoretic. No erythema.  Psychiatric: She has a normal mood and affect. Her behavior is normal.  Nursing note and vitals reviewed.   ED Course  Procedures  Labs Review Labs Reviewed  CBC  BASIC METABOLIC PANEL   Imaging Review Ct Head Wo Contrast  07/07/2015  CLINICAL DATA:  Left temporal headaches EXAM: CT HEAD WITHOUT CONTRAST TECHNIQUE: Contiguous axial images were obtained from the base of the skull through the vertex without intravenous contrast. COMPARISON:  05/14/2014. FINDINGS: Bony calvarium is intact. There are changes consistent with encephalomalacia in the right temporal and parietal lobes consistent with prior right middle cerebral artery infarct. Compensatory dilatation of the right lateral ventricle is noted. No findings to suggest acute hemorrhage, acute infarction or space-occupying mass lesion are noted. IMPRESSION: Prior  right middle cerebral artery infarct stable from the prior exam. No acute abnormality is noted. Electronically Signed   By: Alcide CleverMark  Lukens M.D.   On: 07/07/2015 18:39   I have personally reviewed and evaluated these images and lab results as part of my medical decision-making.  MDM   Final diagnoses:  Headache, unspecified headache type  History of stroke   Dr. Radford PaxBeaton advised holding off on code stroke and neuro consult until head CT performed.  Head CT unremarkable. Neuro consulted- plan is to treat HA with migraine cocktail and reassess. If patient has pain, will consult neuro again.  Pt HA treated and improved while in ED.  Presentation is like pts typical HA and non concerning for [facility], ICH, Meningitis, or temporal arteritis. Pt is afebrile with no focal neuro deficits, nuchal rigidity, or change in vision. Pt is to follow up with neuro to discuss prophylactic medication. Pt verbalizes understanding and is agreeable with plan to dc.    Melton KrebsSamantha Nicole Jeromy Borcherding, PA-C 07/11/15 1644  Nelva Nayobert  Beaton, MD 07/31/15 [phone]

## 2015-07-07 NOTE — ED Notes (Addendum)
[facility]- pt here for sudden onset of headache starting at 1500. She has hx of migraines and stroke with left side weakness. Vital signs stable. Pt alert and oriented X4 with EMS, no new neuro deficits noted today.

## 2015-07-07 NOTE — ED Notes (Signed)
Patient transported to CT 

## 2015-07-07 NOTE — ED Notes (Signed)
This RN attempted access x 1, upon patient's request, IV team consult placed.

## 2015-07-07 NOTE — ED Notes (Signed)
PA Victory DakinRiley advised this RN that a code stroke does not need to be called at this time.

## 2015-07-07 NOTE — ED Notes (Signed)
[facility], PA at bedside to see patient.

## 2015-07-09 ENCOUNTER — Telehealth: Payer: Self-pay

## 2015-07-09 NOTE — Telephone Encounter (Signed)
Called [employer] @ [phone], Spoke with Mae L. Who informed me that CPT codes [id number], [phone], [phone], [phone], nor the medication Botox A require prior authorization. Call reference number is [id number]

## 2015-07-10 ENCOUNTER — Ambulatory Visit (INDEPENDENT_AMBULATORY_CARE_PROVIDER_SITE_OTHER): Payer: Medicare Other | Admitting: Neurology

## 2015-07-10 DIAGNOSIS — G8114 Spastic hemiplegia affecting left nondominant side: Secondary | ICD-10-CM | POA: Diagnosis not present

## 2015-07-10 DIAGNOSIS — G43709 Chronic migraine without aura, not intractable, without status migrainosus: Secondary | ICD-10-CM | POA: Diagnosis not present

## 2015-07-10 DIAGNOSIS — G43009 Migraine without aura, not intractable, without status migrainosus: Secondary | ICD-10-CM | POA: Diagnosis not present

## 2015-07-10 MED ORDER — ONABOTULINUMTOXINA 100 UNITS IJ SOLR
200.0000 [IU] | Freq: Once | INTRAMUSCULAR | Status: AC
  Administered 2015-07-10: 200 [IU] via INTRAMUSCULAR

## 2015-07-10 NOTE — Procedures (Signed)
Procedure Note:  BOTOX  Attending: Dr. Shon MilletAdam Roderic Lammert  I.  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  Risk discussed included, but were not limited pain and discomfort, bleeding, bruising, excessive weakness, venous thrombosis, muscle atrophy and dysphagia.  A copy of the patient medication guide was given to the patient which explains the blackbox warning.  Patients identity and treatment sites confirmed Yes.  .  Details of Procedure: Skin was cleaned with alcohol.  A hollow monopolor needle was introduced to the target muscle.  Prior to injection, the needle plunger was aspirated to make sure the needle was not within a blood vessel.  There was no blood retrieved on aspiration.    Following is a summary of the muscles injected  And the amount of Botulinum toxin used:   Dilution 0.9% preservative free saline mixed with 100 u Botox type A to make 10 U per 0.1cc  Injections   Left flexor digitorum profundus 50 units (total of 3 sites) Left flexor digitorum superficialis 50 units (total of 3 sites) Left flexor carpi ulnaris 25 units (total of 1 site) Left flexor carpi radialis 25 units (total of 1 site) TOTAL UNITS:  150 units  Agent: Botulinum Type A ( Onobotulinum Toxin type A ).  2 vials of Botox were used, each containing 100 units and freshly diluted with 1 mL of sterile, non-preserved saline              Total injected (Units): 150 units             Total wasted (Units): 50 units   Pt tolerated procedure well without complications.    Reinjection is anticipated in 3 months.   II.  Preoperative Diagnosis(es): Chronic migraine  Consent obtained from: The patient Benefits discussed included, but were not limited to decreased muscle tightness, increased joint range of motion, and  decreased pain.  Risk discussed included, but were not limited pain and discomfort, bleeding, bruising, excessive weakness, venous thrombosis, muscle atrophy and dysphagia.  A copy of the patient medication guide was given to the patient which explains the blackbox warning.  Patients identity and treatment sites confirmed Yes.  .  Details of Procedure: Skin was cleaned with alcohol.  Prior to injection, the needle plunger was aspirated to make sure the needle was not within a blood vessel.  There was no blood retrieved on aspiration.    Following is a summary of the muscles injected  And the amount of Botulinum toxin used:  Dilution 200 units were reconstituted with 4 mL 0.9% sodium chloride.  Skin was cleaned with alcohol prior to injection.  A 30-gauge, 0.5-inch needle was used.  Total:  4 mL (200 units)  # of injection sites       Muscle                         Dose Total 1                                  Corrugator                   0.1 mL (5 units) 1  Procerus                     0.2 mL (10 units) 2 each side                 Frontalis                      0.4 mL (20 units) 4 each side                 Temporalis                  0.8 mL (40 units) 3 each side                 Occipitalis                   0.6 mL (30 units) 2 each side                 Cervical paraspinals   0.4 mL (20 units) 3 each side                 Trapezius                    0.6 mL (30 units)                          Total 31                       Total    used                155 units                                     Total wasted               45 units                                       Pt tolerated procedure well without complications.    Reinjection is anticipated in 3 months.  Xiong Haidar R. Everlena Cooper, DO

## 2015-07-10 NOTE — Addendum Note (Signed)
Addended by: Sheilah MinsFOX, JADA A on: 07/10/2015 12:21 PM   Modules accepted: Orders

## 2015-07-10 NOTE — Progress Notes (Signed)
See procedure note.

## 2015-07-27 ENCOUNTER — Ambulatory Visit (INDEPENDENT_AMBULATORY_CARE_PROVIDER_SITE_OTHER): Payer: Medicare Other | Admitting: Physician Assistant

## 2015-07-27 ENCOUNTER — Telehealth: Payer: Self-pay | Admitting: *Deleted

## 2015-07-27 ENCOUNTER — Encounter: Payer: Self-pay | Admitting: Physician Assistant

## 2015-07-27 VITALS — BP 99/67 | HR 105 | Temp 98.0°F | Resp 16 | Ht 61.0 in | Wt 178.5 lb

## 2015-07-27 DIAGNOSIS — F419 Anxiety disorder, unspecified: Secondary | ICD-10-CM | POA: Diagnosis not present

## 2015-07-27 MED ORDER — LORAZEPAM 0.5 MG PO TABS: 0.5000 mg | ORAL_TABLET | Freq: Two times a day (BID) | ORAL | Status: DC | PRN

## 2015-07-27 MED ORDER — VENLAFAXINE HCL ER 150 MG PO CP24: ORAL_CAPSULE | ORAL | Status: DC

## 2015-07-27 NOTE — Patient Instructions (Signed)
Please continue the Ativan as directed. Continue all other chronic medications as directed.  Follow-up in 6 months.

## 2015-07-27 NOTE — Progress Notes (Signed)
Patient presents to clinic today for follow-up of anxiety. At last visit Ativan was started. She is taking 1-2 times daily as directed. Endorses marked improvement in anxiety with current regimen. Is taking other medications as directed.  Past Medical History  Diagnosis Date  . Migraine   . High cholesterol   . Depression   . Anxiety   . Dysarthria     Left arm  . History of stroke     a. 04/2014 MRI Brain: mod large chronic right MCA territory, chronic right cervical ICA dissection, smooth irregularity of the distal L cervical ICA consistent w/ underlying FMD, occlusion of multiple R MCA branch vessels in area of chronic infarct.   . Asthma   . Fibromuscular dysplasia ([place])   . Left hemiparesis ([place])   . Chest pain     a. 10/2014 CTA Chest: no dissection.    Current Outpatient Prescriptions on File Prior to Visit  Medication Sig Dispense Refill  . albuterol (PROAIR HFA) 108 (90 BASE) MCG/ACT inhaler Inhale 2 puffs into the lungs every 6 (six) hours as needed for wheezing or shortness of breath. 1 Inhaler 12  . amitriptyline (ELAVIL) 25 MG tablet TAKE 1 TABLET(25 MG) BY MOUTH AT BEDTIME 30 tablet 3  . Ascorbic Acid (VITAMIN C) 1000 MG tablet Take 1,000 mg by mouth daily. Reported on 07/27/2015    . aspirin 325 MG tablet Take 325 mg by mouth daily.    Marland Kitchen atorvastatin (LIPITOR) 20 MG tablet TAKE ONE TABLET BY MOUTH ONCE DAILY 30 tablet 5  . cetirizine (ZYRTEC) 10 MG tablet Take 1 tablet (10 mg total) by mouth daily. 30 tablet 5  . cyclobenzaprine (FLEXERIL) 10 MG tablet Take 1 tablet (10 mg total) by mouth 3 (three) times daily as needed for muscle spasms. 90 tablet 1  . fluticasone-salmeterol (ADVAIR HFA) 115-21 MCG/ACT inhaler Inhale 2 puffs into the lungs 2 (two) times daily. 1 Inhaler 12  . gabapentin (NEURONTIN) 300 MG capsule Take 1 capsule (300 mg total) by mouth 2 (two) times daily. 60 capsule 6  . LORazepam (ATIVAN) 0.5 MG tablet Take 1 tablet (0.5 mg total) by mouth 2 (two) times  daily as needed for anxiety. 30 tablet 0  . omeprazole (PRILOSEC) 20 MG capsule TAKE 1 CAPSULE(20 MG) BY MOUTH TWICE DAILY BEFORE A MEAL (Patient taking differently: TAKE 1 CAPSULE(20 MG) BY MOUTH  DAILY BEFORE A MEAL) 60 capsule 6  . promethazine (PHENERGAN) 12.5 MG suppository Place 1 suppository (12.5 mg total) rectally every 6 (six) hours as needed for nausea or vomiting. 12 each 0  . promethazine (PHENERGAN) 25 MG tablet TAKE 1 TABLET BY MOUTH EVERY 6 HOURS AS NEEDED FOR NAUSEA AND VOMITING 30 tablet 3  . venlafaxine XR (EFFEXOR-XR) 150 MG 24 hr capsule TAKE 1 CAPSULE(150 MG) BY MOUTH DAILY WITH BREAKFAST 30 capsule 5   No current facility-administered medications on file prior to visit.    Allergies  Allergen Reactions  . Duloxetine Hcl Rash    hallucinations  . Acetaminophen Hives  . Fish Allergy     Hives and migraines  . Hydromorphone Other (See Comments)    Could not speak or keep her eyes open  . Ondansetron Nausea And Vomiting  . Tylenol [Acetaminophen] Hives    Family History  Problem Relation Age of Onset  . Cancer Mother   . Aortic aneurysm Father     Social History   Social History  . Marital Status: Married    Spouse  Name: Courtney Garza   . Number of Children: 3  . Years of Education: 10 th   Occupational History  .      Disabled   Social History Main Topics  . Smoking status: Former Smoker    Quit date: 02/22/2011  . Smokeless tobacco: Never Used     Comment: Quit 2013  . Alcohol Use: No  . Drug Use: Yes    Special: Marijuana  . Sexual Activity: Not Asked   Other Topics Concern  . None   Social History Narrative   Patient lives at home with her husband Courtney Garza.   Disabled.   Education 10 th   Right handed.   Caffeine six cup of coffee daily.      Review of Systems - See HPI.  All other ROS are negative.  BP 99/67 mmHg  Pulse 105  Temp(Src) 98 F (36.7 C) (Oral)  Resp 16  Ht 5' 1"  (1.549 m)  Wt 178 lb 8 oz (80.967 kg)  BMI 33.74 kg/m2   SpO2 100%  Physical Exam  Constitutional: She is oriented to person, place, and time and well-developed, well-nourished, and in no distress.  HENT:  Head: Normocephalic and atraumatic.  Cardiovascular: Normal rate, regular rhythm, normal heart sounds and intact distal pulses.   Pulmonary/Chest: Effort normal and breath sounds normal. No respiratory distress. She has no wheezes. She has no rales. She exhibits no tenderness.  Neurological: She is alert and oriented to person, place, and time.  Skin: Skin is warm and dry. No rash noted.  Psychiatric: Affect normal.  Vitals reviewed.   Recent Results (from the past 2160 hour(s))  Vitamin D (25 hydroxy)     Status: None   Collection Time: 06/03/15 11:20 AM  Result Value Ref Range   VITD 53.66 30.00 - 100.00 ng/mL  CBC     Status: None   Collection Time: 07/07/15  6:18 PM  Result Value Ref Range   WBC 7.6 4.0 - 10.5 K/uL   RBC 4.44 3.87 - 5.11 MIL/uL   Hemoglobin 13.4 12.0 - 15.0 g/dL   HCT 42.6 36.0 - 46.0 %   MCV 95.9 78.0 - 100.0 fL   MCH 30.2 26.0 - 34.0 pg   MCHC 31.5 30.0 - 36.0 g/dL   RDW 13.5 11.5 - 15.5 %   Platelets 294 150 - 400 K/uL  Basic metabolic panel     Status: Abnormal   Collection Time: 07/07/15  6:18 PM  Result Value Ref Range   Sodium 141 135 - 145 mmol/L   Potassium 3.2 (L) 3.5 - 5.1 mmol/L   Chloride 105 101 - 111 mmol/L   CO2 24 22 - 32 mmol/L   Glucose, Bld 99 65 - 99 mg/dL   BUN <5 (L) 6 - 20 mg/dL   Creatinine, Ser 0.74 0.44 - 1.00 mg/dL   Calcium 9.5 8.9 - 10.3 mg/dL   GFR calc non Af Amer >60 >60 mL/min   GFR calc Af Amer >60 >60 mL/min    Comment: (NOTE) The eGFR has been calculated using the CKD EPI equation. This calculation has not been validated in all clinical situations. eGFR's persistently <60 mL/min signify possible Chronic Kidney Disease.    Anion gap 12 5 - 15    Assessment/Plan: 1. Anxiety Doing well. Will continue Ativan in addition to chronic medication regimen. CSC on file.  UDS up-to-date. FU 6 months.

## 2015-07-27 NOTE — Telephone Encounter (Signed)
Med Comments: Order phoned in to [employer] at [phone], Pt ID [id number], Order Confirmation [id number]/SLS 06/05

## 2015-07-27 NOTE — Progress Notes (Signed)
Pre visit review using our clinic review tool, if applicable. No additional management support is needed unless otherwise documented below in the visit note/SLS  

## 2015-07-31 ENCOUNTER — Telehealth: Payer: Self-pay | Admitting: *Deleted

## 2015-07-31 NOTE — Telephone Encounter (Signed)
Pt's spouse informed that patient's Effexor via [employer] patient Assistance arrived at office [while informing her that hers was in also] and can be p/u during regular business hours; understood & agreed/SLS 06/09

## 2015-08-19 ENCOUNTER — Encounter: Payer: Self-pay | Admitting: Neurology

## 2015-09-01 ENCOUNTER — Encounter: Payer: Self-pay | Admitting: Physician Assistant

## 2015-09-01 ENCOUNTER — Ambulatory Visit (INDEPENDENT_AMBULATORY_CARE_PROVIDER_SITE_OTHER): Payer: Medicare Other | Admitting: Physician Assistant

## 2015-09-01 VITALS — BP 115/73 | HR 116 | Temp 98.1°F | Resp 16 | Ht 61.0 in | Wt 177.4 lb

## 2015-09-01 DIAGNOSIS — J029 Acute pharyngitis, unspecified: Secondary | ICD-10-CM

## 2015-09-01 NOTE — Progress Notes (Signed)
Patient presents to clinic today c/o 4 days of sore throat with PND and rare dry cough. Denies fever, chills, recent travel or sick contact. Denies nasal congestion, sinus pressure, sinus pain, ear pain, chest congestion. Has not taken anything for symptoms. Does have history of seasonal allergies.  Past Medical History  Diagnosis Date  . Migraine   . High cholesterol   . Depression   . Anxiety   . Dysarthria     Left arm  . History of stroke     a. 04/2014 MRI Brain: mod large chronic right MCA territory, chronic right cervical ICA dissection, smooth irregularity of the distal L cervical ICA consistent w/ underlying FMD, occlusion of multiple R MCA branch vessels in area of chronic infarct.   . Asthma   . Fibromuscular dysplasia ([place])   . Left hemiparesis ([place])   . Chest pain     a. 10/2014 CTA Chest: no dissection.    Current Outpatient Prescriptions on File Prior to Visit  Medication Sig Dispense Refill  . albuterol (PROAIR HFA) 108 (90 BASE) MCG/ACT inhaler Inhale 2 puffs into the lungs every 6 (six) hours as needed for wheezing or shortness of breath. 1 Inhaler 12  . amitriptyline (ELAVIL) 25 MG tablet TAKE 1 TABLET(25 MG) BY MOUTH AT BEDTIME 30 tablet 3  . Ascorbic Acid (VITAMIN C) 1000 MG tablet Take 1,000 mg by mouth daily. Reported on 07/27/2015    . aspirin 325 MG tablet Take 325 mg by mouth daily.    Marland Kitchen atorvastatin (LIPITOR) 20 MG tablet TAKE ONE TABLET BY MOUTH ONCE DAILY 30 tablet 5  . cetirizine (ZYRTEC) 10 MG tablet Take 1 tablet (10 mg total) by mouth daily. 30 tablet 5  . cyclobenzaprine (FLEXERIL) 10 MG tablet Take 1 tablet (10 mg total) by mouth 3 (three) times daily as needed for muscle spasms. 90 tablet 1  . fluticasone-salmeterol (ADVAIR HFA) 115-21 MCG/ACT inhaler Inhale 2 puffs into the lungs 2 (two) times daily. 1 Inhaler 12  . gabapentin (NEURONTIN) 300 MG capsule Take 1 capsule (300 mg total) by mouth 2 (two) times daily. 60 capsule 6  . LORazepam (ATIVAN)  0.5 MG tablet Take 1 tablet (0.5 mg total) by mouth 2 (two) times daily as needed for anxiety. 60 tablet 1  . omeprazole (PRILOSEC) 20 MG capsule TAKE 1 CAPSULE(20 MG) BY MOUTH TWICE DAILY BEFORE A MEAL (Patient taking differently: TAKE 1 CAPSULE(20 MG) BY MOUTH  DAILY BEFORE A MEAL) 60 capsule 6  . promethazine (PHENERGAN) 12.5 MG suppository Place 1 suppository (12.5 mg total) rectally every 6 (six) hours as needed for nausea or vomiting. 12 each 0  . promethazine (PHENERGAN) 25 MG tablet TAKE 1 TABLET BY MOUTH EVERY 6 HOURS AS NEEDED FOR NAUSEA AND VOMITING 30 tablet 3  . venlafaxine XR (EFFEXOR-XR) 150 MG 24 hr capsule TAKE 1 CAPSULE(150 MG) BY MOUTH DAILY WITH BREAKFAST 30 capsule 5   No current facility-administered medications on file prior to visit.    Allergies  Allergen Reactions  . Duloxetine Hcl Rash    hallucinations  . Acetaminophen Hives  . Fish Allergy     Hives and migraines  . Hydromorphone Other (See Comments)    Could not speak or keep her eyes open  . Ondansetron Nausea And Vomiting  . Tylenol [Acetaminophen] Hives    Family History  Problem Relation Age of Onset  . Cancer Mother   . Aortic aneurysm Father     Social History  Social History  . Marital Status: Married    Spouse Name: Marya Amsler   . Number of Children: 3  . Years of Education: 10 th   Occupational History  .      Disabled   Social History Main Topics  . Smoking status: Former Smoker    Quit date: 02/22/2011  . Smokeless tobacco: Never Used     Comment: Quit 2013  . Alcohol Use: No  . Drug Use: Yes    Special: Marijuana  . Sexual Activity: Not Asked   Other Topics Concern  . None   Social History Narrative   Patient lives at home with her husband Belenda Cruise.   Disabled.   Education 10 th   Right handed.   Caffeine six cup of coffee daily.     Review of Systems - See HPI.  All other ROS are negative.  BP 115/73 mmHg  Pulse 116  Temp(Src) 98.1 F (36.7 C) (Oral)  Resp 16  Ht  5' 1"  (1.549 m)  Wt 177 lb 6 oz (80.457 kg)  BMI 33.53 kg/m2  SpO2 97%  Physical Exam  Constitutional: She is oriented to person, place, and time and well-developed, well-nourished, and in no distress.  HENT:  Head: Normocephalic and atraumatic.  Right Ear: Tympanic membrane normal.  Left Ear: Tympanic membrane normal.  Nose: Nose normal.  Mouth/Throat: Uvula is midline and mucous membranes are normal. Posterior oropharyngeal erythema present. No oropharyngeal exudate, posterior oropharyngeal edema or tonsillar abscesses.  Eyes: Conjunctivae are normal.  Cardiovascular: Normal rate, regular rhythm, normal heart sounds and intact distal pulses.   Pulmonary/Chest: Effort normal and breath sounds normal. No respiratory distress. She has no wheezes. She has no rales. She exhibits no tenderness.  Neurological: She is alert and oriented to person, place, and time.  Skin: Skin is warm and dry. No rash noted.  Psychiatric: Affect normal.  Vitals reviewed.   Recent Results (from the past 2160 hour(s))  CBC     Status: None   Collection Time: 07/07/15  6:18 PM  Result Value Ref Range   WBC 7.6 4.0 - 10.5 K/uL   RBC 4.44 3.87 - 5.11 MIL/uL   Hemoglobin 13.4 12.0 - 15.0 g/dL   HCT 42.6 36.0 - 46.0 %   MCV 95.9 78.0 - 100.0 fL   MCH 30.2 26.0 - 34.0 pg   MCHC 31.5 30.0 - 36.0 g/dL   RDW 13.5 11.5 - 15.5 %   Platelets 294 150 - 400 K/uL  Basic metabolic panel     Status: Abnormal   Collection Time: 07/07/15  6:18 PM  Result Value Ref Range   Sodium 141 135 - 145 mmol/L   Potassium 3.2 (L) 3.5 - 5.1 mmol/L   Chloride 105 101 - 111 mmol/L   CO2 24 22 - 32 mmol/L   Glucose, Bld 99 65 - 99 mg/dL   BUN <5 (L) 6 - 20 mg/dL   Creatinine, Ser 0.74 0.44 - 1.00 mg/dL   Calcium 9.5 8.9 - 10.3 mg/dL   GFR calc non Af Amer >60 >60 mL/min   GFR calc Af Amer >60 >60 mL/min    Comment: (NOTE) The eGFR has been calculated using the CKD EPI equation. This calculation has not been validated in all  clinical situations. eGFR's persistently <60 mL/min signify possible Chronic Kidney Disease.    Anion gap 12 5 - 15    Assessment/Plan: 1. Sore throat Rapid strep negative. Will send for culture. Suspect viral URI. Supportive  measures and OTC medications reviewed with patient. Will alter regimen based on results.   Leeanne Rio, PA-C

## 2015-09-01 NOTE — Patient Instructions (Signed)
Please stay well hydrated. Place a humidifier in the bedroom.  Use chloraseptic spray to help with throat irritation. Salt-water gargles will be beneficial.  Consider trying saline nasal spray to flush out nasal passages and to cut down on any post-nasal drip. You can also start OTC Flonase spray.  Follow-up if symptoms are not resolving. We will call you with throat culture results.

## 2015-09-03 LAB — CULTURE, GROUP A STREP: Organism ID, Bacteria: NORMAL

## 2015-09-22 ENCOUNTER — Encounter: Payer: Self-pay | Admitting: Physician Assistant

## 2015-09-22 ENCOUNTER — Ambulatory Visit (INDEPENDENT_AMBULATORY_CARE_PROVIDER_SITE_OTHER): Payer: Medicare Other | Admitting: Physician Assistant

## 2015-09-22 VITALS — BP 106/72 | HR 110 | Temp 98.2°F | Resp 16 | Ht 61.0 in | Wt 181.2 lb

## 2015-09-22 DIAGNOSIS — R1013 Epigastric pain: Secondary | ICD-10-CM | POA: Diagnosis not present

## 2015-09-22 LAB — CBC WITH DIFFERENTIAL/PLATELET
Basophils Absolute: 0 10*3/uL (ref 0.0–0.1)
Basophils Relative: 0.5 % (ref 0.0–3.0)
Eosinophils Absolute: 0.2 10*3/uL (ref 0.0–0.7)
Eosinophils Relative: 2.3 % (ref 0.0–5.0)
HCT: 37.6 % (ref 36.0–46.0)
Hemoglobin: 12.5 g/dL (ref 12.0–15.0)
Lymphocytes Relative: 38 % (ref 12.0–46.0)
Lymphs Abs: 2.7 10*3/uL (ref 0.7–4.0)
MCHC: 33.3 g/dL (ref 30.0–36.0)
MCV: 91.9 fl (ref 78.0–100.0)
Monocytes Absolute: 0.4 10*3/uL (ref 0.1–1.0)
Monocytes Relative: 6 % (ref 3.0–12.0)
Neutro Abs: 3.8 10*3/uL (ref 1.4–7.7)
Neutrophils Relative %: 53.2 % (ref 43.0–77.0)
Platelets: 265 10*3/uL (ref 150.0–400.0)
RBC: 4.09 Mil/uL (ref 3.87–5.11)
RDW: 14.5 % (ref 11.5–15.5)
WBC: 7.1 10*3/uL (ref 4.0–10.5)

## 2015-09-22 LAB — COMPREHENSIVE METABOLIC PANEL
ALT: 10 U/L (ref 0–35)
AST: 17 U/L (ref 0–37)
Albumin: 3.6 g/dL (ref 3.5–5.2)
Alkaline Phosphatase: 98 U/L (ref 39–117)
BUN: 4 mg/dL — ABNORMAL LOW (ref 6–23)
CO2: 28 mEq/L (ref 19–32)
Calcium: 9 mg/dL (ref 8.4–10.5)
Chloride: 102 mEq/L (ref 96–112)
Creatinine, Ser: 0.63 mg/dL (ref 0.40–1.20)
GFR: 108.25 mL/min (ref >60.00)
Glucose, Bld: 85 mg/dL (ref 70–99)
Potassium: 3.7 mEq/L (ref 3.5–5.1)
Sodium: 139 mEq/L (ref 135–145)
Total Bilirubin: 0.3 mg/dL (ref 0.2–1.2)
Total Protein: 6.8 g/dL (ref 6.0–8.3)

## 2015-09-22 LAB — LIPASE: Lipase: 38 U/L (ref 11.0–59.0)

## 2015-09-22 MED ORDER — LORAZEPAM 0.5 MG PO TABS: 0.5000 mg | ORAL_TABLET | Freq: Two times a day (BID) | ORAL | 1 refills | Status: DC | PRN

## 2015-09-22 NOTE — Progress Notes (Signed)
Patient presents to clinic today c/o 4 days of bilateral lower abdominal cramping associated with nausea. Endorses 2 days ago starting with loose stools, averaging about 2-3 bowl movements per day. Denies blood in the stool. Denies melena. Denies emesis. Denies fever or chills. Denies heartburn on indigestion. Denies urinary symptoms. Denies sick contact, recent travel or abnormal food/water source. Husband has had the same meals and has no symptoms.  Past Medical History:  Diagnosis Date  . Anxiety   . Asthma   . Chest pain    a. 10/2014 CTA Chest: no dissection.  . Depression   . Dysarthria    Left arm  . Fibromuscular dysplasia ([place])   . High cholesterol   . History of stroke    a. 04/2014 MRI Brain: mod large chronic right MCA territory, chronic right cervical ICA dissection, smooth irregularity of the distal L cervical ICA consistent w/ underlying FMD, occlusion of multiple R MCA branch vessels in area of chronic infarct.   . Left hemiparesis ([place])   . Migraine     Current Outpatient Prescriptions on File Prior to Visit  Medication Sig Dispense Refill  . albuterol (PROAIR HFA) 108 (90 BASE) MCG/ACT inhaler Inhale 2 puffs into the lungs every 6 (six) hours as needed for wheezing or shortness of breath. 1 Inhaler 12  . amitriptyline (ELAVIL) 25 MG tablet TAKE 1 TABLET(25 MG) BY MOUTH AT BEDTIME 30 tablet 3  . Ascorbic Acid (VITAMIN C) 1000 MG tablet Take 1,000 mg by mouth daily. Reported on 07/27/2015    . aspirin 325 MG tablet Take 325 mg by mouth daily.    Marland Kitchen atorvastatin (LIPITOR) 20 MG tablet TAKE ONE TABLET BY MOUTH ONCE DAILY 30 tablet 5  . cetirizine (ZYRTEC) 10 MG tablet Take 1 tablet (10 mg total) by mouth daily. 30 tablet 5  . cyclobenzaprine (FLEXERIL) 10 MG tablet Take 1 tablet (10 mg total) by mouth 3 (three) times daily as needed for muscle spasms. 90 tablet 1  . fluticasone-salmeterol (ADVAIR HFA) 115-21 MCG/ACT inhaler Inhale 2 puffs into the lungs 2 (two) times daily.  1 Inhaler 12  . gabapentin (NEURONTIN) 300 MG capsule Take 1 capsule (300 mg total) by mouth 2 (two) times daily. 60 capsule 6  . LORazepam (ATIVAN) 0.5 MG tablet Take 1 tablet (0.5 mg total) by mouth 2 (two) times daily as needed for anxiety. 60 tablet 1  . omeprazole (PRILOSEC) 20 MG capsule TAKE 1 CAPSULE(20 MG) BY MOUTH TWICE DAILY BEFORE A MEAL (Patient taking differently: TAKE 1 CAPSULE(20 MG) BY MOUTH  DAILY BEFORE A MEAL) 60 capsule 6  . promethazine (PHENERGAN) 12.5 MG suppository Place 1 suppository (12.5 mg total) rectally every 6 (six) hours as needed for nausea or vomiting. 12 each 0  . promethazine (PHENERGAN) 25 MG tablet TAKE 1 TABLET BY MOUTH EVERY 6 HOURS AS NEEDED FOR NAUSEA AND VOMITING 30 tablet 3  . venlafaxine XR (EFFEXOR-XR) 150 MG 24 hr capsule TAKE 1 CAPSULE(150 MG) BY MOUTH DAILY WITH BREAKFAST 30 capsule 5   No current facility-administered medications on file prior to visit.     Allergies  Allergen Reactions  . Duloxetine Hcl Rash    hallucinations  . Acetaminophen Hives  . Fish Allergy     Hives and migraines  . Hydromorphone Other (See Comments)    Could not speak or keep her eyes open  . Ondansetron Nausea And Vomiting  . Tylenol [Acetaminophen] Hives    Family History  Problem Relation  Age of Onset  . Cancer Mother   . Aortic aneurysm Father     Social History   Social History  . Marital status: Married    Spouse name: Marya Amsler   . Number of children: 3  . Years of education: 10 th   Occupational History  .      Disabled   Social History Main Topics  . Smoking status: Former Smoker    Quit date: 02/22/2011  . Smokeless tobacco: Never Used     Comment: Quit 2013  . Alcohol use No  . Drug use:     Types: Marijuana  . Sexual activity: Not Asked   Other Topics Concern  . None   Social History Narrative   Patient lives at home with her husband Belenda Cruise.   Disabled.   Education 10 th   Right handed.   Caffeine six cup of coffee daily.       Review of Systems - See HPI.  All other ROS are negative.  Ht 5' 1"  (1.549 m)   Wt 181 lb 4 oz (82.2 kg)   BMI 34.25 kg/m   Physical Exam  Constitutional: She is well-developed, well-nourished, and in no distress.  HENT:  Head: Normocephalic and atraumatic.  Eyes: Conjunctivae are normal.  Neck: Neck supple.  Cardiovascular: Normal rate, regular rhythm, normal heart sounds and intact distal pulses.   Pulmonary/Chest: Effort normal.  Abdominal: Soft. Bowel sounds are normal. She exhibits no distension and no mass. There is generalized tenderness. There is no rebound, no guarding and negative Murphy's sign.  Skin: Skin is warm and dry. No rash noted.  Psychiatric: Affect normal.  Vitals reviewed.   Recent Results (from the past 2160 hour(s))  CBC     Status: None   Collection Time: 07/07/15  6:18 PM  Result Value Ref Range   WBC 7.6 4.0 - 10.5 K/uL   RBC 4.44 3.87 - 5.11 MIL/uL   Hemoglobin 13.4 12.0 - 15.0 g/dL   HCT 42.6 36.0 - 46.0 %   MCV 95.9 78.0 - 100.0 fL   MCH 30.2 26.0 - 34.0 pg   MCHC 31.5 30.0 - 36.0 g/dL   RDW 13.5 11.5 - 15.5 %   Platelets 294 150 - 400 K/uL  Basic metabolic panel     Status: Abnormal   Collection Time: 07/07/15  6:18 PM  Result Value Ref Range   Sodium 141 135 - 145 mmol/L   Potassium 3.2 (L) 3.5 - 5.1 mmol/L   Chloride 105 101 - 111 mmol/L   CO2 24 22 - 32 mmol/L   Glucose, Bld 99 65 - 99 mg/dL   BUN <5 (L) 6 - 20 mg/dL   Creatinine, Ser 0.74 0.44 - 1.00 mg/dL   Calcium 9.5 8.9 - 10.3 mg/dL   GFR calc non Af Amer >60 >60 mL/min   GFR calc Af Amer >60 >60 mL/min    Comment: (NOTE) The eGFR has been calculated using the CKD EPI equation. This calculation has not been validated in all clinical situations. eGFR's persistently <60 mL/min signify possible Chronic Kidney Disease.    Anion gap 12 5 - 15  Culture, Group A Strep     Status: None   Collection Time: 09/01/15  4:46 PM  Result Value Ref Range   Organism ID, Bacteria Normal  Upper Respiratory Flora    Organism ID, Bacteria No Beta Hemolytic Streptococci Isolated    Assessment/Plan: 1. Abdominal pain, epigastric Suspect mild enteritis, but will need  to r/o mild pancreatitis. Will obtain lab panel today. Diet discussed. Handout given. Restart probiotic. Continue chronic GI medications as directed.  - CBC w/Diff - Comp Met (CMET) - Lipase   Leeanne Rio, PA-C

## 2015-09-22 NOTE — Progress Notes (Signed)
Pre visit review using our clinic review tool, if applicable. No additional management support is needed unless otherwise documented below in the visit note/SLS  

## 2015-09-22 NOTE — Patient Instructions (Signed)
Please go to the lab for blood work. I will call with your results.  Please stay hydrated. Eat a bland diet. Follow the diet below. Continue chronic medications. Restart a probiotic.  We will alter regimen based on results.   Food Choices to Help Relieve Diarrhea, Adult When you have diarrhea, the foods you eat and your eating habits are very important. Choosing the right foods and drinks can help relieve diarrhea. Also, because diarrhea can last up to 7 days, you need to replace lost fluids and electrolytes (such as sodium, potassium, and chloride) in order to help prevent dehydration.  WHAT GENERAL GUIDELINES DO I NEED TO FOLLOW?  Slowly drink 1 cup (8 oz) of fluid for each episode of diarrhea. If you are getting enough fluid, your urine will be clear or pale yellow.  Eat starchy foods. Some good choices include white rice, white toast, pasta, low-fiber cereal, baked potatoes (without the skin), saltine crackers, and bagels.  Avoid large servings of any cooked vegetables.  Limit fruit to two servings per day. A serving is  cup or 1 small piece.  Choose foods with less than 2 g of fiber per serving.  Limit fats to less than 8 tsp (38 g) per day.  Avoid fried foods.  Eat foods that have probiotics in them. Probiotics can be found in certain dairy products.  Avoid foods and beverages that may increase the speed at which food moves through the stomach and intestines (gastrointestinal tract). Things to avoid include:  High-fiber foods, such as dried fruit, raw fruits and vegetables, nuts, seeds, and whole grain foods.  Spicy foods and high-fat foods.  Foods and beverages sweetened with high-fructose corn syrup, honey, or sugar alcohols such as xylitol, sorbitol, and mannitol. WHAT FOODS ARE RECOMMENDED? Grains White rice. White, [place], or pita breads (fresh or toasted), including plain rolls, buns, or bagels. White pasta. Saltine, soda, or graham crackers. Pretzels. Low-fiber  cereal. Cooked cereals made with water (such as cornmeal, farina, or cream cereals). Plain muffins. Matzo. Melba toast. Zwieback.  Vegetables Potatoes (without the skin). Strained tomato and vegetable juices. Most well-cooked and canned vegetables without seeds. Tender lettuce. Fruits Cooked or canned applesauce, apricots, cherries, fruit cocktail, grapefruit, peaches, pears, or plums. Fresh bananas, apples without skin, cherries, grapes, cantaloupe, grapefruit, peaches, oranges, or plums.  Meat and Other Protein Products Baked or boiled chicken. Eggs. Tofu. Fish. Seafood. Smooth peanut butter. Ground or well-cooked tender beef, ham, veal, lamb, pork, or poultry.  Dairy Plain yogurt, kefir, and unsweetened liquid yogurt. Lactose-free milk, buttermilk, or soy milk. Plain hard cheese. Beverages Sport drinks. Clear broths. Diluted fruit juices (except prune). Regular, caffeine-free sodas such as ginger ale. Water. Decaffeinated teas. Oral rehydration solutions. Sugar-free beverages not sweetened with sugar alcohols. Other Bouillon, broth, or soups made from recommended foods.  The items listed above may not be a complete list of recommended foods or beverages. Contact your dietitian for more options. WHAT FOODS ARE NOT RECOMMENDED? Grains Whole grain, whole wheat, bran, or rye breads, rolls, pastas, crackers, and cereals. Wild or brown rice. Cereals that contain more than 2 g of fiber per serving. Corn tortillas or taco shells. Cooked or dry oatmeal. Granola. Popcorn. Vegetables Raw vegetables. Cabbage, broccoli, Brussels sprouts, artichokes, baked beans, beet greens, corn, kale, legumes, peas, sweet potatoes, and yams. Potato skins. Cooked spinach and cabbage. Fruits Dried fruit, including raisins and dates. Raw fruits. Stewed or dried prunes. Fresh apples with skin, apricots, mangoes, pears, raspberries, and strawberries.  Meat  and Other Protein Products Chunky peanut butter. Nuts and seeds.  Beans and lentils. Tomasa Blase.  Dairy High-fat cheeses. Milk, chocolate milk, and beverages made with milk, such as milk shakes. Cream. Ice cream. Sweets and Desserts Sweet rolls, doughnuts, and sweet breads. Pancakes and waffles. Fats and Oils Butter. Cream sauces. Margarine. Salad oils. Plain salad dressings. Olives. Avocados.  Beverages Caffeinated beverages (such as coffee, tea, soda, or energy drinks). Alcoholic beverages. Fruit juices with pulp. Prune juice. Soft drinks sweetened with high-fructose corn syrup or sugar alcohols. Other Coconut. Hot sauce. Chili powder. Mayonnaise. Gravy. Cream-based or milk-based soups.  The items listed above may not be a complete list of foods and beverages to avoid. Contact your dietitian for more information. WHAT SHOULD I DO IF I BECOME DEHYDRATED? Diarrhea can sometimes lead to dehydration. Signs of dehydration include dark urine and dry mouth and skin. If you think you are dehydrated, you should rehydrate with an oral rehydration solution. These solutions can be purchased at pharmacies, retail stores, or online.  Drink -1 cup (120-240 mL) of oral rehydration solution each time you have an episode of diarrhea. If drinking this amount makes your diarrhea worse, try drinking smaller amounts more often. For example, drink 1-3 tsp (5-15 mL) every 5-10 minutes.  A general rule for staying hydrated is to drink 1-2 L of fluid per day. Talk to your health care provider about the specific amount you should be drinking each day. Drink enough fluids to keep your urine clear or pale yellow.   This information is not intended to replace advice given to you by your health care provider. Make sure you discuss any questions you have with your health care provider.   Document Released: 04/30/2003 Document Revised: 02/28/2014 Document Reviewed: 12/31/2012 Elsevier Interactive Patient Education [employer]

## 2015-09-23 ENCOUNTER — Other Ambulatory Visit: Payer: Self-pay | Admitting: Physician Assistant

## 2015-10-02 ENCOUNTER — Ambulatory Visit (INDEPENDENT_AMBULATORY_CARE_PROVIDER_SITE_OTHER): Payer: Medicare Other | Admitting: Physician Assistant

## 2015-10-02 ENCOUNTER — Encounter: Payer: Self-pay | Admitting: Physician Assistant

## 2015-10-02 VITALS — BP 96/61 | HR 116 | Temp 98.2°F | Resp 16 | Ht 61.0 in | Wt 179.0 lb

## 2015-10-02 DIAGNOSIS — G8114 Spastic hemiplegia affecting left nondominant side: Secondary | ICD-10-CM

## 2015-10-02 DIAGNOSIS — Z0000 Encounter for general adult medical examination without abnormal findings: Secondary | ICD-10-CM

## 2015-10-02 DIAGNOSIS — E7800 Pure hypercholesterolemia, unspecified: Secondary | ICD-10-CM

## 2015-10-02 DIAGNOSIS — I773 Arterial fibromuscular dysplasia: Secondary | ICD-10-CM

## 2015-10-02 LAB — CBC
HCT: 40.5 % (ref 36.0–46.0)
Hemoglobin: 13.5 g/dL (ref 12.0–15.0)
MCHC: 33.2 g/dL (ref 30.0–36.0)
MCV: 93.1 fl (ref 78.0–100.0)
Platelets: 276 10*3/uL (ref 150.0–400.0)
RBC: 4.36 Mil/uL (ref 3.87–5.11)
RDW: 14.2 % (ref 11.5–15.5)
WBC: 6.6 10*3/uL (ref 4.0–10.5)

## 2015-10-02 LAB — COMPREHENSIVE METABOLIC PANEL
ALT: 16 U/L (ref 0–35)
AST: 23 U/L (ref 0–37)
Albumin: 3.8 g/dL (ref 3.5–5.2)
Alkaline Phosphatase: 95 U/L (ref 39–117)
BUN: 4 mg/dL — ABNORMAL LOW (ref 6–23)
CO2: 28 mEq/L (ref 19–32)
Calcium: 9.3 mg/dL (ref 8.4–10.5)
Chloride: 103 mEq/L (ref 96–112)
Creatinine, Ser: 0.71 mg/dL (ref 0.40–1.20)
GFR: 94.29 mL/min (ref >60.00)
Glucose, Bld: 91 mg/dL (ref 70–99)
Potassium: 3.7 mEq/L (ref 3.5–5.1)
Sodium: 138 mEq/L (ref 135–145)
Total Bilirubin: 0.3 mg/dL (ref 0.2–1.2)
Total Protein: 6.9 g/dL (ref 6.0–8.3)

## 2015-10-02 LAB — URINALYSIS, ROUTINE W REFLEX MICROSCOPIC
Bilirubin Urine: NEGATIVE
Hgb urine dipstick: NEGATIVE
Ketones, ur: NEGATIVE
Leukocytes, UA: NEGATIVE
Nitrite: NEGATIVE
RBC / HPF: NONE SEEN (ref 0–?)
Specific Gravity, Urine: <=1.005 — AB (ref 1.000–1.030)
Total Protein, Urine: NEGATIVE
Urine Glucose: NEGATIVE
Urobilinogen, UA: 0.2 (ref 0.0–1.0)
pH: 6 (ref 5.0–8.0)

## 2015-10-02 LAB — HEMOGLOBIN A1C: Hgb A1c MFr Bld: 5.6 % (ref 4.6–6.5)

## 2015-10-02 LAB — LIPID PANEL
Cholesterol: 158 mg/dL (ref 0–200)
HDL: 57.7 mg/dL (ref >39.00)
LDL Cholesterol: 72 mg/dL (ref 0–99)
NonHDL: 100.68
Total CHOL/HDL Ratio: 3
Triglycerides: 142 mg/dL (ref 0.0–149.0)
VLDL: 28.4 mg/dL (ref 0.0–40.0)

## 2015-10-02 LAB — TSH: TSH: 2.11 u[IU]/mL (ref 0.35–4.50)

## 2015-10-02 NOTE — Progress Notes (Signed)
Subjective:    Courtney Garza is a 45 y.o. female who presents for Medicare Annual/Subsequent preventive examination and physical examination.  Preventive Screening-Counseling & Management  Tobacco History  Smoking Status  . Former Smoker  . Quit date: 02/22/2011  Smokeless Tobacco  . Never Used    Comment: Quit 2013     Problems Prior to Visit Hyperlipidemia -- Currently on Lipitor 20 mg daily. Endorses taking daily as directed without side effect. Exercise is somewhat limited due to fibromuscular dystrophy and left spastic hemiparesis. Is working on diet, watching portion sizes and making healthier choices. Is fasting for labs today.  Fibromuscular Dystrophy -- Followed by Neurology. Is currently on combination of Gabapentin, Effexor, Elavil and Flexeril. Denies concerns today.  Current Problems (verified) Patient Active Problem List   Diagnosis Date Noted  . Osteopenia 02/25/2015  . Tachycardia 01/28/2015  . Abdominal cramping 10/15/2014  . H. pylori infection 10/15/2014  . Adnexal pain 08/19/2014  . Vitreous floaters of left eye 05/07/2014  . Back pain 04/10/2014  . Elevated alkaline phosphatase level 04/10/2014  . Asthma, mild persistent 11/13/2013  . Stroke (HCC) 10/29/2013  . Left spastic hemiparesis (HCC) 10/29/2013  . Fibromuscular dysplasia (HCC) 10/29/2013  . Migraine   . High cholesterol   . Depression   . Anxiety   . Dysarthria     Medications Prior to Visit Current Outpatient Prescriptions on File Prior to Visit  Medication Sig Dispense Refill  . albuterol (PROAIR HFA) 108 (90 BASE) MCG/ACT inhaler Inhale 2 puffs into the lungs every 6 (six) hours as needed for wheezing or shortness of breath. 1 Inhaler 12  . amitriptyline (ELAVIL) 25 MG tablet TAKE 1 TABLET(25 MG) BY MOUTH AT BEDTIME 30 tablet 3  . Ascorbic Acid (VITAMIN C) 1000 MG tablet Take 1,000 mg by mouth daily. Reported on 07/27/2015    . aspirin 325 MG tablet Take 325 mg by mouth daily.    Marland Kitchen  atorvastatin (LIPITOR) 20 MG tablet TAKE ONE TABLET BY MOUTH ONCE DAILY 30 tablet 5  . cetirizine (ZYRTEC) 10 MG tablet Take 1 tablet (10 mg total) by mouth daily. 30 tablet 5  . cyclobenzaprine (FLEXERIL) 10 MG tablet TAKE ONE TABLET BY MOUTH THREE TIMES DAILY AS NEEDED FOR MUSCLE SPASMS 90 tablet 0  . fluticasone-salmeterol (ADVAIR HFA) 115-21 MCG/ACT inhaler Inhale 2 puffs into the lungs 2 (two) times daily. 1 Inhaler 12  . gabapentin (NEURONTIN) 300 MG capsule Take 1 capsule (300 mg total) by mouth 2 (two) times daily. 60 capsule 6  . LORazepam (ATIVAN) 0.5 MG tablet Take 1 tablet (0.5 mg total) by mouth 2 (two) times daily as needed for anxiety. 60 tablet 1  . omeprazole (PRILOSEC) 20 MG capsule TAKE 1 CAPSULE(20 MG) BY MOUTH TWICE DAILY BEFORE A MEAL (Patient taking differently: TAKE 1 CAPSULE(20 MG) BY MOUTH  DAILY BEFORE A MEAL) 60 capsule 6  . promethazine (PHENERGAN) 12.5 MG suppository Place 1 suppository (12.5 mg total) rectally every 6 (six) hours as needed for nausea or vomiting. 12 each 0  . promethazine (PHENERGAN) 25 MG tablet TAKE 1 TABLET BY MOUTH EVERY 6 HOURS AS NEEDED FOR NAUSEA AND VOMITING 30 tablet 3  . venlafaxine XR (EFFEXOR-XR) 150 MG 24 hr capsule TAKE 1 CAPSULE(150 MG) BY MOUTH DAILY WITH BREAKFAST 30 capsule 5   No current facility-administered medications on file prior to visit.     Current Medications (verified) Current Outpatient Prescriptions  Medication Sig Dispense Refill  . albuterol (PROAIR HFA)  108 (90 BASE) MCG/ACT inhaler Inhale 2 puffs into the lungs every 6 (six) hours as needed for wheezing or shortness of breath. 1 Inhaler 12  . amitriptyline (ELAVIL) 25 MG tablet TAKE 1 TABLET(25 MG) BY MOUTH AT BEDTIME 30 tablet 3  . Ascorbic Acid (VITAMIN C) 1000 MG tablet Take 1,000 mg by mouth daily. Reported on 07/27/2015    . aspirin 325 MG tablet Take 325 mg by mouth daily.    Marland Kitchen atorvastatin (LIPITOR) 20 MG tablet TAKE ONE TABLET BY MOUTH ONCE DAILY 30 tablet 5   . cetirizine (ZYRTEC) 10 MG tablet Take 1 tablet (10 mg total) by mouth daily. 30 tablet 5  . cyclobenzaprine (FLEXERIL) 10 MG tablet TAKE ONE TABLET BY MOUTH THREE TIMES DAILY AS NEEDED FOR MUSCLE SPASMS 90 tablet 0  . fluticasone-salmeterol (ADVAIR HFA) 115-21 MCG/ACT inhaler Inhale 2 puffs into the lungs 2 (two) times daily. 1 Inhaler 12  . gabapentin (NEURONTIN) 300 MG capsule Take 1 capsule (300 mg total) by mouth 2 (two) times daily. 60 capsule 6  . LORazepam (ATIVAN) 0.5 MG tablet Take 1 tablet (0.5 mg total) by mouth 2 (two) times daily as needed for anxiety. 60 tablet 1  . omeprazole (PRILOSEC) 20 MG capsule TAKE 1 CAPSULE(20 MG) BY MOUTH TWICE DAILY BEFORE A MEAL (Patient taking differently: TAKE 1 CAPSULE(20 MG) BY MOUTH  DAILY BEFORE A MEAL) 60 capsule 6  . promethazine (PHENERGAN) 12.5 MG suppository Place 1 suppository (12.5 mg total) rectally every 6 (six) hours as needed for nausea or vomiting. 12 each 0  . promethazine (PHENERGAN) 25 MG tablet TAKE 1 TABLET BY MOUTH EVERY 6 HOURS AS NEEDED FOR NAUSEA AND VOMITING 30 tablet 3  . venlafaxine XR (EFFEXOR-XR) 150 MG 24 hr capsule TAKE 1 CAPSULE(150 MG) BY MOUTH DAILY WITH BREAKFAST 30 capsule 5   No current facility-administered medications for this visit.      Allergies (verified) Duloxetine hcl; Acetaminophen; Fish allergy; Hydromorphone; Ondansetron; and Tylenol [acetaminophen]   PAST HISTORY  Family History Family History  Problem Relation Age of Onset  . Cancer Mother   . Aortic aneurysm Father     Social History Social History  Substance Use Topics  . Smoking status: Former Smoker    Quit date: 02/22/2011  . Smokeless tobacco: Never Used     Comment: Quit 2013  . Alcohol use No    Are there smokers in your home (other than you)? No  Risk Factors Current exercise habits: Home exercise routine includes walking 1 hrs per day. Takes the dog for a walk daily as well. Dietary issues discussed: Body mass index is  33.82 kg/m. Endorses well-balanced diet overall. Has increased intake of vegetable. Does each mainly chicken for protein.    Cardiac risk factors: dyslipidemia.  Depression Screen (Note: if answer to either of the following is "Yes", a more complete depression screening is indicated)   Over the past two weeks, have you felt down, depressed or hopeless? No  Over the past two weeks, have you felt little interest or pleasure in doing things? No  Have you lost interest or pleasure in daily life? No  Do you often feel hopeless? No  Do you cry easily over simple problems? No  Activities of Daily Living In your present state of health, do you have any difficulty performing the following activities?:  Driving? Yes  - has never driven Managing money?  No Feeding yourself? No Getting from bed to chair?  Climbing a flight  of stairs? Yes Preparing food and eating?: No Bathing or showering? No Getting dressed: Yes Getting to the toilet? No Using the toilet:No Moving around from place to place: Yes In the past year have you fallen or had a near fall?:No  All yes responses are related to patient's fibromuscular dysplasia.    Are you sexually active?  Yes  Do you have more than one partner?  No  Hearing Difficulties: No Do you often ask people to speak up or repeat themselves? No Do you experience ringing or noises in your ears? Yes - chronic from her FMD Do you have difficulty understanding soft or whispered voices? No   Do you feel that you have a problem with memory? No  Do you often misplace items? No  Do you feel safe at home?  Yes  Cognitive Testing  Alert? Yes  Normal Appearance?Yes  Oriented to person? Yes  Place? Yes   Time? Yes  Recall of three objects?  Yes  Can perform simple calculations? Yes  Displays appropriate judgment?Yes  Can read the correct time from a watch face?Yes   Advanced Directives have been discussed with the patient? Yes  List the Names of Other  Physician/Practitioners you currently use: See EMR for comprehensive list of specialists.  Indicate any recent Medical Services you may have received from other than [facility] providers in the past year (date may be approximate).  Immunization History  Administered Date(s) Administered  . Influenza,inj,Quad PF,36+ Mos 11/14/2013, 11/06/2014    Screening Tests Health Maintenance  Topic Date Due  . DTaP/Tdap/Td (1 - Tdap) 12/23/1988  . TETANUS/TDAP  12/23/1988  . INFLUENZA VACCINE  09/22/2015  . HIV Screening  11/22/2015 (Originally 12/23/1984)  . MAMMOGRAM  01/27/2016  . PAP SMEAR  02/22/2016  . DEXA SCAN  01/26/2017    All answers were reviewed with the patient and necessary referrals were made:  Piedad Climes, PA-C   10/02/2015   History reviewed: allergies, current medications, past family history, past medical history, past social history, past surgical history and problem list  Review of Systems Pertinent items noted in HPI and remainder of comprehensive ROS otherwise negative.    Objective:   Body mass index is 33.82 kg/m. BP 96/61 (BP Location: Right Arm, Patient Position: Sitting, Cuff Size: Large)   Pulse (!) 116   Temp 98.2 F (36.8 C) (Oral)   Resp 16   Ht  (1.549 m)   Wt 179 lb (81.2 kg)   SpO2 97%   BMI 33.82 kg/m   General appearance: alert, cooperative, appears stated age and no distress Head: Normocephalic, without obvious abnormality, atraumatic Eyes: conjunctivae/corneas clear. PERRL, EOM's intact. Fundi benign. Ears: normal TM's and external ear canals both ears Nose: Nares normal. Septum midline. Mucosa normal. No drainage or sinus tenderness. Throat: lips, mucosa, and tongue normal; teeth and gums normal Lungs: clear to auscultation bilaterally Heart: regular rate and rhythm, S1, S2 normal, no murmur, click, rub or gallop Abdomen: soft, non-tender; bowel sounds normal; no masses,  no organomegaly Pulses: 2+ and symmetric Skin: Skin  color, texture, turgor normal. No rashes or lesions Neurologic: Grossly normal Strength 5/5 of R upper and lower extremities. Strength 4/5 of L upper and lower extremities.     Assessment:     (1) Medicare Wellness, Subsequent. (2) Annual Physical Examination (3) Hyperlipidemia (4) Fibromuscular Dystrophy     Plan:     (1) During the course of the visit the patient was educated and counseled about  appropriate screening and preventive services including:    Td vaccine  Screening mammography  Screening Pap smear and pelvic exam   (2) Depression screen negative. Health Maintenance reviewed -- Declines tetanus booster. Preventive schedule discussed and handout given in AVS. Will obtain fasting labs today.  (3) Will obtain fasting lipids and CMP today. Continue statin and ASA daily.  (4) Doing well overall. Ambulates well considering. No falls. Strength 4/5 of L extremities. Continue current regimen. FU with Neurology as scheduled.  Patient Instructions (the written plan) was given to the patient.  Medicare Attestation I have personally reviewed: The patient's medical and social history Their use of alcohol, tobacco or illicit drugs Their current medications and supplements The patient's functional ability including ADLs,fall risks, home safety risks, cognitive, and hearing and visual impairment Diet and physical activities Evidence for depression or mood disorders  The patient's weight, height, BMI, and visual acuity have been recorded in the chart.  I have made referrals, counseling, and provided education to the patient based on review of the above and I have provided the patient with a written personalized care plan for preventive services.     Marcelline MatesMartin, Dezyrae Kensinger [place], [place] JerseyPA-C   10/02/2015

## 2015-10-02 NOTE — Patient Instructions (Signed)
Please go to the lab for blood work.   Our office will call you with your results unless you have chosen to receive results via MyChart.  If your blood work is normal we will follow-up each year for physicals and as scheduled for chronic medical problems.  If anything is abnormal we will treat accordingly and get you in for a follow-up.  Preventive Care for Adults, Female A healthy lifestyle and preventive care can promote health and wellness. Preventive health guidelines for women include the following key practices.  A routine yearly physical is a good way to check with your health care provider about your health and preventive screening. It is a chance to share any concerns and updates on your health and to receive a thorough exam.  Visit your dentist for a routine exam and preventive care every 6 months. Brush your teeth twice a day and floss once a day. Good oral hygiene prevents tooth decay and gum disease.  The frequency of eye exams is based on your age, health, family medical history, use of contact lenses, and other factors. Follow your health care provider's recommendations for frequency of eye exams.  Eat a healthy diet. Foods like vegetables, fruits, whole grains, low-fat dairy products, and lean protein foods contain the nutrients you need without too many calories. Decrease your intake of foods high in solid fats, added sugars, and salt. Eat the right amount of calories for you.Get information about a proper diet from your health care provider, if necessary.  Regular physical exercise is one of the most important things you can do for your health. Most adults should get at least 150 minutes of moderate-intensity exercise (any activity that increases your heart rate and causes you to sweat) each week. In addition, most adults need muscle-strengthening exercises on 2 or more days a week.  Maintain a healthy weight. The body mass index (BMI) is a screening tool to identify possible  weight problems. It provides an estimate of body fat based on height and weight. Your health care provider can find your BMI and can help you achieve or maintain a healthy weight.For adults 20 years and older:  A BMI below 18.5 is considered underweight.  A BMI of 18.5 to 24.9 is normal.  A BMI of 25 to 29.9 is considered overweight.  A BMI of 30 and above is considered obese.  Maintain normal blood lipids and cholesterol levels by exercising and minimizing your intake of saturated fat. Eat a balanced diet with plenty of fruit and vegetables. Blood tests for lipids and cholesterol should begin at age 68 and be repeated every 5 years. If your lipid or cholesterol levels are high, you are over 50, or you are at high risk for heart disease, you may need your cholesterol levels checked more frequently.Ongoing high lipid and cholesterol levels should be treated with medicines if diet and exercise are not working.  If you smoke, find out from your health care provider how to quit. If you do not use tobacco, do not start.  Lung cancer screening is recommended for adults aged 58-80 years who are at high risk for developing lung cancer because of a history of smoking. A yearly low-dose CT scan of the lungs is recommended for people who have at least a 30-pack-year history of smoking and are a current smoker or have quit within the past 15 years. A pack year of smoking is smoking an average of 1 pack of cigarettes a day for 1 year (  for example: 1 pack a day for 30 years or 2 packs a day for 15 years). Yearly screening should continue until the smoker has stopped smoking for at least 15 years. Yearly screening should be stopped for people who develop a health problem that would prevent them from having lung cancer treatment.  If you are pregnant, do not drink alcohol. If you are breastfeeding, be very cautious about drinking alcohol. If you are not pregnant and choose to drink alcohol, do not have more than 1  drink per day. One drink is considered to be 12 ounces (355 mL) of beer, 5 ounces (148 mL) of wine, or 1.5 ounces (44 mL) of liquor.  Avoid use of street drugs. Do not share needles with anyone. Ask for help if you need support or instructions about stopping the use of drugs.  High blood pressure causes heart disease and increases the risk of stroke. Your blood pressure should be checked at least every 1 to 2 years. Ongoing high blood pressure should be treated with medicines if weight loss and exercise do not work.  If you are 28-30 years old, ask your health care provider if you should take aspirin to prevent strokes.  Diabetes screening is done by taking a blood sample to check your blood glucose level after you have not eaten for a certain period of time (fasting). If you are not overweight and you do not have risk factors for diabetes, you should be screened once every 3 years starting at age 65. If you are overweight or obese and you are 38-54 years of age, you should be screened for diabetes every year as part of your cardiovascular risk assessment.  Breast cancer screening is essential preventive care for women. You should practice "breast self-awareness." This means understanding the normal appearance and feel of your breasts and may include breast self-examination. Any changes detected, no matter how small, should be reported to a health care provider. Women in their 25s and 30s should have a clinical breast exam (CBE) by a health care provider as part of a regular health exam every 1 to 3 years. After age 23, women should have a CBE every year. Starting at age 33, women should consider having a mammogram (breast X-ray test) every year. Women who have a family history of breast cancer should talk to their health care provider about genetic screening. Women at a high risk of breast cancer should talk to their health care providers about having an MRI and a mammogram every year.  Breast cancer  gene (BRCA)-related cancer risk assessment is recommended for women who have family members with BRCA-related cancers. BRCA-related cancers include breast, ovarian, tubal, and peritoneal cancers. Having family members with these cancers may be associated with an increased risk for harmful changes (mutations) in the breast cancer genes BRCA1 and BRCA2. Results of the assessment will determine the need for genetic counseling and BRCA1 and BRCA2 testing.  Your health care provider may recommend that you be screened regularly for cancer of the pelvic organs (ovaries, uterus, and vagina). This screening involves a pelvic examination, including checking for microscopic changes to the surface of your cervix (Pap test). You may be encouraged to have this screening done every 3 years, beginning at age 84.  For women ages 42-65, health care providers may recommend pelvic exams and Pap testing every 3 years, or they may recommend the Pap and pelvic exam, combined with testing for human papilloma virus (HPV), every 5 years. Some types  of HPV increase your risk of cervical cancer. Testing for HPV may also be done on women of any age with unclear Pap test results.  Other health care providers may not recommend any screening for nonpregnant women who are considered low risk for pelvic cancer and who do not have symptoms. Ask your health care provider if a screening pelvic exam is right for you.  If you have had past treatment for cervical cancer or a condition that could lead to cancer, you need Pap tests and screening for cancer for at least 20 years after your treatment. If Pap tests have been discontinued, your risk factors (such as having a new sexual partner) need to be reassessed to determine if screening should resume. Some women have medical problems that increase the chance of getting cervical cancer. In these cases, your health care provider may recommend more frequent screening and Pap tests.  Colorectal  cancer can be detected and often prevented. Most routine colorectal cancer screening begins at the age of 78 years and continues through age 12 years. However, your health care provider may recommend screening at an earlier age if you have risk factors for colon cancer. On a yearly basis, your health care provider may provide home test kits to check for hidden blood in the stool. Use of a small camera at the end of a tube, to directly examine the colon (sigmoidoscopy or colonoscopy), can detect the earliest forms of colorectal cancer. Talk to your health care provider about this at age 86, when routine screening begins. Direct exam of the colon should be repeated every 5-10 years through age 59 years, unless early forms of precancerous polyps or small growths are found.  People who are at an increased risk for hepatitis B should be screened for this virus. You are considered at high risk for hepatitis B if:  You were born in a country where hepatitis B occurs often. Talk with your health care provider about which countries are considered high risk.  Your parents were born in a high-risk country and you have not received a shot to protect against hepatitis B (hepatitis B vaccine).  You have HIV or AIDS.  You use needles to inject street drugs.  You live with, or have sex with, someone who has hepatitis B.  You get hemodialysis treatment.  You take certain medicines for conditions like cancer, organ transplantation, and autoimmune conditions.  Hepatitis C blood testing is recommended for all people born from [birth year] through 1965 and any individual with known risks for hepatitis C.  Practice safe sex. Use condoms and avoid high-risk sexual practices to reduce the spread of sexually transmitted infections (STIs). STIs include gonorrhea, chlamydia, syphilis, trichomonas, herpes, HPV, and human immunodeficiency virus (HIV). Herpes, HIV, and HPV are viral illnesses that have no cure. They can result in  disability, cancer, and death.  You should be screened for sexually transmitted illnesses (STIs) including gonorrhea and chlamydia if:  You are sexually active and are younger than 24 years.  You are older than 24 years and your health care provider tells you that you are at risk for this type of infection.  Your sexual activity has changed since you were last screened and you are at an increased risk for chlamydia or gonorrhea. Ask your health care provider if you are at risk.  If you are at risk of being infected with HIV, it is recommended that you take a prescription medicine daily to prevent HIV infection. This is called  preexposure prophylaxis (PrEP). You are considered at risk if:  You are sexually active and do not regularly use condoms or know the HIV status of your partner(s).  You take drugs by injection.  You are sexually active with a partner who has HIV.  Talk with your health care provider about whether you are at high risk of being infected with HIV. If you choose to begin PrEP, you should first be tested for HIV. You should then be tested every 3 months for as long as you are taking PrEP.  Osteoporosis is a disease in which the bones lose minerals and strength with aging. This can result in serious bone fractures or breaks. The risk of osteoporosis can be identified using a bone density scan. Women ages 21 years and over and women at risk for fractures or osteoporosis should discuss screening with their health care providers. Ask your health care provider whether you should take a calcium supplement or vitamin D to reduce the rate of osteoporosis.  Menopause can be associated with physical symptoms and risks. Hormone replacement therapy is available to decrease symptoms and risks. You should talk to your health care provider about whether hormone replacement therapy is right for you.  Use sunscreen. Apply sunscreen liberally and repeatedly throughout the day. You should seek  shade when your shadow is shorter than you. Protect yourself by wearing long sleeves, pants, a wide-brimmed hat, and sunglasses year round, whenever you are outdoors.  Once a month, do a whole body skin exam, using a mirror to look at the skin on your back. Tell your health care provider of new moles, moles that have irregular borders, moles that are larger than a pencil eraser, or moles that have changed in shape or color.  Stay current with required vaccines (immunizations).  Influenza vaccine. All adults should be immunized every year.  Tetanus, diphtheria, and acellular pertussis (Td, Tdap) vaccine. Pregnant women should receive 1 dose of Tdap vaccine during each pregnancy. The dose should be obtained regardless of the length of time since the last dose. Immunization is preferred during the 27th-36th week of gestation. An adult who has not previously received Tdap or who does not know her vaccine status should receive 1 dose of Tdap. This initial dose should be followed by tetanus and diphtheria toxoids (Td) booster doses every 10 years. Adults with an unknown or incomplete history of completing a 3-dose immunization series with Td-containing vaccines should begin or complete a primary immunization series including a Tdap dose. Adults should receive a Td booster every 10 years.  Varicella vaccine. An adult without evidence of immunity to varicella should receive 2 doses or a second dose if she has previously received 1 dose. Pregnant females who do not have evidence of immunity should receive the first dose after pregnancy. This first dose should be obtained before leaving the health care facility. The second dose should be obtained 4-8 weeks after the first dose.  Human papillomavirus (HPV) vaccine. Females aged 13-26 years who have not received the vaccine previously should obtain the 3-dose series. The vaccine is not recommended for use in pregnant females. However, pregnancy testing is not needed  before receiving a dose. If a female is found to be pregnant after receiving a dose, no treatment is needed. In that case, the remaining doses should be delayed until after the pregnancy. Immunization is recommended for any person with an immunocompromised condition through the age of 23 years if she did not get any or all  doses earlier. During the 3-dose series, the second dose should be obtained 4-8 weeks after the first dose. The third dose should be obtained 24 weeks after the first dose and 16 weeks after the second dose.  Zoster vaccine. One dose is recommended for adults aged 60 years or older unless certain conditions are present.  Measles, mumps, and rubella (MMR) vaccine. Adults born before 1957 generally are considered immune to measles and mumps. Adults born in 1957 or later should have 1 or more doses of MMR vaccine unless there is a contraindication to the vaccine or there is laboratory evidence of immunity to each of the three diseases. A routine second dose of MMR vaccine should be obtained at least 28 days after the first dose for students attending postsecondary schools, health care workers, or international travelers. People who received inactivated measles vaccine or an unknown type of measles vaccine during 1963-1967 should receive 2 doses of MMR vaccine. People who received inactivated mumps vaccine or an unknown type of mumps vaccine before 1979 and are at high risk for mumps infection should consider immunization with 2 doses of MMR vaccine. For females of childbearing age, rubella immunity should be determined. If there is no evidence of immunity, females who are not pregnant should be vaccinated. If there is no evidence of immunity, females who are pregnant should delay immunization until after pregnancy. Unvaccinated health care workers born before 1957 who lack laboratory evidence of measles, mumps, or rubella immunity or laboratory confirmation of disease should consider measles and  mumps immunization with 2 doses of MMR vaccine or rubella immunization with 1 dose of MMR vaccine.  Pneumococcal 13-valent conjugate (PCV13) vaccine. When indicated, a person who is uncertain of his immunization history and has no record of immunization should receive the PCV13 vaccine. All adults 65 years of age and older should receive this vaccine. An adult aged 19 years or older who has certain medical conditions and has not been previously immunized should receive 1 dose of PCV13 vaccine. This PCV13 should be followed with a dose of pneumococcal polysaccharide (PPSV23) vaccine. Adults who are at high risk for pneumococcal disease should obtain the PPSV23 vaccine at least 8 weeks after the dose of PCV13 vaccine. Adults older than 46 years of age who have normal immune system function should obtain the PPSV23 vaccine dose at least 1 year after the dose of PCV13 vaccine.  Pneumococcal polysaccharide (PPSV23) vaccine. When PCV13 is also indicated, PCV13 should be obtained first. All adults aged 65 years and older should be immunized. An adult younger than age 65 years who has certain medical conditions should be immunized. Any person who resides in a nursing home or long-term care facility should be immunized. An adult smoker should be immunized. People with an immunocompromised condition and certain other conditions should receive both PCV13 and PPSV23 vaccines. People with human immunodeficiency virus (HIV) infection should be immunized as soon as possible after diagnosis. Immunization during chemotherapy or radiation therapy should be avoided. Routine use of PPSV23 vaccine is not recommended for American Indians, [place] Natives, or people younger than 65 years unless there are medical conditions that require PPSV23 vaccine. When indicated, people who have unknown immunization and have no record of immunization should receive PPSV23 vaccine. One-time revaccination 5 years after the first dose of PPSV23 is  recommended for people aged 19-64 years who have chronic kidney failure, nephrotic syndrome, asplenia, or immunocompromised conditions. People who received 1-2 doses of PPSV23 before age 65 years should receive   another dose of PPSV23 vaccine at age 44 years or later if at least 5 years have passed since the previous dose. Doses of PPSV23 are not needed for people immunized with PPSV23 at or after age 33 years.  Meningococcal vaccine. Adults with asplenia or persistent complement component deficiencies should receive 2 doses of quadrivalent meningococcal conjugate (MenACWY-D) vaccine. The doses should be obtained at least 2 months apart. Microbiologists working with certain meningococcal bacteria, [employer] recruits, people at risk during an outbreak, and people who travel to or live in countries with a high rate of meningitis should be immunized. A first-year college student up through age 46 years who is living in a residence hall should receive a dose if she did not receive a dose on or after her 16th birthday. Adults who have certain high-risk conditions should receive one or more doses of vaccine.  Hepatitis A vaccine. Adults who wish to be protected from this disease, have certain high-risk conditions, work with hepatitis A-infected animals, work in hepatitis A research labs, or travel to or work in countries with a high rate of hepatitis A should be immunized. Adults who were previously unvaccinated and who anticipate close contact with an international adoptee during the first 60 days after arrival in the [place] from a country with a high rate of hepatitis A should be immunized.  Hepatitis B vaccine. Adults who wish to be protected from this disease, have certain high-risk conditions, may be exposed to blood or other infectious body fluids, are household contacts or sex partners of hepatitis B positive people, are clients or workers in certain care facilities, or travel to or work in countries  with a high rate of hepatitis B should be immunized.  Haemophilus influenzae type b (Hib) vaccine. A previously unvaccinated person with asplenia or sickle cell disease or having a scheduled splenectomy should receive 1 dose of Hib vaccine. Regardless of previous immunization, a recipient of a hematopoietic stem cell transplant should receive a 3-dose series 6-12 months after her successful transplant. Hib vaccine is not recommended for adults with HIV infection. Preventive Services / Frequency Ages 24 to 12 years  Blood pressure check.** / Every 3-5 years.  Lipid and cholesterol check.** / Every 5 years beginning at age 49.  Clinical breast exam.** / Every 3 years for women in their 66s and 11s.  BRCA-related cancer risk assessment.** / For women who have family members with a BRCA-related cancer (breast, ovarian, tubal, or peritoneal cancers).  Pap test.** / Every 2 years from ages 34 through 94. Every 3 years starting at age 52 through age 56 or 82 with a history of 3 consecutive normal Pap tests.  HPV screening.** / Every 3 years from ages 80 through ages 63 to 8 with a history of 3 consecutive normal Pap tests.  Hepatitis C blood test.** / For any individual with known risks for hepatitis C.  Skin self-exam. / Monthly.  Influenza vaccine. / Every year.  Tetanus, diphtheria, and acellular pertussis (Tdap, Td) vaccine.** / Consult your health care provider. Pregnant women should receive 1 dose of Tdap vaccine during each pregnancy. 1 dose of Td every 10 years.  Varicella vaccine.** / Consult your health care provider. Pregnant females who do not have evidence of immunity should receive the first dose after pregnancy.  HPV vaccine. / 3 doses over 6 months, if 16 and younger. The vaccine is not recommended for use in pregnant females. However, pregnancy testing is not needed before receiving a dose.  Measles, mumps, rubella (MMR) vaccine.** / You need at least 1 dose of MMR if you  were born in 1957 or later. You may also need a 2nd dose. For females of childbearing age, rubella immunity should be determined. If there is no evidence of immunity, females who are not pregnant should be vaccinated. If there is no evidence of immunity, females who are pregnant should delay immunization until after pregnancy.  Pneumococcal 13-valent conjugate (PCV13) vaccine.** / Consult your health care provider.  Pneumococcal polysaccharide (PPSV23) vaccine.** / 1 to 2 doses if you smoke cigarettes or if you have certain conditions.  Meningococcal vaccine.** / 1 dose if you are age 52 to 43 years and a Market researcher living in a residence hall, or have one of several medical conditions, you need to get vaccinated against meningococcal disease. You may also need additional booster doses.  Hepatitis A vaccine.** / Consult your health care provider.  Hepatitis B vaccine.** / Consult your health care provider.  Haemophilus influenzae type b (Hib) vaccine.** / Consult your health care provider. Ages 74 to 6 years  Blood pressure check.** / Every year.  Lipid and cholesterol check.** / Every 5 years beginning at age 77 years.  Lung cancer screening. / Every year if you are aged 55-80 years and have a 30-pack-year history of smoking and currently smoke or have quit within the past 15 years. Yearly screening is stopped once you have quit smoking for at least 15 years or develop a health problem that would prevent you from having lung cancer treatment.  Clinical breast exam.** / Every year after age 44 years.  BRCA-related cancer risk assessment.** / For women who have family members with a BRCA-related cancer (breast, ovarian, tubal, or peritoneal cancers).  Mammogram.** / Every year beginning at age 7 years and continuing for as long as you are in good health. Consult with your health care provider.  Pap test.** / Every 3 years starting at age 46 years through age 34 or 29 years  with a history of 3 consecutive normal Pap tests.  HPV screening.** / Every 3 years from ages 64 years through ages 60 to 41 years with a history of 3 consecutive normal Pap tests.  Fecal occult blood test (FOBT) of stool. / Every year beginning at age 73 years and continuing until age 39 years. You may not need to do this test if you get a colonoscopy every 10 years.  Flexible sigmoidoscopy or colonoscopy.** / Every 5 years for a flexible sigmoidoscopy or every 10 years for a colonoscopy beginning at age 67 years and continuing until age 69 years.  Hepatitis C blood test.** / For all people born from [birth year] through 1965 and any individual with known risks for hepatitis C.  Skin self-exam. / Monthly.  Influenza vaccine. / Every year.  Tetanus, diphtheria, and acellular pertussis (Tdap/Td) vaccine.** / Consult your health care provider. Pregnant women should receive 1 dose of Tdap vaccine during each pregnancy. 1 dose of Td every 10 years.  Varicella vaccine.** / Consult your health care provider. Pregnant females who do not have evidence of immunity should receive the first dose after pregnancy.  Zoster vaccine.** / 1 dose for adults aged 31 years or older.  Measles, mumps, rubella (MMR) vaccine.** / You need at least 1 dose of MMR if you were born in 1957 or later. You may also need a second dose. For females of childbearing age, rubella immunity should be determined. If there is no  evidence of immunity, females who are not pregnant should be vaccinated. If there is no evidence of immunity, females who are pregnant should delay immunization until after pregnancy.  Pneumococcal 13-valent conjugate (PCV13) vaccine.** / Consult your health care provider.  Pneumococcal polysaccharide (PPSV23) vaccine.** / 1 to 2 doses if you smoke cigarettes or if you have certain conditions.  Meningococcal vaccine.** / Consult your health care provider.  Hepatitis A vaccine.** / Consult your health care  provider.  Hepatitis B vaccine.** / Consult your health care provider.  Haemophilus influenzae type b (Hib) vaccine.** / Consult your health care provider. Ages 12 years and over  Blood pressure check.** / Every year.  Lipid and cholesterol check.** / Every 5 years beginning at age 70 years.  Lung cancer screening. / Every year if you are aged 56-80 years and have a 30-pack-year history of smoking and currently smoke or have quit within the past 15 years. Yearly screening is stopped once you have quit smoking for at least 15 years or develop a health problem that would prevent you from having lung cancer treatment.  Clinical breast exam.** / Every year after age 73 years.  BRCA-related cancer risk assessment.** / For women who have family members with a BRCA-related cancer (breast, ovarian, tubal, or peritoneal cancers).  Mammogram.** / Every year beginning at age 11 years and continuing for as long as you are in good health. Consult with your health care provider.  Pap test.** / Every 3 years starting at age 73 years through age 53 or 59 years with 3 consecutive normal Pap tests. Testing can be stopped between 65 and 70 years with 3 consecutive normal Pap tests and no abnormal Pap or HPV tests in the past 10 years.  HPV screening.** / Every 3 years from ages 50 years through ages 65 or 75 years with a history of 3 consecutive normal Pap tests. Testing can be stopped between 65 and 70 years with 3 consecutive normal Pap tests and no abnormal Pap or HPV tests in the past 10 years.  Fecal occult blood test (FOBT) of stool. / Every year beginning at age 65 years and continuing until age 33 years. You may not need to do this test if you get a colonoscopy every 10 years.  Flexible sigmoidoscopy or colonoscopy.** / Every 5 years for a flexible sigmoidoscopy or every 10 years for a colonoscopy beginning at age 49 years and continuing until age 89 years.  Hepatitis C blood test.** / For all  people born from [birth year] through 1966 and any individual with known risks for hepatitis C.  Osteoporosis screening.** / A one-time screening for women ages 2 years and over and women at risk for fractures or osteoporosis.  Skin self-exam. / Monthly.  Influenza vaccine. / Every year.  Tetanus, diphtheria, and acellular pertussis (Tdap/Td) vaccine.** / 1 dose of Td every 10 years.  Varicella vaccine.** / Consult your health care provider.  Zoster vaccine.** / 1 dose for adults aged 36 years or older.  Pneumococcal 13-valent conjugate (PCV13) vaccine.** / Consult your health care provider.  Pneumococcal polysaccharide (PPSV23) vaccine.** / 1 dose for all adults aged 109 years and older.  Meningococcal vaccine.** / Consult your health care provider.  Hepatitis A vaccine.** / Consult your health care provider.  Hepatitis B vaccine.** / Consult your health care provider.  Haemophilus influenzae type b (Hib) vaccine.** / Consult your health care provider. ** Family history and personal history of risk and conditions may change your health  care provider's recommendations.   This information is not intended to replace advice given to you by your health care provider. Make sure you discuss any questions you have with your health care provider.   Document Released: 04/05/2001 Document Revised: 02/28/2014 Document Reviewed: 07/05/2010 Elsevier Interactive Patient Education 2016 [employer] .      

## 2015-10-05 ENCOUNTER — Encounter: Payer: Self-pay | Admitting: Physician Assistant

## 2015-10-14 ENCOUNTER — Ambulatory Visit: Payer: Medicare Other | Admitting: Neurology

## 2015-10-22 ENCOUNTER — Other Ambulatory Visit: Payer: Self-pay | Admitting: Physician Assistant

## 2015-10-22 NOTE — Telephone Encounter (Signed)
Last filled: 09/23/15, #90, 0 RF Sig: TAKE ONE TABLET BY MOUTH THREE TIMES DAILY AS NEEDED FOR MUSCLE SPASMS

## 2015-10-29 ENCOUNTER — Other Ambulatory Visit: Payer: Self-pay | Admitting: Physician Assistant

## 2015-10-29 MED ORDER — PROMETHAZINE HCL 25 MG PO TABS: 25.0000 mg | ORAL_TABLET | Freq: Four times a day (QID) | ORAL | 0 refills | Status: DC | PRN

## 2015-10-29 NOTE — Telephone Encounter (Signed)
Called her as it looks like Courtney Garza last wrote phenergan for her back in November. Wanted to make sure she is not sick.  Pt reports she has no acute illness or change in her condition,but she she just has this on hand for PRN use.  Will refill for her

## 2015-10-30 ENCOUNTER — Ambulatory Visit (INDEPENDENT_AMBULATORY_CARE_PROVIDER_SITE_OTHER): Payer: Medicare Other | Admitting: Neurology

## 2015-10-30 ENCOUNTER — Encounter: Payer: Self-pay | Admitting: Physician Assistant

## 2015-10-30 DIAGNOSIS — G8114 Spastic hemiplegia affecting left nondominant side: Secondary | ICD-10-CM | POA: Diagnosis not present

## 2015-10-30 DIAGNOSIS — G43801 Other migraine, not intractable, with status migrainosus: Secondary | ICD-10-CM

## 2015-10-30 MED ORDER — VENLAFAXINE HCL ER 150 MG PO CP24: ORAL_CAPSULE | ORAL | 5 refills | Status: DC

## 2015-10-30 MED ORDER — ONABOTULINUMTOXINA 100 UNITS IJ SOLR
150.0000 [IU] | Freq: Once | INTRAMUSCULAR | Status: AC
  Administered 2015-10-30: 150 [IU] via INTRAMUSCULAR

## 2015-10-30 MED ORDER — ONABOTULINUMTOXINA 100 UNITS IJ SOLR
155.0000 [IU] | Freq: Once | INTRAMUSCULAR | Status: AC
  Administered 2015-10-30: 155 [IU] via INTRAMUSCULAR

## 2015-10-30 NOTE — Progress Notes (Addendum)
Procedure Note: BOTOX  Attending: Dr. Shon MilletAdam Anastaisa Wooding  I. 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 Risk discussed included, but were not limited pain and discomfort, bleeding, bruising, excessive weakness, venous thrombosis, muscle atrophy and dysphagia. A copy of the patient medication guide was given to the patient which explains the blackbox warning.  Patients identity and treatment sites confirmed Yes. .  Details of Procedure: Skin was cleaned with alcohol. A hollow monopolor needle was introduced to the target muscle. Prior to injection, the needle plunger was aspirated to make sure the needle was not within a blood vessel. There was no blood retrieved on aspiration.   Following is a summary of the muscles injected And the amount of Botulinum toxin used:   Dilution 0.9% preservative free saline mixed with 100 u Botox type A to make 10 U per 0.1cc  Injections  Left flexor digitorum profundus 50 units (total of 3 sites) Left flexor digitorum superficialis 50 units (total of 3 sites) Left flexor carpi ulnaris 25 units (total of 1 site) Left flexor carpi radialis 25 units (total of 1 site) TOTAL UNITS: 150 units  Agent: Botulinum Type A ( Onobotulinum Toxin type A ). 2 vials of Botox were used, each containing 100 units and freshly diluted with 1 mL of sterile, non-preserved saline  Total injected (Units): 150 units Total wasted (Units): 50 units   Pt tolerated procedure well without complications.  Reinjection is anticipated in 3 months.   II. Preoperative Diagnosis(es): Chronic migraine  Consent obtained from: The patient Benefits discussed included, but were not limited to decreased muscle tightness, increased joint range  of motion, and decreased pain. Risk discussed included, but were not limited pain and discomfort, bleeding, bruising, excessive weakness, venous thrombosis, muscle atrophy and dysphagia. A copy of the patient medication guide was given to the patient which explains the blackbox warning.  Patients identity and treatment sites confirmed Yes. .  Details of Procedure: Skin was cleaned with alcohol. Prior to injection, the needle plunger was aspirated to make sure the needle was not within a blood vessel. There was no blood retrieved on aspiration.   Following is a summary of the muscles injected And the amount of Botulinum toxin used:  Dilution 200 units were reconstituted with 4 mL 0.9% sodium chloride. Skin was cleaned with alcohol prior to injection. A 30-gauge, 0.5-inch needle was used.  Total: 4 mL (200 units)  # of injection sitesMuscleDose Total 1  Corrugator 0.1 mL (5 units) 1 Procerus 0.2 mL (10 units) 2 each sideFrontalis0.4 mL (20 units) 4 each sideTemporalis0.8 mL (40 units) 3 each sideOccipitalis0.6 mL (30 units) 2 each sideCervical paraspinals0.4 mL (20 units) 3 each sideTrapezius0.6 mL (30 units)  Total 31Totalused155 units Total wasted5 units   Pt tolerated procedure well without complications.  Reinjection is anticipated in 3 months.  Kisean Rollo R. Everlena CooperJaffe, DO

## 2015-11-04 ENCOUNTER — Other Ambulatory Visit: Payer: Self-pay | Admitting: Physician Assistant

## 2015-11-04 MED ORDER — PROMETHAZINE HCL 25 MG PO TABS: 25.0000 mg | ORAL_TABLET | Freq: Four times a day (QID) | ORAL | 0 refills | Status: DC | PRN

## 2015-11-06 ENCOUNTER — Telehealth: Payer: Self-pay | Admitting: *Deleted

## 2015-11-06 NOTE — Telephone Encounter (Signed)
Received Patient Assistance Rx, Effexor XR 150mg  x1 bottle #90; pt informed, understood & agreed to p/u during regular business hours/SLS 09/15

## 2015-12-17 ENCOUNTER — Encounter: Payer: Self-pay | Admitting: Physician Assistant

## 2015-12-18 MED ORDER — LORAZEPAM 0.5 MG PO TABS: 0.5000 mg | ORAL_TABLET | Freq: Two times a day (BID) | ORAL | 1 refills | Status: DC | PRN

## 2015-12-23 ENCOUNTER — Other Ambulatory Visit: Payer: Self-pay | Admitting: Physician Assistant

## 2015-12-24 NOTE — Telephone Encounter (Signed)
I have refilled Atorvastatin with #30 tablets and 5 refills. Patient was last seen 10/02/15.Pt also requested cyclobenzaprine and have pended for Courtney MatesWilliam Garza review. Please advise

## 2015-12-25 ENCOUNTER — Ambulatory Visit: Payer: Medicare Other | Admitting: Physician Assistant

## 2015-12-29 ENCOUNTER — Ambulatory Visit (INDEPENDENT_AMBULATORY_CARE_PROVIDER_SITE_OTHER): Payer: Medicare Other | Admitting: Physician Assistant

## 2015-12-29 ENCOUNTER — Encounter: Payer: Self-pay | Admitting: Physician Assistant

## 2015-12-29 VITALS — BP 100/68 | HR 100 | Temp 98.0°F | Ht 60.0 in | Wt 178.2 lb

## 2015-12-29 DIAGNOSIS — R1011 Right upper quadrant pain: Secondary | ICD-10-CM | POA: Diagnosis not present

## 2015-12-29 NOTE — Patient Instructions (Addendum)
Please go to the lab for blood work. Then go downstairs to schedule your Ultrasound. I will call with your results. Start a mostly liquid diet. Bland solids are ok.  Tylenol for pain.  We will alter regimen based on results. If anything acutely worsens before workup is complete, please go to the ER.

## 2015-12-29 NOTE — Progress Notes (Signed)
Pre visit review using our clinic review tool, if applicable. No additional management support is needed unless otherwise documented below in the visit note. 

## 2015-12-29 NOTE — Progress Notes (Signed)
Patient presents to clinic c/o intermittent RUQ pain with nausea worsening with mealtime. Episodes last for about 1 hour after eating. Denies heart burn or indigestion.  Endorses intermittent loose stools. Has been hydrating well. Nausea comes with pain. Denies emesis.  Denies change to exercise regimen.  Past Medical History:  Diagnosis Date  . Anxiety   . Asthma   . Chest pain    a. 10/2014 CTA Chest: no dissection.  . Depression   . Dysarthria    Left arm  . Fibromuscular dysplasia ([place])   . High cholesterol   . History of stroke    a. 04/2014 MRI Brain: mod large chronic right MCA territory, chronic right cervical ICA dissection, smooth irregularity of the distal L cervical ICA consistent w/ underlying FMD, occlusion of multiple R MCA branch vessels in area of chronic infarct.   . Left hemiparesis ([place])   . Migraine     Current Outpatient Prescriptions on File Prior to Visit  Medication Sig Dispense Refill  . albuterol (PROAIR HFA) 108 (90 BASE) MCG/ACT inhaler Inhale 2 puffs into the lungs every 6 (six) hours as needed for wheezing or shortness of breath. 1 Inhaler 12  . amitriptyline (ELAVIL) 25 MG tablet TAKE 1 TABLET(25 MG) BY MOUTH AT BEDTIME 30 tablet 3  . Ascorbic Acid (VITAMIN C) 1000 MG tablet Take 1,000 mg by mouth daily. Reported on 07/27/2015    . aspirin 325 MG tablet Take 325 mg by mouth daily.    Marland Kitchen atorvastatin (LIPITOR) 20 MG tablet TAKE ONE TABLET BY MOUTH ONCE DAILY 30 tablet 5  . cetirizine (ZYRTEC) 10 MG tablet Take 1 tablet (10 mg total) by mouth daily. 30 tablet 5  . cyclobenzaprine (FLEXERIL) 10 MG tablet TAKE ONE TABLET BY MOUTH THREE TIMES DAILY AS NEEDED FOR MUSCLE SPASM 90 tablet 1  . fluticasone-salmeterol (ADVAIR HFA) 115-21 MCG/ACT inhaler Inhale 2 puffs into the lungs 2 (two) times daily. 1 Inhaler 12  . LORazepam (ATIVAN) 0.5 MG tablet Take 1 tablet (0.5 mg total) by mouth 2 (two) times daily as needed for anxiety. 60 tablet 1  . omeprazole (PRILOSEC)  20 MG capsule TAKE 1 CAPSULE(20 MG) BY MOUTH TWICE DAILY BEFORE A MEAL (Patient taking differently: TAKE 1 CAPSULE(20 MG) BY MOUTH  DAILY BEFORE A MEAL) 60 capsule 6  . promethazine (PHENERGAN) 25 MG tablet Take 1 tablet (25 mg total) by mouth every 6 (six) hours as needed for nausea or vomiting. 30 tablet 0  . venlafaxine XR (EFFEXOR-XR) 150 MG 24 hr capsule TAKE 1 CAPSULE(150 MG) BY MOUTH DAILY WITH BREAKFAST 30 capsule 5   No current facility-administered medications on file prior to visit.     Allergies  Allergen Reactions  . Duloxetine Hcl Rash    hallucinations  . Acetaminophen Hives  . Fish Allergy     Hives and migraines  . Hydromorphone Other (See Comments)    Could not speak or keep her eyes open  . Ondansetron Nausea And Vomiting  . Tylenol [Acetaminophen] Hives    Family History  Problem Relation Age of Onset  . Cancer Mother   . Aortic aneurysm Father     Social History   Social History  . Marital status: Married    Spouse name: Marya Amsler   . Number of children: 3  . Years of education: 10 th   Occupational History  .      Disabled   Social History Main Topics  . Smoking status: Former Smoker  Quit date: 02/22/2011  . Smokeless tobacco: Never Used     Comment: Quit 2013  . Alcohol use No  . Drug use:     Types: Marijuana  . Sexual activity: Not Asked   Other Topics Concern  . None   Social History Narrative   Patient lives at home with her husband Belenda Cruise.   Disabled.   Education 10 th   Right handed.   Caffeine six cup of coffee daily.      Review of Systems - See HPI.  All other ROS are negative.  BP 100/68 (BP Location: Left Arm, Patient Position: Sitting)   Pulse 100   Temp 98 F (36.7 C) (Oral)   Ht 5' (1.524 m)   Wt 178 lb 3.2 oz (80.8 kg)   SpO2 97%   BMI 34.80 kg/m   Physical Exam  Constitutional: She is oriented to person, place, and time and well-developed, well-nourished, and in no distress.  HENT:  Head: Normocephalic.    Eyes: Conjunctivae are normal.  Cardiovascular: Normal rate, regular rhythm, normal heart sounds and intact distal pulses.   Pulmonary/Chest: Effort normal and breath sounds normal. No respiratory distress. She has no wheezes. She has no rales. She exhibits no tenderness.  Abdominal: Bowel sounds are normal. There is tenderness in the right upper quadrant. There is no CVA tenderness and negative Murphy's sign.  Neurological: She is alert and oriented to person, place, and time.  Skin: Skin is warm and dry. No rash noted.  Psychiatric: Affect normal.  Vitals reviewed.   Recent Results (from the past 2160 hour(s))  CBC     Status: None   Collection Time: 10/02/15  9:40 AM  Result Value Ref Range   WBC 6.6 4.0 - 10.5 K/uL   RBC 4.36 3.87 - 5.11 Mil/uL   Platelets 276.0 150.0 - 400.0 K/uL   Hemoglobin 13.5 12.0 - 15.0 g/dL   HCT 40.5 36.0 - 46.0 %   MCV 93.1 78.0 - 100.0 fl   MCHC 33.2 30.0 - 36.0 g/dL   RDW 14.2 11.5 - 15.5 %  Comp Met (CMET)     Status: Abnormal   Collection Time: 10/02/15  9:40 AM  Result Value Ref Range   Sodium 138 135 - 145 mEq/L   Potassium 3.7 3.5 - 5.1 mEq/L   Chloride 103 96 - 112 mEq/L   CO2 28 19 - 32 mEq/L   Glucose, Bld 91 70 - 99 mg/dL   BUN 4 (L) 6 - 23 mg/dL   Creatinine, Ser 0.71 0.40 - 1.20 mg/dL   Total Bilirubin 0.3 0.2 - 1.2 mg/dL   Alkaline Phosphatase 95 39 - 117 U/L   AST 23 0 - 37 U/L   ALT 16 0 - 35 U/L   Total Protein 6.9 6.0 - 8.3 g/dL   Albumin 3.8 3.5 - 5.2 g/dL   Calcium 9.3 8.4 - 10.5 mg/dL   GFR 94.29 >60.00 mL/min  TSH     Status: None   Collection Time: 10/02/15  9:40 AM  Result Value Ref Range   TSH 2.11 0.35 - 4.50 uIU/mL  Hemoglobin A1c     Status: None   Collection Time: 10/02/15  9:40 AM  Result Value Ref Range   Hgb A1c MFr Bld 5.6 4.6 - 6.5 %    Comment: Glycemic Control Guidelines for People with Diabetes:Non Diabetic:  <6%Goal of Therapy: <7%Additional Action Suggested:  >8%   Urinalysis, Routine w reflex  microscopic  Status: Abnormal   Collection Time: 10/02/15  9:40 AM  Result Value Ref Range   Color, Urine YELLOW Yellow;Lt. Yellow   APPearance CLEAR Clear   Specific Gravity, Urine <=1.005 (A) 1.000 - 1.030   pH 6.0 5.0 - 8.0   Total Protein, Urine NEGATIVE Negative   Urine Glucose NEGATIVE Negative   Ketones, ur NEGATIVE Negative   Bilirubin Urine NEGATIVE Negative   Hgb urine dipstick NEGATIVE Negative   Urobilinogen, UA 0.2 0.0 - 1.0   Leukocytes, UA NEGATIVE Negative   Nitrite NEGATIVE Negative   WBC, UA 0-2/hpf 0-2/hpf   RBC / HPF none seen 0-2/hpf   Squamous Epithelial / LPF Few(5-10/hpf) (A) Rare(0-4/hpf)  Lipid Profile     Status: None   Collection Time: 10/02/15  9:40 AM  Result Value Ref Range   Cholesterol 158 0 - 200 mg/dL    Comment: ATP III Classification       Desirable:  < 200 mg/dL               Borderline High:  200 - 239 mg/dL          High:  > = 240 mg/dL   Triglycerides 142.0 0.0 - 149.0 mg/dL    Comment: Normal:  <150 mg/dLBorderline High:  150 - 199 mg/dL   HDL 57.70 >39.00 mg/dL   VLDL 28.4 0.0 - 40.0 mg/dL   LDL Cholesterol 72 0 - 99 mg/dL   Total CHOL/HDL Ratio 3     Comment:                Men          Women1/2 Average Risk     3.4          3.3Average Risk          5.0          4.42X Average Risk          9.6          7.13X Average Risk          15.0          11.0                       NonHDL 100.68     Comment: NOTE:  Non-HDL goal should be 30 mg/dL higher than patient's LDL goal (i.e. LDL goal of < 70 mg/dL, would have non-HDL goal of < 100 mg/dL)    Assessment/Plan: 1. Colicky RUQ abdominal pain Will obtain CBC, CMP and US Abdomen to assess gallbladder. Pain control discussed. Diet reviewed. ER if anything acutely worsens before assessment is complete.  - Comp Met (CMET) - CBC w/Diff - US Abdomen Limited RUQ; Future   Leeanne Rio, PA-C

## 2015-12-30 LAB — CBC WITH DIFFERENTIAL/PLATELET
Basophils Absolute: 0 10*3/uL (ref 0.0–0.1)
Basophils Relative: 0.6 % (ref 0.0–3.0)
Eosinophils Absolute: 0.1 10*3/uL (ref 0.0–0.7)
Eosinophils Relative: 1.6 % (ref 0.0–5.0)
HCT: 40.7 % (ref 36.0–46.0)
Hemoglobin: 13.9 g/dL (ref 12.0–15.0)
Lymphocytes Relative: 44.8 % (ref 12.0–46.0)
Lymphs Abs: 2.9 10*3/uL (ref 0.7–4.0)
MCHC: 34.1 g/dL (ref 30.0–36.0)
MCV: 90.7 fl (ref 78.0–100.0)
Monocytes Absolute: 0.3 10*3/uL (ref 0.1–1.0)
Monocytes Relative: 4.9 % (ref 3.0–12.0)
Neutro Abs: 3.1 10*3/uL (ref 1.4–7.7)
Neutrophils Relative %: 48.1 % (ref 43.0–77.0)
Platelets: 255 10*3/uL (ref 150.0–400.0)
RBC: 4.49 Mil/uL (ref 3.87–5.11)
RDW: 13.5 % (ref 11.5–15.5)
WBC: 6.4 10*3/uL (ref 4.0–10.5)

## 2015-12-30 LAB — COMPREHENSIVE METABOLIC PANEL
ALT: 17 U/L (ref 0–35)
AST: 26 U/L (ref 0–37)
Albumin: 4.1 g/dL (ref 3.5–5.2)
Alkaline Phosphatase: 110 U/L (ref 39–117)
BUN: 5 mg/dL — ABNORMAL LOW (ref 6–23)
CO2: 27 mEq/L (ref 19–32)
Calcium: 9.6 mg/dL (ref 8.4–10.5)
Chloride: 104 mEq/L (ref 96–112)
Creatinine, Ser: 0.74 mg/dL (ref 0.40–1.20)
GFR: 89.8 mL/min (ref >60.00)
Glucose, Bld: 85 mg/dL (ref 70–99)
Potassium: 4.2 mEq/L (ref 3.5–5.1)
Sodium: 141 mEq/L (ref 135–145)
Total Bilirubin: 0.3 mg/dL (ref 0.2–1.2)
Total Protein: 7.4 g/dL (ref 6.0–8.3)

## 2015-12-31 ENCOUNTER — Ambulatory Visit (HOSPITAL_BASED_OUTPATIENT_CLINIC_OR_DEPARTMENT_OTHER)
Admission: RE | Admit: 2015-12-31 | Discharge: 2015-12-31 | Disposition: A | Discharge status: Home / Self Care | Payer: Medicare Other | Source: Ambulatory Visit | Attending: Physician Assistant | Admitting: Physician Assistant

## 2015-12-31 DIAGNOSIS — R1011 Right upper quadrant pain: Secondary | ICD-10-CM | POA: Insufficient documentation

## 2015-12-31 DIAGNOSIS — K7689 Other specified diseases of liver: Secondary | ICD-10-CM | POA: Diagnosis not present

## 2016-01-01 ENCOUNTER — Telehealth: Payer: Self-pay | Admitting: Physician Assistant

## 2016-01-01 MED ORDER — OMEPRAZOLE 20 MG PO CPDR
DELAYED_RELEASE_CAPSULE | ORAL | 6 refills | Status: DC
Start: 2016-01-01 — End: 2016-08-18

## 2016-01-01 NOTE — Telephone Encounter (Signed)
Pharmacy is calling requesting a refill of patient's omeprazole (PRILOSEC) 20 MG capsule. Please advise.  [facility] [address] [facility] [place], [place] - [id number]

## 2016-01-01 NOTE — Telephone Encounter (Signed)
Refill has been sent.  °

## 2016-01-16 ENCOUNTER — Encounter: Payer: Self-pay | Admitting: Physician Assistant

## 2016-01-19 ENCOUNTER — Other Ambulatory Visit: Payer: Self-pay | Admitting: Physician Assistant

## 2016-01-19 NOTE — Telephone Encounter (Signed)
.  rxref

## 2016-01-25 ENCOUNTER — Encounter: Payer: Self-pay | Admitting: Physician Assistant

## 2016-01-29 ENCOUNTER — Ambulatory Visit (INDEPENDENT_AMBULATORY_CARE_PROVIDER_SITE_OTHER): Payer: Medicare Other | Admitting: Neurology

## 2016-01-29 DIAGNOSIS — G43801 Other migraine, not intractable, with status migrainosus: Secondary | ICD-10-CM | POA: Diagnosis not present

## 2016-01-29 DIAGNOSIS — G43709 Chronic migraine without aura, not intractable, without status migrainosus: Secondary | ICD-10-CM | POA: Diagnosis not present

## 2016-01-29 DIAGNOSIS — G8114 Spastic hemiplegia affecting left nondominant side: Secondary | ICD-10-CM | POA: Diagnosis not present

## 2016-01-29 MED ORDER — ONABOTULINUMTOXINA 100 UNITS IJ SOLR
155.0000 [IU] | Freq: Once | INTRAMUSCULAR | Status: AC
  Administered 2016-01-29: 155 [IU] via INTRAMUSCULAR

## 2016-01-29 MED ORDER — ONABOTULINUMTOXINA 100 UNITS IJ SOLR
150.0000 [IU] | Freq: Once | INTRAMUSCULAR | Status: AC
  Administered 2016-01-29: 150 [IU] via INTRAMUSCULAR

## 2016-01-29 NOTE — Progress Notes (Signed)
Procedure Note:BOTOX  Attending: Dr. 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  Patients identity and treatment sites confirmed Yes..  Details of Procedure: Skin was cleaned with alcohol.A hollow monopolor needle was introduced to the target muscle.Prior to injection, the needle plunger was aspirated to make sure the needle was not within a blood vessel.There was no blood retrieved on aspiration.  Following is a summary of the muscles injectedAnd the amount of Botulinum toxin used:   Dilution 0.9% preservative free saline mixed with 100 u Botox type A to make 10 U per 0.1cc  Injections  Left flexor digitorum profundus 50 units (total of 3 sites) Left flexor digitorum superficialis 50 units (total of 3 sites) Left flexor carpi ulnaris 25 units (total of 1 site) Left flexor carpi radialis 25 units (total of 1 site) TOTAL UNITS:150 units  Agent: Botulinum Type A ( Onobotulinum Toxin type A ).2 vials of Botox were used, each containing 100 units and freshly diluted with 1 mL of sterile, non-preserved saline  Total injected (Units): 150 units Total wasted (Units): 50 units   Pt tolerated procedure well without complications. Reinjection is anticipated in 3 months.   II.Preoperative Diagnosis(es): Chronic migraine  Consent obtained from: The patient Benefits discussed included, but were not limited to decreased muscle tightness, increased joint range  of motion, and decreased pain.Risk discussed included, but were not limited pain and discomfort, bleeding, bruising, excessive weakness, venous thrombosis, muscle atrophy and dysphagia.A copy of the patient medication guide was given to the patient which explains the blackbox warning.  Patients identity and treatment sites confirmed Yes..  Details of Procedure: Skin was cleaned with alcohol.Prior to injection, the needle plunger was aspirated to make sure the needle was not within a blood vessel.There was no blood retrieved on aspiration.  Following is a summary of the muscles injectedAnd the amount of Botulinum toxin used:  Dilution 200 units were reconstituted with 4 mL 0.9% sodium chloride.Skin was cleaned with alcohol prior to injection.A 30-gauge, 0.5-inch needle was used.  Total:4 mL (200 units)  # of injection sitesMuscleDose Total 1 Corrugator 0.1 mL (5 units) 1 Procerus 0.2 mL (10 units) 2 each sideFrontalis0.4 mL (20 units) 4 each sideTemporalis0.8 mL (40 units) 3 each sideOccipitalis0.6 mL (30 units) 2 each sideCervical paraspinals0.4 mL (20 units) 3 each sideTrapezius0.6 mL (30 units)  Total 31Totalused155 units Total wasted45 units   Pt tolerated procedure well without complications. Reinjection is anticipated in 3 months. Return to clinic:  January.  [name], DO

## 2016-02-21 ENCOUNTER — Other Ambulatory Visit: Payer: Self-pay | Admitting: Physician Assistant

## 2016-02-24 ENCOUNTER — Encounter: Payer: Self-pay | Admitting: Physician Assistant

## 2016-02-25 ENCOUNTER — Telehealth: Payer: Self-pay | Admitting: Emergency Medicine

## 2016-02-25 MED ORDER — LORAZEPAM 0.5 MG PO TABS: 0.5000 mg | ORAL_TABLET | Freq: Two times a day (BID) | ORAL | 1 refills | Status: DC | PRN

## 2016-02-25 MED ORDER — CYCLOBENZAPRINE HCL 10 MG PO TABS: 10.0000 mg | ORAL_TABLET | Freq: Three times a day (TID) | ORAL | 1 refills | Status: DC | PRN

## 2016-02-25 NOTE — Telephone Encounter (Signed)
Advised patient by Mychart that her rx were sent to the pharmacy. Faxed the Ativan.

## 2016-02-25 NOTE — Telephone Encounter (Signed)
Flexeril refill sent.  Ativan refill printed. Please fax in for patient.

## 2016-02-25 NOTE — Telephone Encounter (Signed)
Patient sent a My Chart refill of the Cyclobenzaprine and Ativan. Flexeril last filled 12/24/15 #90 1 Ativan last filled 12/18/15 #60 1 Last ov: 12/29/15 for abd pain No pending appointment Please advise of refill

## 2016-03-08 ENCOUNTER — Ambulatory Visit: Payer: Medicare Other | Admitting: Neurology

## 2016-03-10 ENCOUNTER — Ambulatory Visit: Payer: Medicare Other | Admitting: Psychology

## 2016-03-24 ENCOUNTER — Other Ambulatory Visit: Payer: Self-pay | Admitting: Family Medicine

## 2016-03-28 ENCOUNTER — Ambulatory Visit: Payer: Medicare Other | Admitting: Psychology

## 2016-04-08 ENCOUNTER — Ambulatory Visit: Payer: Self-pay | Admitting: Physician Assistant

## 2016-04-11 ENCOUNTER — Telehealth: Payer: Self-pay | Admitting: Physician Assistant

## 2016-04-11 ENCOUNTER — Ambulatory Visit (INDEPENDENT_AMBULATORY_CARE_PROVIDER_SITE_OTHER): Payer: Medicare Other | Admitting: Physician Assistant

## 2016-04-11 ENCOUNTER — Encounter: Payer: Self-pay | Admitting: Physician Assistant

## 2016-04-11 VITALS — BP 118/80 | HR 106 | Temp 98.3°F | Resp 14 | Ht 60.0 in | Wt 175.0 lb

## 2016-04-11 DIAGNOSIS — S93402A Sprain of unspecified ligament of left ankle, initial encounter: Secondary | ICD-10-CM

## 2016-04-11 MED ORDER — ALBUTEROL SULFATE HFA 108 (90 BASE) MCG/ACT IN AERS: 2.0000 | INHALATION_SPRAY | Freq: Four times a day (QID) | RESPIRATORY_TRACT | 12 refills | Status: AC | PRN

## 2016-04-11 MED ORDER — DICLOFENAC SODIUM 1 % TD GEL: TRANSDERMAL | 0 refills | Status: DC

## 2016-04-11 MED ORDER — PROMETHAZINE HCL 25 MG PO TABS: 25.0000 mg | ORAL_TABLET | Freq: Four times a day (QID) | ORAL | 0 refills | Status: DC | PRN

## 2016-04-11 NOTE — Patient Instructions (Signed)
Please wear ankle brace daily as directed. Elevate foot while resting.  Apply heating pad to the area for 10 minutes at a time, a few times per day. Apply the topical pain cream to the affected area. Continue your daily aspirin.  Please follow-up if symptoms are not resolving.

## 2016-04-11 NOTE — Progress Notes (Signed)
Patient with history of fibromuscular dyspaiisa presents to clinic today c/o 2 weeks of pain L foot and ankle. Endorses 2 days after onset of pain, noted swelling to the area with minimal erythema that has since resolved. Pain averages about 5-6 and worsens at night to about 7/10. Marland Kitchen. Denies trauma or injury. Does note that she rolls her left ankle chronically due to her neurologic history.  Also endorses some spasms of her foot/ankle. Has taken one of her husband's Tramadol.   Past Medical History:  Diagnosis Date  . Anxiety   . Asthma   . Chest pain    a. 10/2014 CTA Chest: no dissection.  . Depression   . Dysarthria    Left arm  . Fibromuscular dysplasia (HCC)   . High cholesterol   . History of stroke    a. 04/2014 MRI Brain: mod large chronic right MCA territory, chronic right cervical ICA dissection, smooth irregularity of the distal L cervical ICA consistent w/ underlying FMD, occlusion of multiple R MCA branch vessels in area of chronic infarct.   . Left hemiparesis (HCC)   . Migraine     Current Outpatient Prescriptions on File Prior to Visit  Medication Sig Dispense Refill  . albuterol (PROAIR HFA) 108 (90 BASE) MCG/ACT inhaler Inhale 2 puffs into the lungs every 6 (six) hours as needed for wheezing or shortness of breath. 1 Inhaler 12  . amitriptyline (ELAVIL) 25 MG tablet TAKE 1 TABLET(25 MG) BY MOUTH AT BEDTIME 30 tablet 3  . Ascorbic Acid (VITAMIN C) 1000 MG tablet Take 1,000 mg by mouth daily. Reported on 07/27/2015    . aspirin 325 MG tablet Take 325 mg by mouth daily.    Marland Kitchen. atorvastatin (LIPITOR) 20 MG tablet TAKE ONE TABLET BY MOUTH ONCE DAILY 30 tablet 5  . cetirizine (ZYRTEC) 10 MG tablet TAKE ONE TABLET BY MOUTH ONCE DAILY 90 tablet 1  . cyclobenzaprine (FLEXERIL) 10 MG tablet Take 1 tablet (10 mg total) by mouth 3 (three) times daily as needed. for muscle spams 90 tablet 1  . LORazepam (ATIVAN) 0.5 MG tablet Take 1 tablet (0.5 mg total) by mouth 2 (two) times daily as  needed for anxiety. 60 tablet 1  . omeprazole (PRILOSEC) 20 MG capsule TAKE 1 CAPSULE(20 MG) BY MOUTH  DAILY BEFORE A MEAL 30 capsule 6  . venlafaxine XR (EFFEXOR-XR) 150 MG 24 hr capsule TAKE 1 CAPSULE(150 MG) BY MOUTH DAILY WITH BREAKFAST 30 capsule 5  . fluticasone-salmeterol (ADVAIR HFA) 115-21 MCG/ACT inhaler Inhale 2 puffs into the lungs 2 (two) times daily. (Patient not taking: Reported on 04/11/2016) 1 Inhaler 12   No current facility-administered medications on file prior to visit.     Allergies  Allergen Reactions  . Duloxetine Hcl Rash    hallucinations  . Acetaminophen Hives  . Fish Allergy     Hives and migraines  . Hydromorphone Other (See Comments)    Could not speak or keep her eyes open  . Ondansetron Nausea And Vomiting  . Tylenol [Acetaminophen] Hives    Family History  Problem Relation Age of Onset  . Cancer Mother   . Aortic aneurysm Father     Social History   Social History  . Marital status: Married    Spouse name: Tammy SoursGreg   . Number of children: 3  . Years of education: 10 th   Occupational History  .      Disabled   Social History Main Topics  . Smoking status:  Former Smoker    Quit date: 02/22/2011  . Smokeless tobacco: Never Used     Comment: Quit 2013  . Alcohol use No  . Drug use: Yes    Types: Marijuana  . Sexual activity: Not Asked   Other Topics Concern  . None   Social History Narrative   Patient lives at home with her husband [name].   Disabled.   Education 10 th   Right handed.   Caffeine six cup of coffee daily.     Review of Systems - See HPI.  All other ROS are negative.  BP 118/80   Pulse (!) 106   Temp 98.3 F (36.8 C) (Oral)   Resp 14   Ht 5' (1.524 m)   Wt 175 lb (79.4 kg)   SpO2 99%   BMI 34.18 kg/m   Physical Exam  Constitutional: She is well-developed, well-nourished, and in no distress.  HENT:  Head: Normocephalic and atraumatic.  Eyes: Conjunctivae are normal.  Cardiovascular: Normal rate, regular  rhythm, normal heart sounds and intact distal pulses.   Pulmonary/Chest: Effort normal and breath sounds normal. No respiratory distress. She has no wheezes. She has no rales. She exhibits no tenderness.  Musculoskeletal:       Left ankle: She exhibits normal range of motion and no swelling. Tenderness. Lateral malleolus and AITFL tenderness found. Achilles tendon normal.  2/5 strength with plantarflexion of L foot. 5/5 strength with dorsiflexion of L foot.   Skin: Skin is warm and dry. No rash noted.  Psychiatric: Affect normal.  Vitals reviewed.  Assessment/Plan: 1. Sprain of left ankle, unspecified ligament, initial encounter Brace applied. RICE. Patient allergic to Tylenol. NSAIDS limited due to history of gastritis. Rx Topical Voltaren. Supportive measures reviewed. Hydrate rest. Exercises discussed. FU if not resolving.   Piedad Climes, PA-C

## 2016-04-11 NOTE — Telephone Encounter (Signed)
Spoke with pharmacist to verify patient dosage of Voltaren gel. Per Selena Battenody patient is to use 1 gram twice daily in the area for pain.

## 2016-04-11 NOTE — Telephone Encounter (Signed)
Patient was prescribed Diclosenac. What is the dosage? Please cal pharmacy to advise.   Thank you  [facility] [address] - [place], [place] - [phone] (Phone) [phone] (Fax)

## 2016-04-11 NOTE — Progress Notes (Signed)
Pre visit review using our clinic review tool, if applicable. No additional management support is needed unless otherwise documented below in the visit note. 

## 2016-04-12 ENCOUNTER — Telehealth: Payer: Self-pay | Admitting: *Deleted

## 2016-04-12 NOTE — Telephone Encounter (Signed)
Received Prior authorization request for Diclofenac 1% Gel -   Prior Authorization completed and immediate approval given.   Approval through 02/20/17  Information sent back to Pharmacy.

## 2016-04-21 ENCOUNTER — Other Ambulatory Visit: Payer: Self-pay | Admitting: Physician Assistant

## 2016-04-29 ENCOUNTER — Encounter: Payer: Self-pay | Admitting: Physician Assistant

## 2016-04-29 ENCOUNTER — Telehealth: Payer: Self-pay | Admitting: Neurology

## 2016-04-29 ENCOUNTER — Ambulatory Visit (INDEPENDENT_AMBULATORY_CARE_PROVIDER_SITE_OTHER): Payer: Medicare Other | Admitting: Neurology

## 2016-04-29 DIAGNOSIS — G43709 Chronic migraine without aura, not intractable, without status migrainosus: Secondary | ICD-10-CM

## 2016-04-29 DIAGNOSIS — G8114 Spastic hemiplegia affecting left nondominant side: Secondary | ICD-10-CM | POA: Diagnosis not present

## 2016-04-29 DIAGNOSIS — G43801 Other migraine, not intractable, with status migrainosus: Secondary | ICD-10-CM | POA: Diagnosis not present

## 2016-04-29 MED ORDER — AMITRIPTYLINE HCL 25 MG PO TABS: ORAL_TABLET | ORAL | 5 refills | Status: DC

## 2016-04-29 MED ORDER — DICLOFENAC POTASSIUM(MIGRAINE) 50 MG PO PACK: PACK | ORAL | 2 refills | Status: DC

## 2016-04-29 MED ORDER — ONABOTULINUMTOXINA 100 UNITS IJ SOLR
305.0000 [IU] | Freq: Once | INTRAMUSCULAR | Status: AC
  Administered 2016-04-29: 305 [IU] via INTRAMUSCULAR

## 2016-04-29 NOTE — Progress Notes (Signed)
Procedure Note:BOTOX  Attending: Dr. 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  Patients identity and treatment sites confirmed Yes..  Details of Procedure: Skin was cleaned with alcohol.A hollow monopolor needle was introduced to the target muscle.Prior to injection, the needle plunger was aspirated to make sure the needle was not within a blood vessel.There was no blood retrieved on aspiration.  Following is a summary of the muscles injectedAnd the amount of Botulinum toxin used:   Dilution 0.9% preservative free saline mixed with 100 u Botox type A to make 10 U per 0.1cc  Injections  Left flexor digitorum profundus 50 units (total of 3 sites) Left flexor digitorum superficialis 50 units (total of 3 sites) Left flexor carpi ulnaris 25 units (total of 1 site) Left flexor carpi radialis 25 units (total of 1 site) TOTAL UNITS:150 units  Agent: Botulinum Type A ( Onobotulinum Toxin type A ).2 vials of Botox were used, each containing 100 units and freshly diluted with 1 mL of sterile, non-preserved saline  Total injected (Units): 150 units Total wasted (Units): None   Pt tolerated procedure well without complications. Reinjection is anticipated in 3 months.   II.Preoperative Diagnosis(es): Chronic migraine  Consent obtained from: The patient Benefits discussed included, but were not limited to decreased muscle tightness, increased joint range of  motion, and decreased pain.Risk discussed included, but were not limited pain and discomfort, bleeding, bruising, excessive weakness, venous thrombosis, muscle atrophy and dysphagia.A copy of the patient medication guide was given to the patient which explains the blackbox warning.  Patients identity and treatment sites confirmed Yes..  Details of Procedure: Skin was cleaned with alcohol.Prior to injection, the needle plunger was aspirated to make sure the needle was not within a blood vessel.There was no blood retrieved on aspiration.  Following is a summary of the muscles injectedAnd the amount of Botulinum toxin used:  Dilution 200 units were reconstituted with 4 mL 0.9% sodium chloride.Skin was cleaned with alcohol prior to injection.A 30-gauge, 0.5-inch needle was used.  Total:4 mL (200 units)  # of injection sitesMuscleDose Total 1 Corrugator 0.1 mL (5 units) 1 Procerus 0.2 mL (10 units) 2 each sideFrontalis0.4 mL (20 units) 4 each sideTemporalis0.8 mL (40 units) 3 each sideOccipitalis0.6 mL (30 units) 2 each sideCervical paraspinals0.4 mL (20 units) 3 each sideTrapezius0.6 mL (30 units)  Total 31Totalused155 units Total wastedNone   Pt tolerated procedure well without complications. Reinjection is anticipated in 3 months.

## 2016-04-29 NOTE — Telephone Encounter (Signed)
Patient husband Tammy SoursGreg called and state that the medication is over 500.00 and would like to talk to someone please call [phone] about getting something different

## 2016-05-02 ENCOUNTER — Other Ambulatory Visit: Payer: Self-pay | Admitting: Physician Assistant

## 2016-05-02 ENCOUNTER — Other Ambulatory Visit: Payer: Self-pay | Admitting: Emergency Medicine

## 2016-05-02 DIAGNOSIS — M79672 Pain in left foot: Secondary | ICD-10-CM

## 2016-05-02 MED ORDER — VENLAFAXINE HCL ER 150 MG PO CP24: ORAL_CAPSULE | ORAL | 5 refills | Status: DC

## 2016-05-02 NOTE — Telephone Encounter (Signed)
Courtney Garza is too expensive. Please advise on alternative.

## 2016-05-03 ENCOUNTER — Ambulatory Visit (HOSPITAL_BASED_OUTPATIENT_CLINIC_OR_DEPARTMENT_OTHER)
Admission: RE | Admit: 2016-05-03 | Discharge: 2016-05-03 | Disposition: A | Discharge status: Home / Self Care | Payer: Medicare Other | Source: Ambulatory Visit | Attending: Physician Assistant | Admitting: Physician Assistant

## 2016-05-03 DIAGNOSIS — M7989 Other specified soft tissue disorders: Secondary | ICD-10-CM | POA: Insufficient documentation

## 2016-05-03 DIAGNOSIS — M79672 Pain in left foot: Secondary | ICD-10-CM | POA: Insufficient documentation

## 2016-05-03 MED ORDER — KETOPROFEN 75 MG PO CAPS: 75.0000 mg | ORAL_CAPSULE | Freq: Three times a day (TID) | ORAL | 0 refills | Status: DC | PRN

## 2016-05-03 NOTE — Telephone Encounter (Signed)
Ketoprofen 75mg  every 8 hours as needed for headache, 30 pills.

## 2016-05-03 NOTE — Telephone Encounter (Signed)
Spoke to spouse advise Rx sent to pharm on file. Spouse was grateful.

## 2016-05-04 ENCOUNTER — Other Ambulatory Visit: Payer: Self-pay | Admitting: Physician Assistant

## 2016-05-04 DIAGNOSIS — M25475 Effusion, left foot: Secondary | ICD-10-CM

## 2016-05-05 NOTE — Telephone Encounter (Signed)
Submitted PA for Ketoprofen per faxed reqst from pharm Key#[id number]; Submitted on 03/14

## 2016-05-09 NOTE — Telephone Encounter (Signed)
Called pt informed Ketoprofen 75mg  on long term backorder. Will have physician to advise about alternate Rx

## 2016-05-09 NOTE — Telephone Encounter (Signed)
Complex headache history. Recommend discussing medication options with Dr. Everlena CooperJaffe, he will be back in the office next week.

## 2016-05-09 NOTE — Telephone Encounter (Signed)
Called patient and advised per Dr. Eliane DecreePatel's previous notes. Patient verbalized understanding.

## 2016-05-15 NOTE — Telephone Encounter (Signed)
She can try diclofenac potassium 50mg .  Take 1 to 2 pills at earliest onset of headache (not to exceed 2 pills in 24 hours).  She should limit use to no more than 2 days out of the week.

## 2016-05-17 ENCOUNTER — Other Ambulatory Visit: Payer: Self-pay | Admitting: Emergency Medicine

## 2016-05-17 MED ORDER — ATORVASTATIN CALCIUM 20 MG PO TABS: 20.0000 mg | ORAL_TABLET | Freq: Every day | ORAL | 1 refills | Status: DC

## 2016-05-18 MED ORDER — DICLOFENAC POTASSIUM 50 MG PO TABS: ORAL_TABLET | ORAL | 0 refills | Status: AC

## 2016-05-18 NOTE — Telephone Encounter (Signed)
Called pt to inform think when I called previously Dr. Everlena CooperJaffe wanted to start diclofenac potassium she quoted the price for the powder form. Diclofenac potassium pill form should not be as expensive. Made Dr. Everlena CooperJaffe aware if unable to afford will try prochlorperazine.  Sent Rx. No answer. Left vmail.

## 2016-05-18 NOTE — Telephone Encounter (Signed)
Let's try prochlorperazine 10mg  for acute migraine every 8 hours as needed, #30

## 2016-05-18 NOTE — Addendum Note (Signed)
Addended by: Doree BarthelLOWE, Kimberely Mccannon on: 05/18/2016 11:45 AM   Modules accepted: Orders

## 2016-05-18 NOTE — Telephone Encounter (Signed)
Patient states diclofenac cost is too expensive; $535 w/ insurance. Do you want to try and alternative or try to get PA for diclofenac?

## 2016-05-24 ENCOUNTER — Other Ambulatory Visit: Payer: Self-pay | Admitting: Emergency Medicine

## 2016-05-24 MED ORDER — LORAZEPAM 0.5 MG PO TABS: 0.5000 mg | ORAL_TABLET | Freq: Two times a day (BID) | ORAL | 1 refills | Status: DC | PRN

## 2016-05-24 NOTE — Telephone Encounter (Signed)
Last filled 02/25/16 #60 1 RF CSC: 07/08/14 UDS: 07/08/14 Please advise

## 2016-05-24 NOTE — Telephone Encounter (Signed)
Faxed to [facility]

## 2016-05-25 ENCOUNTER — Ambulatory Visit: Payer: Self-pay | Admitting: Family Medicine

## 2016-06-22 ENCOUNTER — Other Ambulatory Visit: Payer: Self-pay | Admitting: Physician Assistant

## 2016-06-27 ENCOUNTER — Ambulatory Visit (INDEPENDENT_AMBULATORY_CARE_PROVIDER_SITE_OTHER): Payer: Medicare Other | Admitting: Neurology

## 2016-06-27 ENCOUNTER — Encounter: Payer: Self-pay | Admitting: Neurology

## 2016-06-27 VITALS — BP 100/62 | HR 109 | Ht 60.0 in | Wt 173.0 lb

## 2016-06-27 DIAGNOSIS — I7771 Dissection of carotid artery: Secondary | ICD-10-CM

## 2016-06-27 DIAGNOSIS — G43709 Chronic migraine without aura, not intractable, without status migrainosus: Secondary | ICD-10-CM

## 2016-06-27 DIAGNOSIS — I69354 Hemiplegia and hemiparesis following cerebral infarction affecting left non-dominant side: Secondary | ICD-10-CM | POA: Diagnosis not present

## 2016-06-27 MED ORDER — CYCLOBENZAPRINE HCL 5 MG PO TABS: 15.0000 mg | ORAL_TABLET | Freq: Three times a day (TID) | ORAL | 5 refills | Status: AC | PRN

## 2016-06-27 NOTE — Progress Notes (Addendum)
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  She is accompanied by her husband who provides some history.   UPDATE: Migraines have been well-controlled. Intensity:  5-6/10 Duration:  Until she falls asleep Frequency:  5 days per month Current NSAIDS:  diclofenac 50-100mg  (takes as needed) Current analgesics:  no Current triptans:  no Current anti-emetic:  promethazine 25mg  PO Current muscle relaxants:  cyclobenzaprine 10mg  three times daily Current anti-anxiolytic:  lorazepam Current sleep aide:  no Current Antihypertensive medications:  no Current Antidepressant medications:  amitriptyline 25mg  (depression), venlafaxine XR 150mg  (depression) Current Anticonvulsant medications:  no Current Vitamins/Herbal/Supplements:  no Current Antihistamines/Decongestants:  no  Other therapy:  Botox  Left upper extremity spasticity worse about 2 months post Botox.  She had fractured her wrist and was in a cast for 6 weeks, which made it worse.  She also reports that the toes in her left foot curl and spasm at times.   HISTORY: [place]  [facility], where she was receiving Botox for her migraines and spasticity.  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  The persistent headaches were attributed to  the SDH.  She told me last visit she received Botox, which helped the migraines.  Her last round of Botox was in May.  However, reviewing the [facility] at [facility], she seemed to have been receiving occipital nerve blocks but there is no mention of Botox for her migraines.  She reportedly was getting Botox for her left sided spasticity. Onset:  All her life, however worse since her stroke in 2012 Location:  Right occipital spreading to frontal region Quality:  aching, burning? Initial Intensity:  7-8/10 Aura:  Occasionally "disturbed vision" (cannot focus) Prodrome:  no Associated symptoms:  Nausea, vomiting, photophobia, phonophobia, osmophobia, runny nose Initial Duration:  constant Initial Frequency:  constant Triggers/exacerbating factors:  fish Relieving factors:  none Activity:  Can't function 20 days out of the month.   Past NSAIDS:  Advil/Aleve ineffective, Cambia (expense), Mobic Past analgesics:  Tylenol (hives), Excedrin (hives) Past triptans:  contraindicated due to FMD with stroke Past antiemetic:  no Past muscle relaxant:  baclofen Past antihistamine:  cyproheptadine  Past antihypertensives:  no Past antidepressants:  Cymbalta (altered mental status), citalopram Past anticonvulsants:  Depakote (was only on it briefly, started in hospital but quickly changed as outpatient), topiramate (vomiting), gabapentin Other past therapy:  right greater occipital nerve block (effective for 6 weeks)   Caffeine:  6 cups coffee daily Alcohol:  no Smoker:  former Diet:  healthy Exercise:  Walks daily Depression/stress:  stable Sleep hygiene:  good Family history of headache:  Dad   She had a right hemispheric stroke related to a carotid dissection due to fibromuscular dysplasia in 2012.  She has residual left spastic hemiparesis.  She was bedridden for two months and developed a DVT, requiring an  IVC filter.  She reportedly was found to have protein C and S deficiency.  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  She has established care with Dr. Shawnee Knapp at [facility] for her FMD.  She takes ASA 325mg  daily.  PAST MEDICAL HISTORY: Past Medical History:  Diagnosis Date  . Anxiety   . Asthma   . Chest pain    a. 10/2014 CTA Chest: no dissection.  . Depression   . Dysarthria    Left arm  . Fibromuscular dysplasia (HCC)   . High cholesterol   . History of stroke    a. 04/2014 MRI Brain: mod large chronic right MCA territory, chronic right cervical ICA dissection, smooth irregularity of the distal L cervical ICA consistent w/ underlying FMD, occlusion of multiple R MCA branch vessels in area of chronic infarct.   . Left hemiparesis (HCC)   . Migraine     MEDICATIONS: Current Outpatient Prescriptions on File Prior to Visit  Medication Sig Dispense Refill  . albuterol (PROAIR HFA) 108 (90 Base) MCG/ACT inhaler Inhale 2 puffs into the lungs every 6 (six) hours as needed for wheezing or shortness of breath. 1 Inhaler 12  . amitriptyline (ELAVIL) 25 MG tablet TAKE 1 TABLET(25 MG) BY MOUTH AT BEDTIME 30 tablet 5  . Ascorbic Acid (VITAMIN C) 1000 MG tablet Take 1,000 mg by mouth daily. Reported on 07/27/2015    . aspirin 325 MG tablet Take 325 mg by mouth daily.    Marland Kitchen atorvastatin (LIPITOR) 20 MG tablet Take 1 tablet (20 mg total) by mouth daily. 90 tablet 1  . cetirizine (ZYRTEC) 10 MG tablet TAKE ONE TABLET BY MOUTH ONCE DAILY 90 tablet 1  . diclofenac (CATAFLAM) 50 MG tablet Take 1-2 tablets @ earliest onset of headache (not to exceed 2 tablets in 24hrs) 60 tablet 0  . ketoprofen (ORUDIS) 75 MG capsule Take 1 capsule (75 mg total) by mouth every 8 (eight) hours as needed. 30 capsule 0  . LORazepam (ATIVAN) 0.5 MG tablet Take 1 tablet (0.5 mg total) by mouth 2 (two) times daily as needed for anxiety. 60 tablet 1  .  omeprazole (PRILOSEC) 20 MG capsule TAKE 1 CAPSULE(20 MG) BY MOUTH  DAILY BEFORE A MEAL 30 capsule 6  . promethazine (PHENERGAN) 25 MG tablet Take 1 tablet (25 mg total) by mouth every 6 (six) hours as needed for nausea or vomiting. 30 tablet 0  . venlafaxine XR (EFFEXOR-XR) 150 MG 24 hr capsule TAKE 1 CAPSULE(150 MG) BY MOUTH DAILY WITH BREAKFAST 30 capsule 5   No current facility-administered medications on file prior to visit.     ALLERGIES: Allergies  Allergen Reactions  . Duloxetine Hcl Rash    hallucinations  . Acetaminophen Hives  . Fish Allergy     Hives and migraines  . Hydromorphone Other (See Comments)    Could not speak or keep her eyes open  . Ondansetron Nausea And Vomiting  . Tylenol [Acetaminophen] Hives    FAMILY HISTORY: Family History  Problem Relation Age of Onset  . Cancer Mother   . Aortic aneurysm Father     SOCIAL HISTORY: Social History   Social History  . Marital status: Married    Spouse name: [name]   . Number of children: 3  . Years of education: 10 th   Occupational History  .  Disabled   Social History Main Topics  . Smoking status: Former Smoker    Quit date: 02/22/2011  . Smokeless tobacco: Never Used     Comment: Quit 2013  . Alcohol use No  . Drug use: Yes    Types: Marijuana  . Sexual activity: Not on file   Other Topics Concern  . Not on file   Social History Narrative   Patient lives at home with her husband [name].   Disabled.   Education 10 th   Right handed.   Caffeine six cup of coffee daily.      REVIEW OF SYSTEMS: Constitutional: No fevers, chills, or sweats, no generalized fatigue, change in appetite Eyes: No visual changes, double vision, eye pain Ear, nose and throat: No hearing loss, ear pain, nasal congestion, sore throat Cardiovascular: No chest pain, palpitations Respiratory:  No shortness of breath at rest or with exertion, wheezes GastrointestinaI: No nausea, vomiting, diarrhea, abdominal pain,  fecal incontinence Genitourinary:  No dysuria, urinary retention or frequency Musculoskeletal:  No neck pain, back pain Integumentary: No rash, pruritus, skin lesions Neurological: as above Psychiatric: No depression, insomnia, anxiety Endocrine: No palpitations, fatigue, diaphoresis, mood swings, change in appetite, change in weight, increased thirst Hematologic/Lymphatic:  No purpura, petechiae. Allergic/Immunologic: no itchy/runny eyes, nasal congestion, recent allergic reactions, rashes  PHYSICAL EXAM: Vitals:   06/27/16 0940  BP: 100/62  Pulse: (!) 109   General: No acute distress.  Patient appears well-groomed.  normal body habitus. Head:  Normocephalic/atraumatic Eyes:  Fundi examined but not visualized Neck: supple, no paraspinal tenderness, full range of motion Heart:  Regular rate and rhythm Lungs:  Clear to auscultation bilaterally Back: No paraspinal tenderness Neurological Exam: alert and oriented to person, place, and time. Attention span and concentration intact, recent and remote memory intact, fund of knowledge intact.  Speech fluent and not dysarthric, language intact.  Right pupil 1mm larger than left pupil and sluggishly reactive.  Left lower facial weakness.  Bilateral peripheral vision loss.  Left shoulder shrug weakness.  Otherwise, CN II-XII intact. Fundoscopic exam unremarkable without vessel changes, exudates, hemorrhages or papilledema.  Left upper extremity spasticity with wrist and fingers flexed,, left lower extremity increased tone.  Left upper extremity plegia, left quad 5-/5, except 4+/5 left extensor hallucis longus.  Hyperesthesia to light touch in left hand.  Deep tendon reflexes 3+ on left, 2+ on right.  Finger to nose  testing intact on right (unable to test left).  Gait with left circumduction..  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 Hendriks has had greater than 50% reduction in frequency of her migraines (from daily to 5 days per month). Fibromuscular dysplasia  PLAN: 1.  ASA 325mg  daily for secondary stroke prevention 2.  For migraine preventative:  Botox, venlafaxine XR 150mg , amitriptyline 25mg .  Diclofenac for abortive therapy.  3.  For spasticity:  Botox.  Will try increasing dosage in order to achieve better coverage than 2 months..  Will increase cyclobenzaprine to 15mg  three times daily 4.  Follow up for Botox next month.  25 minutes spent face to face with patient, over 50% spent discussing management.  Shon Millet, DO  CC:  Waldon Merl, PA-C

## 2016-06-27 NOTE — Patient Instructions (Signed)
1.  Increase cyclobenzaprine to 15mg  three times daily 2.  Follow up for Botox next month

## 2016-07-25 ENCOUNTER — Other Ambulatory Visit: Payer: Self-pay | Admitting: Emergency Medicine

## 2016-07-25 MED ORDER — LORAZEPAM 0.5 MG PO TABS: 0.5000 mg | ORAL_TABLET | Freq: Two times a day (BID) | ORAL | 0 refills | Status: DC | PRN

## 2016-07-25 NOTE — Telephone Encounter (Signed)
Lorazepam last filled 05/24/16 #60 1 RF Last office visit: 04/11/16 CSC: 07/08/14 UDS: 07/08/14  Please advise in PCP absence

## 2016-07-25 NOTE — Telephone Encounter (Signed)
Rx faxed to pharmacy  

## 2016-07-25 NOTE — Telephone Encounter (Signed)
Ok for #60, no refills 

## 2016-07-26 ENCOUNTER — Other Ambulatory Visit: Payer: Self-pay | Admitting: Family Medicine

## 2016-07-27 ENCOUNTER — Other Ambulatory Visit: Payer: Self-pay | Admitting: Emergency Medicine

## 2016-07-27 ENCOUNTER — Telehealth: Payer: Self-pay | Admitting: Neurology

## 2016-07-27 MED ORDER — PROMETHAZINE HCL 25 MG PO TABS: 25.0000 mg | ORAL_TABLET | Freq: Four times a day (QID) | ORAL | 0 refills | Status: AC | PRN

## 2016-07-27 NOTE — Telephone Encounter (Signed)
LM stating you will receive a phone call from [facility] for Botox delivery here for 6/13. They are located in [place]. Explained her insurance required the Botox be purchased through them. I gave her [facility] number [phone] and told her to call them if she has not heard from them by Friday which is what I was told and that it was put in as urgent. I left our phone number and urged her to call us if she has not heard from them by then. I explained she will need to be ready to pay a copay if required before they will send.

## 2016-07-27 NOTE — Telephone Encounter (Signed)
Requesting: promethazine (PHENERGAN) 25 MG tablet Last OV:  Last Refill:   Please Advise

## 2016-07-27 NOTE — Telephone Encounter (Signed)
Sent to provider 

## 2016-08-01 ENCOUNTER — Telehealth: Payer: Self-pay | Admitting: Physician Assistant

## 2016-08-01 DIAGNOSIS — Z1239 Encounter for other screening for malignant neoplasm of breast: Secondary | ICD-10-CM

## 2016-08-01 NOTE — Telephone Encounter (Signed)
Patient request order for Mammogram. Plse adv

## 2016-08-01 NOTE — Telephone Encounter (Signed)
Order has been placed. She will be contacted to schedule. Please make patient aware. Thank you.

## 2016-08-02 ENCOUNTER — Encounter: Payer: Self-pay | Admitting: Emergency Medicine

## 2016-08-02 NOTE — Telephone Encounter (Signed)
My chart message sent to notify of mammogram order

## 2016-08-05 ENCOUNTER — Ambulatory Visit (INDEPENDENT_AMBULATORY_CARE_PROVIDER_SITE_OTHER): Payer: Medicare Other | Admitting: Neurology

## 2016-08-05 DIAGNOSIS — G43709 Chronic migraine without aura, not intractable, without status migrainosus: Secondary | ICD-10-CM | POA: Diagnosis not present

## 2016-08-05 DIAGNOSIS — G8114 Spastic hemiplegia affecting left nondominant side: Secondary | ICD-10-CM

## 2016-08-05 DIAGNOSIS — IMO0002 Reserved for concepts with insufficient information to code with codable children: Secondary | ICD-10-CM

## 2016-08-05 MED ORDER — ONABOTULINUMTOXINA 100 UNITS IJ SOLR
355.0000 [IU] | Freq: Once | INTRAMUSCULAR | Status: AC
  Administered 2016-08-05: 355 [IU] via INTRAMUSCULAR

## 2016-08-05 NOTE — Progress Notes (Signed)
Procedure Note:BOTOX  Attending: Dr. 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  Patients identity and treatment sites confirmed Yes..  Details of Procedure: Skin was cleaned with alcohol.A hollow monopolor needle was introduced to the target muscle.Prior to injection, the needle plunger was aspirated to make sure the needle was not within a blood vessel.There was no blood retrieved on aspiration.  Following is a summary of the muscles injectedAnd the amount of Botulinum toxin used:   Dilution 0.9% preservative free saline mixed with 100 u Botox type A to make 10 U per 0.1cc  Injections  Left flexor digitorum profundus 75 units (total of 3 sites) Left flexor digitorum superficialis 50 units (total of 3 sites) Left flexor carpi ulnaris 25 units (total of 1 site) Left flexor carpi radialis 75 units (total of 1 site) TOTAL UNITS:200 units  Agent: Botulinum Type A ( Onobotulinum Toxin type A ).2 vials of Botox were used, each containing 100 units and freshly diluted with 1 mL of sterile, non-preserved saline  Total injected (Units): 200 units Total wasted (Units): None   Pt tolerated procedure well without complications. Reinjection is anticipated in 3 months.   II.Preoperative Diagnosis(es): Chronic migraine  Consent obtained from: The patient Benefits discussed included, but were not limited to decreased muscle tightness, increased joint range of  motion, and decreased pain.Risk discussed included, but were not limited pain and discomfort, bleeding, bruising, excessive weakness, venous thrombosis, muscle atrophy and dysphagia.A copy of the patient medication guide was given to the patient which explains the blackbox warning.  Patients identity and treatment sites confirmed Yes..  Details of Procedure: Skin was cleaned with alcohol.Prior to injection, the needle plunger was aspirated to make sure the needle was not within a blood vessel.There was no blood retrieved on aspiration.  Following is a summary of the muscles injectedAnd the amount of Botulinum toxin used:  Dilution 200 units were reconstituted with 4 mL 0.9% sodium chloride.Skin was cleaned with alcohol prior to injection.A 30-gauge, 0.5-inch needle was used.  Total:4 mL (200 units)  # of injection sitesMuscleDose Total 1 Corrugator 0.1 mL (5 units) 1 Procerus 0.2 mL (10 units) 2 each sideFrontalis0.4 mL (20 units) 4 each sideTemporalis0.8 mL (40 units) 3 each sideOccipitalis0.6 mL (30 units) 2 each sideCervical paraspinals0.4 mL (20 units) 3 each sideTrapezius0.6 mL (30 units)  Total 31Totalused155 units Total wastedNone   Pt tolerated procedure well without complications. Reinjection is anticipated in 3 months.  Courtney MilletAdam Deeksha Cotrell, DO

## 2016-08-16 ENCOUNTER — Encounter: Payer: Self-pay | Admitting: Physician Assistant

## 2016-08-18 ENCOUNTER — Other Ambulatory Visit: Payer: Self-pay | Admitting: Physician Assistant

## 2016-08-23 ENCOUNTER — Encounter: Payer: Medicare Other | Admitting: Physician Assistant

## 2016-08-31 ENCOUNTER — Ambulatory Visit (INDEPENDENT_AMBULATORY_CARE_PROVIDER_SITE_OTHER): Payer: Medicare Other | Admitting: Physician Assistant

## 2016-08-31 ENCOUNTER — Encounter: Payer: Self-pay | Admitting: Physician Assistant

## 2016-08-31 VITALS — BP 100/70 | HR 108 | Temp 98.3°F | Resp 14 | Ht 60.0 in | Wt 176.0 lb

## 2016-08-31 DIAGNOSIS — I773 Arterial fibromuscular dysplasia: Secondary | ICD-10-CM | POA: Diagnosis not present

## 2016-08-31 DIAGNOSIS — E7800 Pure hypercholesterolemia, unspecified: Secondary | ICD-10-CM | POA: Diagnosis not present

## 2016-08-31 DIAGNOSIS — Z1239 Encounter for other screening for malignant neoplasm of breast: Secondary | ICD-10-CM

## 2016-08-31 DIAGNOSIS — J4530 Mild persistent asthma, uncomplicated: Secondary | ICD-10-CM

## 2016-08-31 DIAGNOSIS — Z0000 Encounter for general adult medical examination without abnormal findings: Secondary | ICD-10-CM | POA: Diagnosis not present

## 2016-08-31 DIAGNOSIS — Z0001 Encounter for general adult medical examination with abnormal findings: Secondary | ICD-10-CM

## 2016-08-31 DIAGNOSIS — F32A Depression, unspecified: Secondary | ICD-10-CM

## 2016-08-31 DIAGNOSIS — F329 Major depressive disorder, single episode, unspecified: Secondary | ICD-10-CM | POA: Diagnosis not present

## 2016-08-31 DIAGNOSIS — G8114 Spastic hemiplegia affecting left nondominant side: Secondary | ICD-10-CM

## 2016-08-31 DIAGNOSIS — Z1231 Encounter for screening mammogram for malignant neoplasm of breast: Secondary | ICD-10-CM | POA: Diagnosis not present

## 2016-08-31 DIAGNOSIS — G7102 Facioscapulohumeral muscular dystrophy: Secondary | ICD-10-CM

## 2016-08-31 DIAGNOSIS — G710 Muscular dystrophy: Secondary | ICD-10-CM

## 2016-08-31 LAB — URINALYSIS, ROUTINE W REFLEX MICROSCOPIC
Bilirubin Urine: NEGATIVE
Hgb urine dipstick: NEGATIVE
Ketones, ur: NEGATIVE
Leukocytes, UA: NEGATIVE
Nitrite: NEGATIVE
RBC / HPF: NONE SEEN (ref 0–?)
Specific Gravity, Urine: 1.01 (ref 1.000–1.030)
Total Protein, Urine: NEGATIVE
Urine Glucose: NEGATIVE
Urobilinogen, UA: 0.2 (ref 0.0–1.0)
pH: 5.5 (ref 5.0–8.0)

## 2016-08-31 LAB — CBC WITH DIFFERENTIAL/PLATELET
Basophils Absolute: 0 10*3/uL (ref 0.0–0.1)
Basophils Relative: 0.6 % (ref 0.0–3.0)
Eosinophils Absolute: 0.1 10*3/uL (ref 0.0–0.7)
Eosinophils Relative: 1.7 % (ref 0.0–5.0)
HCT: 37.6 % (ref 36.0–46.0)
Hemoglobin: 12.7 g/dL (ref 12.0–15.0)
Lymphocytes Relative: 38.6 % (ref 12.0–46.0)
Lymphs Abs: 2.9 10*3/uL (ref 0.7–4.0)
MCHC: 33.8 g/dL (ref 30.0–36.0)
MCV: 91.8 fl (ref 78.0–100.0)
Monocytes Absolute: 0.4 10*3/uL (ref 0.1–1.0)
Monocytes Relative: 5.7 % (ref 3.0–12.0)
Neutro Abs: 4 10*3/uL (ref 1.4–7.7)
Neutrophils Relative %: 53.4 % (ref 43.0–77.0)
Platelets: 266 10*3/uL (ref 150.0–400.0)
RBC: 4.09 Mil/uL (ref 3.87–5.11)
RDW: 13.5 % (ref 11.5–15.5)
WBC: 7.5 10*3/uL (ref 4.0–10.5)

## 2016-08-31 LAB — COMPREHENSIVE METABOLIC PANEL
ALT: 11 U/L (ref 0–35)
AST: 15 U/L (ref 0–37)
Albumin: 3.7 g/dL (ref 3.5–5.2)
Alkaline Phosphatase: 100 U/L (ref 39–117)
BUN: 7 mg/dL (ref 6–23)
CO2: 26 mEq/L (ref 19–32)
Calcium: 9 mg/dL (ref 8.4–10.5)
Chloride: 104 mEq/L (ref 96–112)
Creatinine, Ser: 0.74 mg/dL (ref 0.40–1.20)
GFR: 89.53 mL/min (ref >60.00)
Glucose, Bld: 97 mg/dL (ref 70–99)
Potassium: 3.9 mEq/L (ref 3.5–5.1)
Sodium: 140 mEq/L (ref 135–145)
Total Bilirubin: 0.3 mg/dL (ref 0.2–1.2)
Total Protein: 6.5 g/dL (ref 6.0–8.3)

## 2016-08-31 LAB — HEMOGLOBIN A1C: Hgb A1c MFr Bld: 5.6 % (ref 4.6–6.5)

## 2016-08-31 LAB — TSH: TSH: 1.83 u[IU]/mL (ref 0.35–4.50)

## 2016-08-31 MED ORDER — VENLAFAXINE HCL ER 150 MG PO CP24: ORAL_CAPSULE | ORAL | 1 refills | Status: AC

## 2016-08-31 MED ORDER — FLUTICASONE PROPIONATE (INHAL) 100 MCG/BLIST IN AEPB: 100.0000 ug | INHALATION_SPRAY | Freq: Two times a day (BID) | RESPIRATORY_TRACT | 3 refills | Status: AC

## 2016-08-31 NOTE — Patient Instructions (Signed)
Please go to the lab for blood work.   Our office will call you with your results unless you have chosen to receive results via MyChart.  If your blood work is normal we will follow-up each year for physicals and as scheduled for chronic medical problems.  If anything is abnormal we will treat accordingly and get you in for a follow-up.  Please continue chronic medications as directed. Start the new medication -- Flovent -- twice daily.    Preventive Care 40-64 Years, Female Preventive care refers to lifestyle choices and visits with your health care provider that can promote health and wellness. What does preventive care include?  A yearly physical exam. This is also called an annual well check.  Dental exams once or twice a year.  Routine eye exams. Ask your health care provider how often you should have your eyes checked.  Personal lifestyle choices, including: ? Daily care of your teeth and gums. ? Regular physical activity. ? Eating a healthy diet. ? Avoiding tobacco and drug use. ? Limiting alcohol use. ? Practicing safe sex. ? Taking low-dose aspirin daily starting at age 20. ? Taking vitamin and mineral supplements as recommended by your health care provider. What happens during an annual well check? The services and screenings done by your health care provider during your annual well check will depend on your age, overall health, lifestyle risk factors, and family history of disease. Counseling Your health care provider may ask you questions about your:  Alcohol use.  Tobacco use.  Drug use.  Emotional well-being.  Home and relationship well-being.  Sexual activity.  Eating habits.  Work and work Statistician.  Method of birth control.  Menstrual cycle.  Pregnancy history.  Screening You may have the following tests or measurements:  Height, weight, and BMI.  Blood pressure.  Lipid and cholesterol levels. These may be checked every 5 years, or  more frequently if you are over 20 years old.  Skin check.  Lung cancer screening. You may have this screening every year starting at age 6 if you have a 30-pack-year history of smoking and currently smoke or have quit within the past 15 years.  Fecal occult blood test (FOBT) of the stool. You may have this test every year starting at age 41.  Flexible sigmoidoscopy or colonoscopy. You may have a sigmoidoscopy every 5 years or a colonoscopy every 10 years starting at age 92.  Hepatitis C blood test.  Hepatitis B blood test.  Sexually transmitted disease (STD) testing.  Diabetes screening. This is done by checking your blood sugar (glucose) after you have not eaten for a while (fasting). You may have this done every 1-3 years.  Mammogram. This may be done every 1-2 years. Talk to your health care provider about when you should start having regular mammograms. This may depend on whether you have a family history of breast cancer.  BRCA-related cancer screening. This may be done if you have a family history of breast, ovarian, tubal, or peritoneal cancers.  Pelvic exam and Pap test. This may be done every 3 years starting at age 105. Starting at age 57, this may be done every 5 years if you have a Pap test in combination with an HPV test.  Bone density scan. This is done to screen for osteoporosis. You may have this scan if you are at high risk for osteoporosis.  Discuss your test results, treatment options, and if necessary, the need for more tests with your health  care provider. Vaccines Your health care provider may recommend certain vaccines, such as:  Influenza vaccine. This is recommended every year.  Tetanus, diphtheria, and acellular pertussis (Tdap, Td) vaccine. You may need a Td booster every 10 years.  Varicella vaccine. You may need this if you have not been vaccinated.  Zoster vaccine. You may need this after age 17.  Measles, mumps, and rubella (MMR) vaccine. You may  need at least one dose of MMR if you were born in 1957 or later. You may also need a second dose.  Pneumococcal 13-valent conjugate (PCV13) vaccine. You may need this if you have certain conditions and were not previously vaccinated.  Pneumococcal polysaccharide (PPSV23) vaccine. You may need one or two doses if you smoke cigarettes or if you have certain conditions.  Meningococcal vaccine. You may need this if you have certain conditions.  Hepatitis A vaccine. You may need this if you have certain conditions or if you travel or work in places where you may be exposed to hepatitis A.  Hepatitis B vaccine. You may need this if you have certain conditions or if you travel or work in places where you may be exposed to hepatitis B.  Haemophilus influenzae type b (Hib) vaccine. You may need this if you have certain conditions.  Talk to your health care provider about which screenings and vaccines you need and how often you need them. This information is not intended to replace advice given to you by your health care provider. Make sure you discuss any questions you have with your health care provider. Document Released: 03/06/2015 Document Revised: 10/28/2015 Document Reviewed: 12/09/2014 Elsevier Interactive Patient Education  2017 [employer]. .

## 2016-08-31 NOTE — Progress Notes (Signed)
Patient presents to clinic today for annual exam.  Patient is fasting for labs.  Chronic Issues: GERD -- Patient endorses taking Prilosec as directed. Endorses good relief of symptoms with this regimen. Denies breakthrough symptoms as long as she takes her medication.  Fibromuscular Dysplasia and Left Spastic Hemiparesis -- Is followed by Neurology. Is currently on a regimen of Elavil and Flexeril. Is taking as directed. Has been doing well overall recently.   Hyperlipidemia -- Patient currently on lipitor 20 mg daily with daily ASA. Is taking as directed and trying to watch diet. Exercise is limited due to FMD and Hemiparesis.   Asthma -- Patient notes increased use of her albuterol inhaler recently due to seasonal allergies and the humidity. Is using albuterol a few times per day. Is noting occasional night-time symptoms. Is about to move to [place] where she grew up and is hopeful that this will help.   Anxiety -- Currently on Elavil 25 mg nightly, Effexor Xr 150 mg daily and PRN Ativan. Is doing very well on this regimen without breakthrough symptoms. Denies SI/HI.  Health Maintenance: Immunizations -- Declines testing today. Mammogram -- Last in EMR on 01/27/2015. Normal. Is ready for repeat mammogram. Denies symptoms.   Past Medical History:  Diagnosis Date  . Anxiety   . Asthma   . Chest pain    a. 10/2014 CTA Chest: no dissection.  . Depression   . Dysarthria    Left arm  . Fibromuscular dysplasia (HCC)   . High cholesterol   . History of stroke    a. 04/2014 MRI Brain: mod large chronic right MCA territory, chronic right cervical ICA dissection, smooth irregularity of the distal L cervical ICA consistent w/ underlying FMD, occlusion of multiple R MCA branch vessels in area of chronic infarct.   . Left hemiparesis (HCC)   . Migraine     Past Surgical History:  Procedure Laterality Date  . APPENDECTOMY    . BREAST ENHANCEMENT SURGERY    . VAGINAL HYSTERECTOMY    .  VENA CAVA FILTER PLACEMENT     and removal    Current Outpatient Prescriptions on File Prior to Visit  Medication Sig Dispense Refill  . albuterol (PROAIR HFA) 108 (90 Base) MCG/ACT inhaler Inhale 2 puffs into the lungs every 6 (six) hours as needed for wheezing or shortness of breath. 1 Inhaler 12  . amitriptyline (ELAVIL) 25 MG tablet TAKE 1 TABLET(25 MG) BY MOUTH AT BEDTIME 30 tablet 5  . Ascorbic Acid (VITAMIN C) 1000 MG tablet Take 1,000 mg by mouth daily. Reported on 07/27/2015    . aspirin 325 MG tablet Take 325 mg by mouth daily.    Marland Kitchen atorvastatin (LIPITOR) 20 MG tablet Take 1 tablet (20 mg total) by mouth daily. 90 tablet 1  . cetirizine (ZYRTEC) 10 MG tablet TAKE ONE TABLET BY MOUTH ONCE DAILY 90 tablet 1  . cyclobenzaprine (FLEXERIL) 5 MG tablet Take 3 tablets (15 mg total) by mouth every 8 (eight) hours as needed for muscle spasms. 270 tablet 5  . diclofenac (CATAFLAM) 50 MG tablet Take 1-2 tablets @ earliest onset of headache (not to exceed 2 tablets in 24hrs) 60 tablet 0  . LORazepam (ATIVAN) 0.5 MG tablet Take 1 tablet (0.5 mg total) by mouth 2 (two) times daily as needed for anxiety. 60 tablet 0  . omeprazole (PRILOSEC) 20 MG capsule TAKE ONE CAPSULE BY MOUTH ONCE DAILY BEFORE  MEAL 30 capsule 6  . promethazine (PHENERGAN) 25 MG  tablet Take 1 tablet (25 mg total) by mouth every 6 (six) hours as needed for nausea or vomiting. 30 tablet 0   No current facility-administered medications on file prior to visit.     Allergies  Allergen Reactions  . Duloxetine Hcl Rash    hallucinations  . Acetaminophen Hives  . Fish Allergy     Hives and migraines  . Hydromorphone Other (See Comments)    Could not speak or keep her eyes open  . Ondansetron Nausea And Vomiting  . Tylenol [Acetaminophen] Hives    Family History  Problem Relation Age of Onset  . Cancer Mother   . Aortic aneurysm Father     Social History   Social History  . Marital status: Married    Spouse name: Tammy SoursGreg    . Number of children: 3  . Years of education: 10 th   Occupational History  .      Disabled   Social History Main Topics  . Smoking status: Former Smoker    Quit date: 02/22/2011  . Smokeless tobacco: Never Used     Comment: Quit 2013  . Alcohol use No  . Drug use: Yes    Types: Marijuana  . Sexual activity: Not on file   Other Topics Concern  . Not on file   Social History Narrative   Patient lives at home with her husband Earl LitesGregory.   Disabled.   Education 10 th   Right handed.   Caffeine six cup of coffee daily.     Review of Systems  Constitutional: Negative for fever and weight loss.  HENT: Negative for ear discharge, ear pain, hearing loss and tinnitus.   Eyes: Negative for blurred vision, double vision, photophobia and pain.  Respiratory: Positive for shortness of breath. Negative for cough.   Cardiovascular: Negative for chest pain and palpitations.  Gastrointestinal: Negative for abdominal pain, blood in stool, constipation, diarrhea, heartburn, melena, nausea and vomiting.  Genitourinary: Negative for dysuria, flank pain, frequency, hematuria and urgency.  Musculoskeletal: Negative for falls.  Neurological: Negative for dizziness, loss of consciousness and headaches.       Chronic L-sided hemiparesis  Endo/Heme/Allergies: Negative for environmental allergies.  Psychiatric/Behavioral: Negative for depression, hallucinations, substance abuse and suicidal ideas. The patient is not nervous/anxious and does not have insomnia.    BP 100/70   Pulse (!) 108   Temp 98.3 F (36.8 C) (Oral)   Resp 14   Ht 5' (1.524 m)   Wt 176 lb (79.8 kg)   SpO2 97%   BMI 34.37 kg/m   Physical Exam  Constitutional: She is oriented to person, place, and time and well-developed, well-nourished, and in no distress.  HENT:  Head: Normocephalic and atraumatic.  Right Ear: External ear normal.  Left Ear: External ear normal.  Nose: Nose normal.  Mouth/Throat: Oropharynx is clear  and moist. No oropharyngeal exudate.  TM within normal limits bilaterally  Eyes: Conjunctivae are normal.  Neck: Neck supple.  Cardiovascular: Normal rate, regular rhythm, normal heart sounds and intact distal pulses.   Pulmonary/Chest: Effort normal and breath sounds normal. No respiratory distress. She has no wheezes. She has no rales. She exhibits no tenderness.  Neurological: She is alert and oriented to person, place, and time.  Skin: Skin is warm and dry. No rash noted.  Psychiatric: Affect normal.  Vitals reviewed.  Assessment/Plan: Left spastic hemiparesis Followed by neurology. Stable overall. Continue current regimen. Follow-up with specialist as scheduled.  High cholesterol Taking statin and  ASA as directed. Will repeat labs today. Dietary and exercise recommendations reviewed.  Fibromuscular dysplasia Followed by Neurology. Stable. Continue current regimen. Follow-up with specialist as scheduled.  Depression Doing very well at present. Continue current regimen.   Asthma, mild persistent Increased in frequency and severity. Will add on Asmanex to current regimen. Follow-up discussed.  Breast cancer screening Order for screening mammogram placed.   Visit for preventive health examination Depression screen negative. Health Maintenance reviewed. Preventive schedule discussed and handout given in AVS. Will obtain fasting labs today.     Piedad Climes, PA-C

## 2016-08-31 NOTE — Progress Notes (Signed)
Pre visit review using our clinic review tool, if applicable. No additional management support is needed unless otherwise documented below in the visit note. 

## 2016-09-01 ENCOUNTER — Ambulatory Visit (HOSPITAL_BASED_OUTPATIENT_CLINIC_OR_DEPARTMENT_OTHER): Payer: Self-pay

## 2016-09-01 DIAGNOSIS — Z1239 Encounter for other screening for malignant neoplasm of breast: Secondary | ICD-10-CM | POA: Insufficient documentation

## 2016-09-01 DIAGNOSIS — G7102 Facioscapulohumeral muscular dystrophy: Secondary | ICD-10-CM | POA: Insufficient documentation

## 2016-09-01 DIAGNOSIS — Z0000 Encounter for general adult medical examination without abnormal findings: Secondary | ICD-10-CM | POA: Insufficient documentation

## 2016-09-01 LAB — LIPID PANEL
Cholesterol: 161 mg/dL (ref 0–200)
HDL: 55.5 mg/dL (ref >39.00)
LDL Cholesterol: 70 mg/dL (ref 0–99)
NonHDL: 105.59
Total CHOL/HDL Ratio: 3
Triglycerides: 178 mg/dL — ABNORMAL HIGH (ref 0.0–149.0)
VLDL: 35.6 mg/dL (ref 0.0–40.0)

## 2016-09-01 NOTE — Assessment & Plan Note (Signed)
Doing very well at present. Continue current regimen.

## 2016-09-01 NOTE — Assessment & Plan Note (Signed)
Order for screening mammogram placed.  

## 2016-09-01 NOTE — Assessment & Plan Note (Signed)
Taking statin and ASA as directed. Will repeat labs today. Dietary and exercise recommendations reviewed.

## 2016-09-01 NOTE — Assessment & Plan Note (Signed)
Followed by Neurology. Stable. Continue current regimen. Follow-up with specialist as scheduled.

## 2016-09-01 NOTE — Assessment & Plan Note (Signed)
Followed by neurology. Stable overall. Continue current regimen. Follow-up with specialist as scheduled.

## 2016-09-01 NOTE — Assessment & Plan Note (Signed)
Depression screen negative. Health Maintenance reviewed. Preventive schedule discussed and handout given in AVS. Will obtain fasting labs today.  

## 2016-09-01 NOTE — Assessment & Plan Note (Signed)
Increased in frequency and severity. Will add on Asmanex to current regimen. Follow-up discussed.

## 2016-09-02 ENCOUNTER — Encounter: Payer: Self-pay | Admitting: Neurology

## 2016-09-02 ENCOUNTER — Other Ambulatory Visit: Payer: Self-pay | Admitting: *Deleted

## 2016-09-19 ENCOUNTER — Other Ambulatory Visit: Payer: Self-pay | Admitting: Family Medicine

## 2016-09-20 NOTE — Telephone Encounter (Signed)
Last OV 08/31/16 Lorazepam last filled 07/25/16 #60 with 0  CSC, UDS due low risk

## 2016-09-20 NOTE — Telephone Encounter (Signed)
Medication filled to pharmacy as requested.   

## 2016-10-07 ENCOUNTER — Ambulatory Visit: Payer: Self-pay | Admitting: Neurology

## 2016-10-16 ENCOUNTER — Other Ambulatory Visit: Payer: Self-pay | Admitting: Neurology

## 2016-11-07 ENCOUNTER — Telehealth: Payer: Self-pay | Admitting: Physician Assistant

## 2016-11-07 DIAGNOSIS — G8114 Spastic hemiplegia affecting left nondominant side: Secondary | ICD-10-CM

## 2016-11-07 DIAGNOSIS — I773 Arterial fibromuscular dysplasia: Secondary | ICD-10-CM

## 2016-11-07 DIAGNOSIS — R471 Dysarthria and anarthria: Secondary | ICD-10-CM

## 2016-11-07 NOTE — Telephone Encounter (Signed)
Patient requesting referral from pcp to see specialist in [place], Dr. Ronny Flurry. Fax # [phone] NPI # [id number]

## 2016-11-08 NOTE — Telephone Encounter (Signed)
Referral placed.

## 2016-11-17 ENCOUNTER — Other Ambulatory Visit: Payer: Self-pay | Admitting: Emergency Medicine

## 2016-11-17 MED ORDER — ATORVASTATIN CALCIUM 20 MG PO TABS: 20.0000 mg | ORAL_TABLET | Freq: Every day | ORAL | 0 refills | Status: AC

## 2016-12-01 IMAGING — DX DG KNEE 3 VIEWS*L*
3 series · 3 of 3 positions shown · non-contrast
Comparison: None.

CLINICAL DATA: Left knee and ankle pain for 1 week without injury.

EXAM:
LEFT KNEE - 3 VIEW

[knee ap]
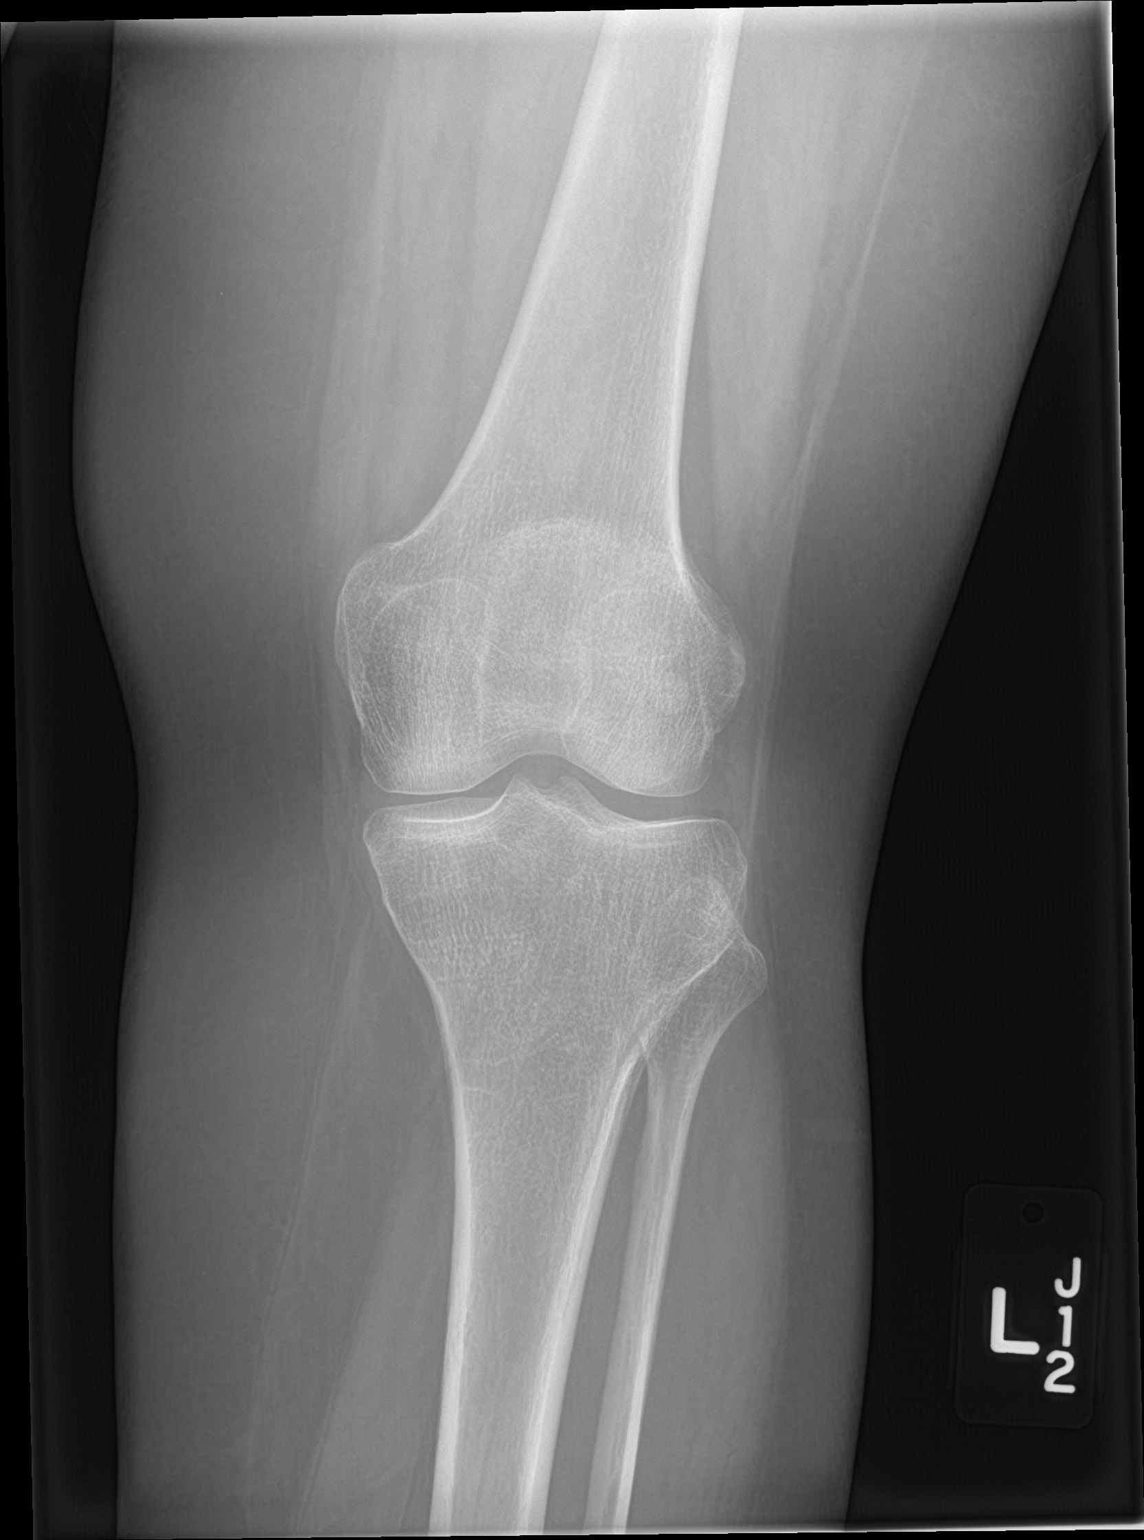

[knee lat]
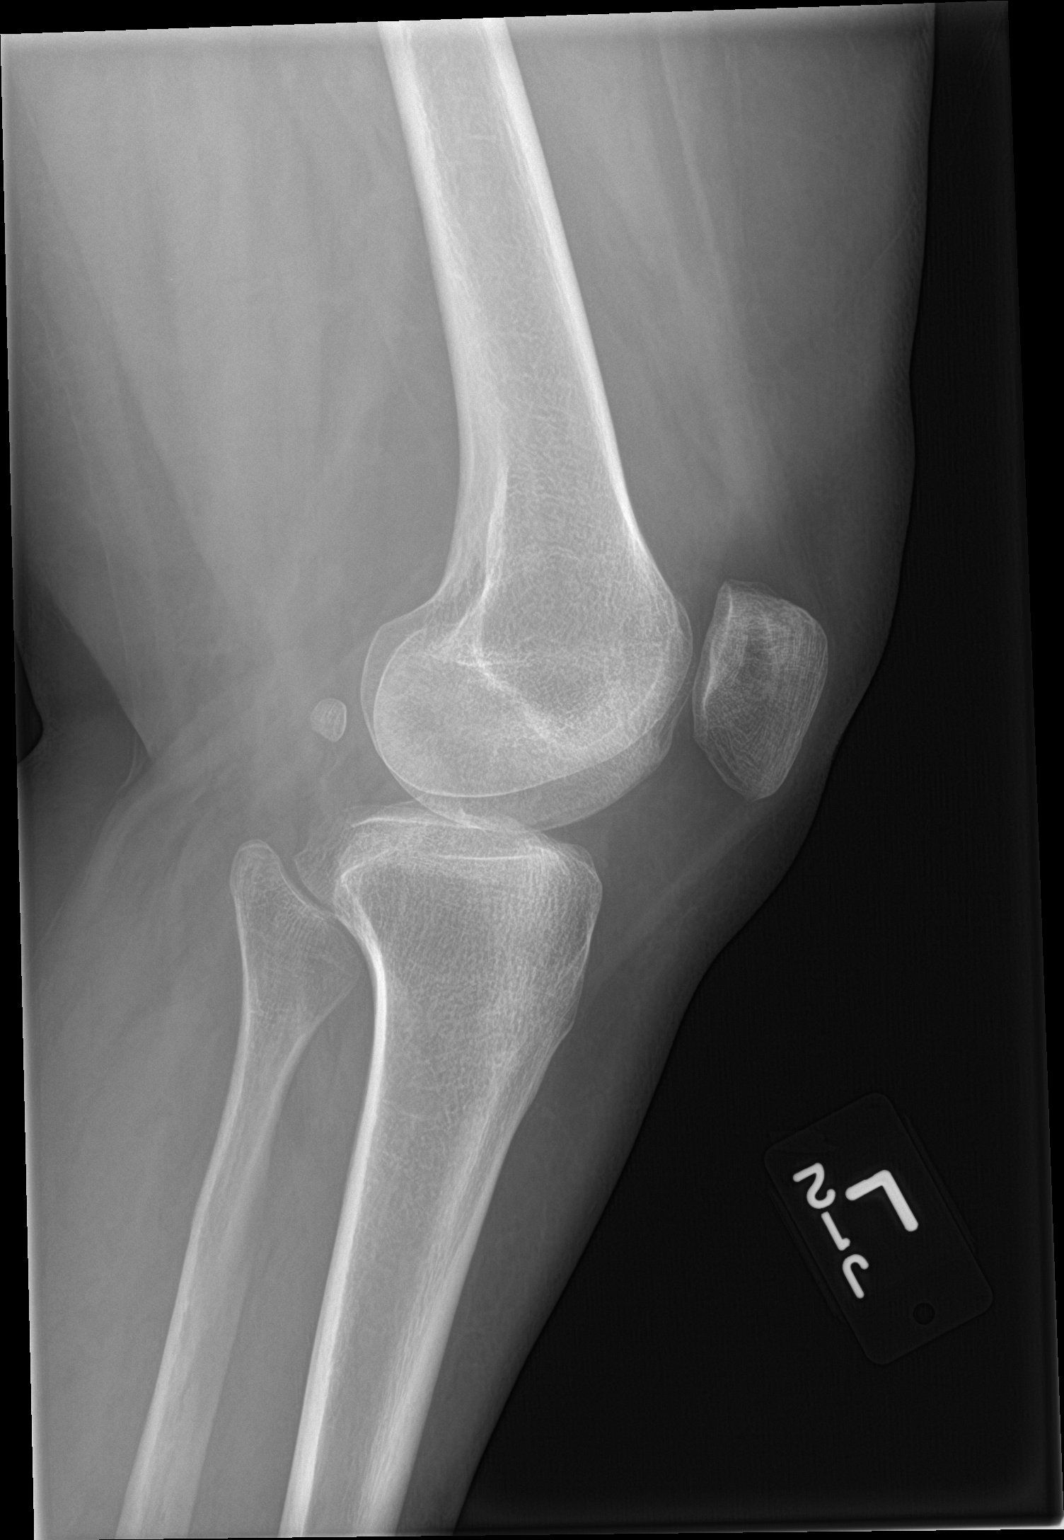

[knee sunrise]
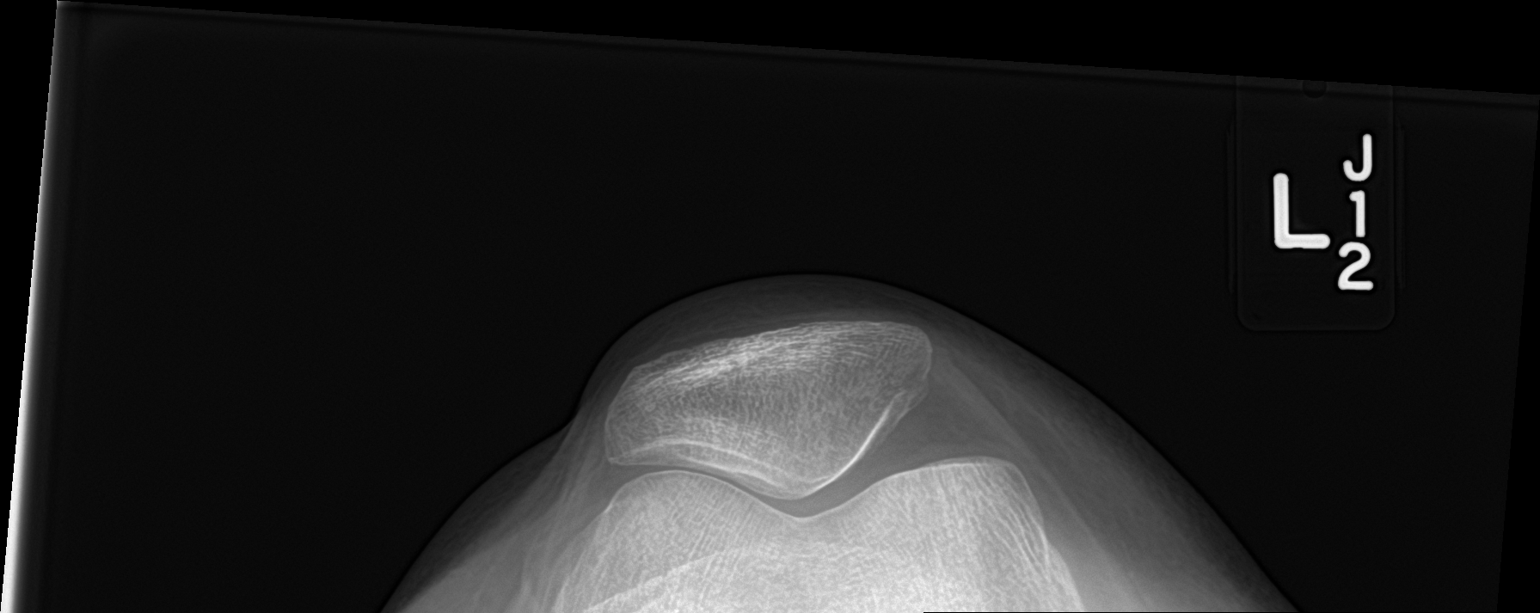

[3 of 3 positions shown; findings below may reference images not displayed]

FINDINGS: There is no evidence of fracture, dislocation, or joint effusion.
There is no evidence of arthropathy or other focal bone abnormality.
Soft tissues are unremarkable.
IMPRESSION: Negative.

## 2016-12-01 IMAGING — DX DG ANKLE COMPLETE 3+V*L*
3 series · 3 of 3 positions shown · non-contrast
Comparison: No recent prior.

CLINICAL DATA: Left knee and ankle pain.  No known injury.

EXAM:
LEFT ANKLE COMPLETE - 3+ VIEW

[ankle ap]
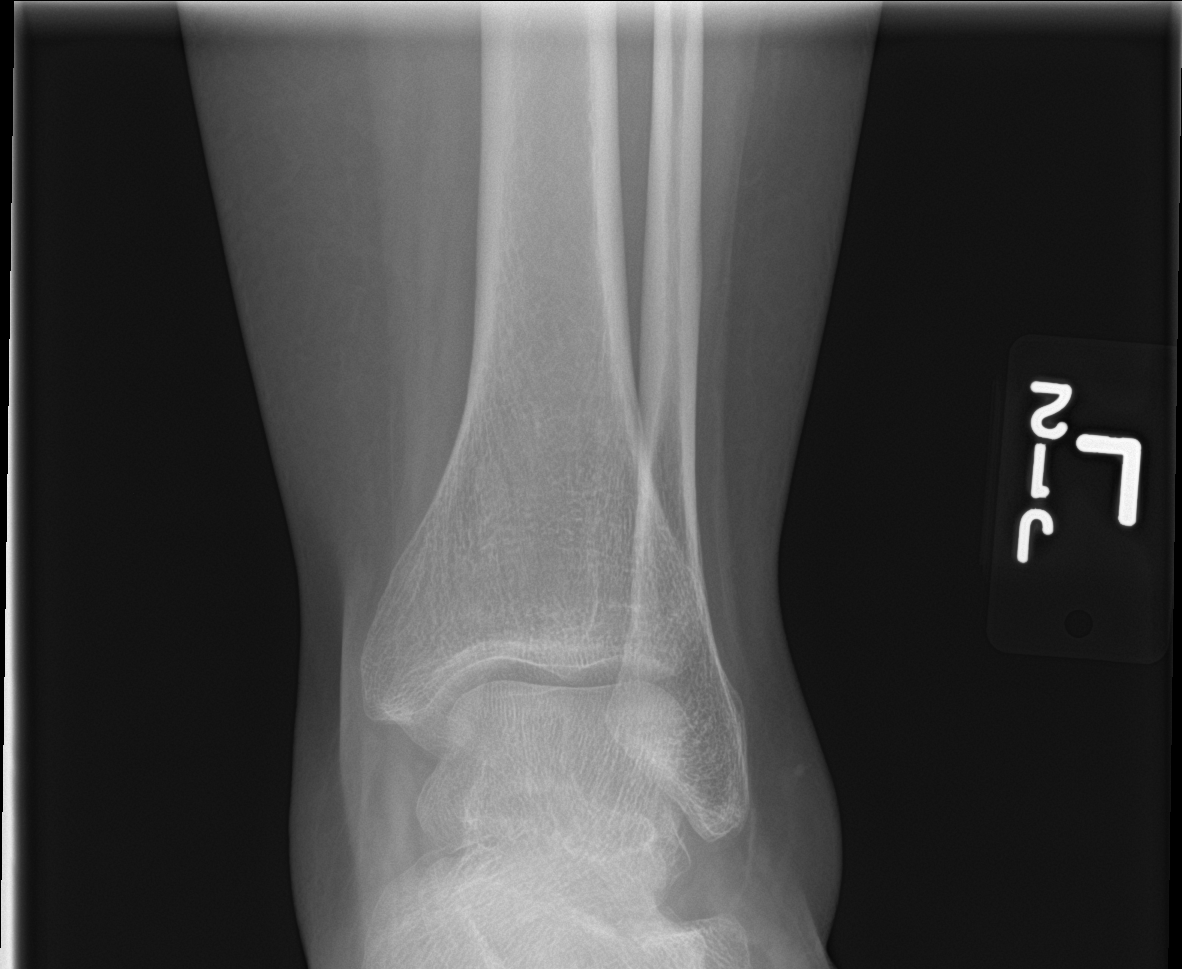

[ankle obl]
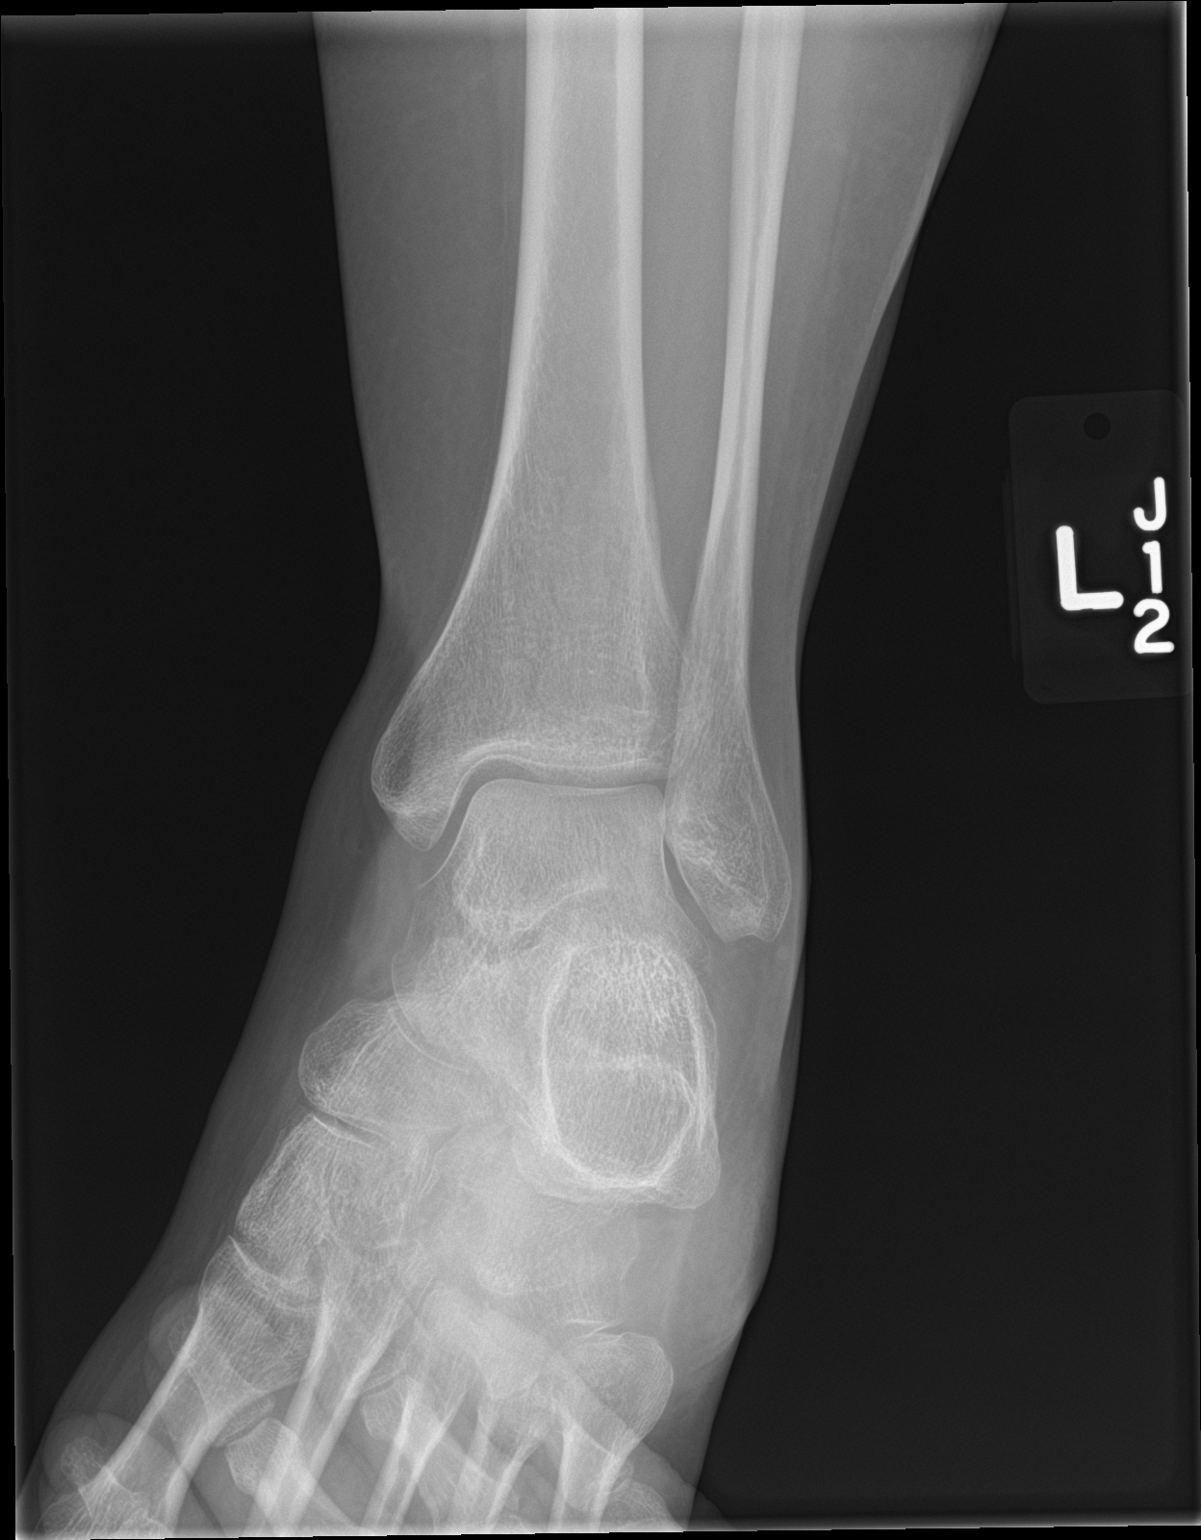

[ankle lat]
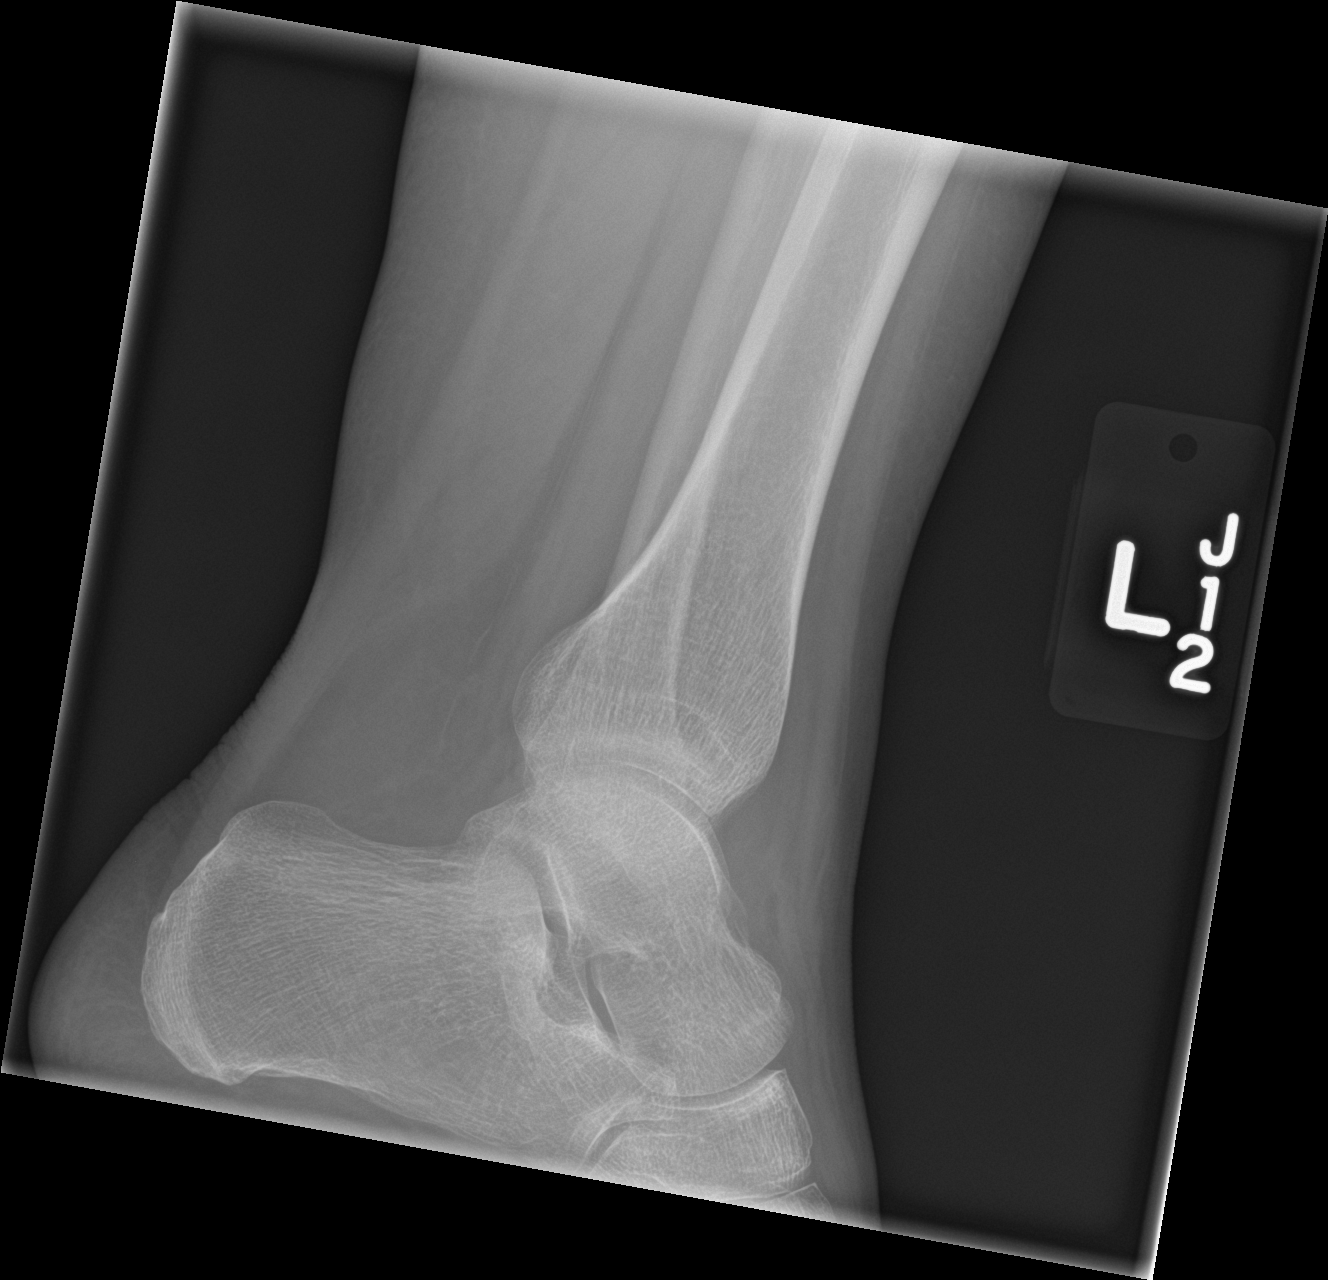

[3 of 3 positions shown; findings below may reference images not displayed]

FINDINGS: Diffuse soft tissue swelling. No acute bony or joint abnormality
identified. No evidence of fracture or dislocation.
IMPRESSION: Diffuse soft tissue swelling.  No acute bony abnormality.

## 2017-04-18 ENCOUNTER — Other Ambulatory Visit: Payer: Self-pay | Admitting: Physician Assistant

## 2017-11-01 ENCOUNTER — Inpatient Hospital Stay: Admit: 2017-11-01 | Discharge: 2017-11-01 | Disposition: A | Discharge status: Home / Self Care

## 2017-11-01 DIAGNOSIS — S93602A Unspecified sprain of left foot, initial encounter: Secondary | ICD-10-CM

## 2017-11-01 MED ORDER — IBUPROFEN 800 MG PO TABS
800 MG | ORAL_TABLET | Freq: Three times a day (TID) | ORAL | 0 refills | Status: DC | PRN
Start: 2017-11-01 — End: 2019-03-22

## 2017-11-01 NOTE — ED Triage Notes (Signed)
Patient with previous history of stroke with related left sided deficits.  Ambulates with use of cane.  Patient states she was rising from toilet this morning and fell.  Denies striking head or any LOC.  Complains of left foot pain.

## 2017-11-01 NOTE — Other (Unsigned)
Patient Acct Nbr: [id number]   Primary AUTH/CERT:   Primary Insurance Company Name: Company secretary Plan name: [facility] Medicare  Primary Insurance Group Number: [id number]  Primary Insurance Plan Type: Health  Primary Insurance Policy Number: [id number]

## 2017-11-01 NOTE — ED Provider Notes (Signed)
[facility] ED  eMERGENCY dEPARTMENT eNCOUnter      Pt Name: Connie Lyons  MRN: [id number]  Birthdate 01/03/1971  Date of evaluation: 11/01/2017  Provider: Keelen Quevedo Reymundo Poll, PA     CHIEF COMPLAINT       Chief Complaint   Patient presents with   . Foot Pain         HISTORY OF PRESENT ILLNESS   (Location/Symptom, Timing/Onset,Context/Setting, Quality, Duration, Modifying Factors, Severity) Note limiting factors.   HPI    Connie Lyons is a 47 y.o. female who presents to the emergency department complaining of left foot injury.  Patient has left side residual weakness and range of motion secondary to previous stroke.  She got up off the toilet this morning and she fell and she is not sure what direction her foot when and where it hit something.  She is complaining of pain over the dorsum of the foot.  She states when she tries to put weight on it the pain shoots into her ankle.  She denies striking her head.  She states that she has otherwise, recently, been well.     Nursing Notes were reviewed.    REVIEW OF SYSTEMS    (2+ forlevel 4; 10+ for level 5)      Review of Systems   Constitutional: Negative for chills, diaphoresis and fever.   HENT: Negative for congestion, ear pain, facial swelling, rhinorrhea and sore throat.    Eyes: Negative for photophobia, pain and visual disturbance.   Respiratory: Negative for cough, chest tightness, shortness of breath and wheezing.    Cardiovascular: Negative for chest pain and leg swelling.   Gastrointestinal: Negative for abdominal pain, constipation, diarrhea, nausea and vomiting.   Genitourinary: Negative for difficulty urinating, dysuria, flank pain, frequency, hematuria and urgency.   Musculoskeletal: Negative for arthralgias, back pain, myalgias and neck pain.        Left foot injury.   Skin: Negative for rash.   Neurological: Negative for dizziness and headaches.   Psychiatric/Behavioral: Negative for suicidal ideas.       PAST MEDICAL HISTORY     Past Medical History:    Diagnosis Date   . Cerebral artery occlusion with cerebral infarction (HCC)    . Hyperlipidemia        SURGICALHISTORY       Past Surgical History:   Procedure Laterality Date   . APPENDECTOMY     . BREAST SURGERY     . HYSTERECTOMY         CURRENT MEDICATIONS       Discharge Medication List as of 11/01/2017  1:36 PM          ALLERGIES     Cymbalta [duloxetine hcl]; Dilaudid [hydromorphone hcl]; Fish-derived products; Tylenol [acetaminophen]; and Zofran [ondansetron hcl]    FAMILY HISTORY     History reviewed. No pertinent family history.       SOCIAL HISTORY       Social History     Socioeconomic History   . Marital status: Married     Spouse name: None   . Number of children: None   . Years of education: None   . Highest education level: None   Occupational History   . None   Social Needs   . Financial resource strain: None   . Food insecurity:     Worry: None     Inability: None   . Transportation needs:     Medical: None  Non-medical: None   Tobacco Use   . Smoking status: Former Games developer   . Smokeless tobacco: Never Used   Substance and Sexual Activity   . Alcohol use: Never     Frequency: Never   . Drug use: Yes     Types: Marijuana   . Sexual activity: None   Lifestyle   . Physical activity:     Days per week: None     Minutes per session: None   . Stress: None   Relationships   . Social connections:     Talks on phone: None     Gets together: None     Attends religious service: None     Active member of club or organization: None     Attends meetings of clubs or organizations: None     Relationship status: None   . Intimate partner violence:     Fear of current or ex partner: None     Emotionally abused: None     Physically abused: None     Forced sexual activity: None   Other Topics Concern   . None   Social History Narrative   . None       SCREENINGS      @FLOW ([id number])@    PHYSICAL EXAM    (5+ for level 4, 8+ for level 5)     ED Triage Vitals [11/01/17 1214]   BP Temp Temp Source Pulse Resp SpO2 Height  Weight   (!) 127/100 98 F (36.7 C) Temporal 117 16 95 % -- 175 lb (79.4 kg)       Physical Exam   Constitutional: She is oriented to person, place, and time. She appears well-developed and well-nourished. No distress.   HENT:   Head: Normocephalic.   Eyes: Conjunctivae are normal.   Neck: Normal range of motion. Neck supple.   Cardiovascular: Normal rate and regular rhythm. Exam reveals no gallop and no friction rub.   No murmur heard.  Pulmonary/Chest: Effort normal and breath sounds normal. She has no wheezes. She has no rales.   Abdominal: Soft. Bowel sounds are normal. She exhibits no distension. There is no tenderness.   Musculoskeletal:   Examination of the left foot does reveal soft tissue edema over the dorsum of the left foot with a small ecchymotic area proximal to the second toe.  She has good range of motion of the digits without difficulty.  Distal sensory innervations intact.  Capillary refill is brisk.  Dorsalis pedis pulse is easily appreciated.  There is no palpable bony deformity or crepitus.  Pain on palpation and the ankle.   Neurological: She is alert and oriented to person, place, and time.   Skin: Skin is warm and dry. No rash noted. She is not diaphoretic.   Psychiatric: She has a normal mood and affect.   Nursing note and vitals reviewed.      DIAGNOSTIC RESULTS     EKG (Per Emergency Physician):       RADIOLOGY (Per EmergencyPhysician):       Interpretation per the Radiologist below, if available at the time of this note:  Xr Foot Left (min 3 Views)    Result Date: 11/01/2017  Patient Name:  Connie, Lyons MRN:  [id number] FIN:  [id number] ---Diagnostic Radiology--- Exam Date/Time        11/01/2017 12:59:28 EDT  Exam                  CR Foot Complete 3+ Views Left                      Ordering Physician    Kandis Cocking, Adalee Kathan L                                  Accession Number      [id number]                                       CPT4 Codes 95621 () Reason For  Exam pain Fall Report LEFT FOOT CLINICAL INDICATION: Pain after trauma AP, lateral, and oblique plain film views of the left foot were obtained. COMPARISON: None. No fracture or dislocation of the left foot is identified. There is soft tissue swelling along the dorsal aspect of the left foot over the metatarsals. No soft tissue gas or radiopaque foreign body is seen. IMPRESSION: No fracture or dislocation of the left foot is identified. Nonspecific dorsal soft tissue swelling. Report Dictated on Workstation: [id number] ---** Final ---** Dictating Physician: Rory Percy, MD, Letta Moynahan Signed Date and Time: 11/01/2017 1:17 pm Signed by: Rory Percy, MD, Letta Moynahan Transcribed Date and Time: 11/01/2017 1:18      LABS:  Labs Reviewed - No data to display    All other labs were within normal range or not returned as of this dictation.    EMERGENCY DEPARTMENT COURSE and DIFFERENTIALDIAGNOSIS/MDM:   Vitals:    Vitals:    11/01/17 1214 11/01/17 1306   BP: (!) 127/100 110/81   Pulse: 117 104   Resp: 16 16   Temp: 98 F (36.7 C) 98 F (36.7 C)   TempSrc: Temporal Temporal   SpO2: 95% 100%   Weight: 79.4 kg (175 lb)        Medications - No data to display    MDM.  The patient was sent for an x-ray of the left foot.  Per radiologist review, no acute findings were made.  I did discuss the patient what she felt weakened due to support the foot that would be the safest option for her.  They did finally choose an Ace wrap.  She was discharged home.  She is given home going instructions on foot sprains.  She is to ice and elevate area often.  She will be given a prescription for Motrin.  She is to follow-up with her private physician in 5-7 days.  She can return should signs and symptoms worsen in anyway or any other concerns develop.  The patient expressed an understanding of verbal instructions and had no further questions at the time of discharge.    This patient was seen by myself, within my scope of practice, with the Emergency  Department physician available for consultation at all times if needed.    CONSULTS:  None    PROCEDURES:  Unless otherwise noted below, none     Procedures    FINAL IMPRESSION      1. Foot sprain, left, initial encounter        DISPOSITION/PLAN   DISPOSITION Decision To Discharge 11/01/2017 01:35:23 PM      PATIENT REFERRED TO:  Lennette Bihari, MD  [address]  [place] [address]  [phone]    In 5 days        DISCHARGE MEDICATIONS:  Discharge Medication List as of 11/01/2017  1:36 PM      START taking these medications    Details   ibuprofen (ADVIL;MOTRIN) 800 MG tablet Take 1 tablet by mouth every 8 hours as needed for Pain or Fever, Disp-21 tablet, R-0Print                (Please note:  Portions of this note were completed with a voice recognition program.  Efforts were made to edit thedictations but occasionally words and phrases are mis-transcribed.)    Form v2016.J.[id number]    Braiden Rodman Reymundo Poll, PA (electronically signed)  Emergency Medicine Provider           Richrd Sox, PA  11/01/17 2202

## 2017-11-01 NOTE — ED Notes (Signed)
Pt states she twisted left foot while getting off toilet this morning. Left pedal foot noted to be swollen on top and scattered dark purple bruising in color. Pedal pulse faintly palpable.      Julian Reil, RN  11/01/17 [phone]

## 2017-11-01 NOTE — ED Notes (Signed)
3 inch ace wrap applied to left foot     Julian Reil, RN  11/01/17 1350

## 2017-11-01 NOTE — ED Notes (Signed)
Xray at pt bedside.      Julian Reilebecca Clarice Bonaventure, RN  11/01/17 1246

## 2018-11-28 NOTE — Telephone Encounter (Signed)
Pt has upcoming appt on 12/05/18 to establish care. Pt cannot receive Botox at this appt. After pt is established with office and we have all her updated info and insurance we can look into getting Botox approved but she cannot receive Botox at this appt.    Thanks!

## 2018-11-29 NOTE — Telephone Encounter (Signed)
Attempt made to reach pt to relay information below. The number on file is not a working number.

## 2018-12-05 ENCOUNTER — Encounter: Attending: Neurology | Primary: Internal Medicine

## 2019-03-22 ENCOUNTER — Inpatient Hospital Stay: Admit: 2019-03-22 | Discharge: 2019-03-22 | Disposition: A | Discharge status: Home / Self Care

## 2019-03-22 DIAGNOSIS — S8262XA Displaced fracture of lateral malleolus of left fibula, initial encounter for closed fracture: Secondary | ICD-10-CM

## 2019-03-22 MED ORDER — IBUPROFEN 400 MG PO TABS
400 MG | Freq: Once | ORAL | Status: AC
Start: 2019-03-22 — End: 2019-03-22
  Administered 2019-03-22: 22:00:00 400 mg via ORAL

## 2019-03-22 MED ORDER — OXYCODONE HCL 5 MG PO TABS
5 MG | ORAL_TABLET | Freq: Three times a day (TID) | ORAL | 0 refills | Status: AC | PRN
Start: 2019-03-22 — End: 2019-03-27

## 2019-03-22 MED ORDER — IBUPROFEN 600 MG PO TABS
600 MG | ORAL_TABLET | Freq: Three times a day (TID) | ORAL | 0 refills | Status: DC | PRN
Start: 2019-03-22 — End: 2019-10-07

## 2019-03-22 MED FILL — IBUPROFEN 400 MG PO TABS: 400 mg | ORAL | Qty: 1

## 2019-03-22 NOTE — Discharge Instructions (Addendum)
You were seen for left ankle pain.  You have a fracture to her left ankle.  Please follow-up with the doctor provided.    What to do next: Please follow-up with Dr. Azell Der.  His numbers listed in the paperwork.  Please make an appointment for next week.  Please wear the orthotic boot at all times except when showering.  Weightbearing limited on that side.  Take the pain medication as needed.      Please return to the ER if your symptoms worsen including unable to move her foot, foot turns black.

## 2019-03-22 NOTE — ED Triage Notes (Signed)
Pt states she fell and twisted her ankle PTA. Pt denies hittin head. Pt states she uses a cane- pt states previous stroke, left side deficit. Pt placed in WC on arrival. Pt states she can walk on left foot- c/o pain.

## 2019-03-22 NOTE — ED Notes (Signed)
DR Beryl Meager Atif Chapple  03/22/19 1809

## 2019-03-22 NOTE — ED Notes (Signed)
PAGED DR Beryl Meager Ismael Treptow  03/22/19 1746

## 2019-03-22 NOTE — ED Provider Notes (Signed)
Emergency DepartmentEncounter  Con[facility] Lyons ED    Patient: Connie Lyons  ZOX:[id number]  DOB: 06/29/1970  Date of Evaluation: 03/22/2019  ED APP Provider: Claris Gladden, PA      Chief Complaint       Chief Complaint   Patient presents with   ??? Ankle Pain     Left     HOPI     I was wearing a n95 mask for the entirety of this encounter.  Connie Lyons is a 49 y.o. female whopresents to the emergency department for evaluation of left ankle pain after having mechanical fall earlier today.  Patient states she tripped while getting the dog bowl, falling down on the left lateral side of her left ankle and everting the foot.  Patient states there is pain specifically at the lateral malleolus.  Small amount of swelling present.  No numbness or tingling.  Patient is able to move her feet.  Patient was able to walk after the injury.  Patient denies any blood thinners.  Denies alcohol or drug use.  Patient does have ambulation problems, difficulty moving her left upper extremity.  Patient does take daily aspirin.    Patient denies shortness of breath, chest pain, nausea, vomiting, constipation, diarrhea, fever, aches, chills, frequency, urgency, burning with urination, one sided weakness or numbness.  Patient's past medical history includes previous stroke, hyperlipidemia.  ROS:     Review of Systems  At least 6 systems reviewed and otherwise acutely negative except as in the [place].    Past History     Past Medical History:   Diagnosis Date   ??? Cerebral artery occlusion with cerebral infarction [facility])    ??? Hyperlipidemia      Past Surgical History:   Procedure Laterality Date   ??? APPENDECTOMY     ??? BREAST SURGERY     ??? HYSTERECTOMY       Social History     Socioeconomic History   ??? Marital status: Married     Spouse name: None   ??? Number of children: None   ??? Years of education: None   ??? Highest education level: None   Occupational History   ??? None   Social Needs   ??? Financial resource strain: None   ??? Food insecurity     Worry:  None     Inability: None   ??? Transportation needs     Medical: None     Non-medical: None   Tobacco Use   ??? Smoking status: Former Smoker   ??? Smokeless tobacco: Never Used   Substance and Sexual Activity   ??? Alcohol use: Never     Frequency: Never   ??? Drug use: Yes     Types: Marijuana   ??? Sexual activity: None   Lifestyle   ??? Physical activity     Days per week: None     Minutes per session: None   ??? Stress: None   Relationships   ??? Social Product manager on phone: None     Gets together: None     Attends religious service: None     Active member of club or organization: None     Attends meetings of clubs or organizations: None     Relationship status: None   ??? Intimate partner violence     Fear of current or ex partner: None     Emotionally abused: None     Physically abused: None  Forced sexual activity: None   Other Topics Concern   ??? None   Social History Narrative   ??? None       Medications/Allergies     Previous Medications    AMITRIPTYLINE (ELAVIL) 10 MG TABLET    Take 20 mg by mouth nightly    ASPIRIN 325 MG TABLET    Take 325 mg by mouth daily     Allergies   Allergen Reactions   ??? Cymbalta [Duloxetine Hcl]    ??? Dilaudid [Hydromorphone Hcl]    ??? Fish-Derived Products    ??? Tylenol [Acetaminophen] Hives   ??? Zofran [Ondansetron Hcl] Nausea And Vomiting        Physical Exam       ED Triage Vitals   BP Temp Temp Source Pulse Resp SpO2 Height Weight   03/22/19 1632 03/22/19 1632 03/22/19 1632 03/22/19 1632 03/22/19 1632 03/22/19 1632 03/22/19 1630 03/22/19 1630   113/65 97.8 ??F (36.6 ??C) Temporal 105 16 97 % 5\' 1"  (1.549 m) 173 lb (78.5 kg)     Physical Exam  GENERAL APPEARANCE: Awake and alert. Cooperative. No acute distress.  HEAD: Normocephalic. Atraumatic.  EYES: EOM's grossly intact. Sclera anicteric.  ENT: Mucous membranes are moist. Tolerates saliva. No trismus.  NECK: Supple. Trachea midline.  HEART: RRR. No gallops or murmurs noted.   LUNGS: Respirations unlabored.   EXTREMITIES:   Focused Left  ankle exam: Proximal fibular tenderness Absent . Tissue compartments are soft. Pulses 2+. Wiggles toes. Light touch intact intact. Tender to palpation over the lateral malleolus. Swelling is  present. Echymosis is not present. Fifth metatarsal tenderness is not present.  SKIN: Warm and dry.  NEUROLOGICAL: No gross facial drooping. Moves all 4 extremities spontaneously.  PSYCHIATRIC: Normal mood.    SCREENINGS    Glasgow Coma Scale  Eye Opening: Spontaneous  Best Verbal Response: Oriented      D   Labs:  No results found for this visit on 03/22/19.  Radiographs:  Xr Ankle Left (2 Views)    Result Date: 03/22/2019  Patient Name:                Connie, Lyons MRN:                         Marion Downer Acct#:                       [id number] Diagnostic Radiology ACCESSION                 EXAM DATE/TIME           PROCEDURE                 ORDERING PROVIDER [phone]             03/22/2019 18:00 EST      CR Ankle 2 Views Left     03/24/2019 -938101 CPT code Nancy Marus Reason For Exam (CR Ankle 2 Views Left) stress view Report Examination: Left ankle gravity stress Clinical Indication: stress view, fracture Comparison: 03/22/2019 Findings: Gravity stress view demonstrates minimal 2 mm lateral displacement of the lateral malleolar fracture fragment. Medial ankle mortise measures 4 mm. Impression: Oblique distal diaphysis fibular fracture with minimal lateral displacement. 4 mm prominence of the medial ankle mortise. Report Dictated on Workstation: [id number] ---** Final ---** Dictating Physician: 03/24/2019, MD, Val Riles Signed Date and Time: 03/22/2019 6:21 pm Signed by: 03/24/2019, MD, Val Riles Transcribed  Date and Time: 03/22/2019 6:22    Xr Ankle Left (min 3 Views)    Result Date: 03/22/2019  Patient Name:                Connie, Lyons MRN:                         [id number] Acct#:                       [id number] Diagnostic Radiology ACCESSION                 EXAM DATE/TIME           PROCEDURE                  ORDERING PROVIDER [phone]             03/22/2019 17:06 EST      CR Ankle 3+ Views Left    [id number] Nancy Marus CPT code 21194 Reason For Exam (CR Ankle 3+ Views Left) Left ankle pain after fall, pain at lateral mallelous Report LEFT ANKLE 3 VIEWS CLINICAL INDICATION: Left ankle pain after fall, pain at lateral malleolus TECHNIQUE: 3 views of the left ankle. COMPARISON: None. FINDINGS: Oblique fracture in lateral malleolus, with minimal distraction. No dislocation. Ankle mortise maintained. Lateral soft tissue edema. IMPRESSION: 1. Fracture left lateral malleolus. Report Dictated on Workstation: [id number] ---** Final ---** Dictating Physician: Georgetta Haber, MD, WENDELL Signed Date and Time: 03/22/2019 5:12 pm Signed by: Georgetta Haber, MD, [facility] Transcribed Date and Time: 03/22/2019 5:13      :       EKG: All EKG's areinterpreted by the Emergency Department Physician in the absence of a cardiologist. see their note for interpretation of EKG.    ED Course and MDM    Glasgow Coma Scale  Eye Opening: Spontaneous  Best Verbal Response: Oriented      In brief, Mailen Newborn isa 49 y.o. female who presented to the emergency department for evaluation of left ankle pain after having a mechanical fall.  Please see history of present illness for further details.    X-ray of the left ankle was ordered.  X-ray shows a fracture of the left lateral malleolus.  Fracture is Weber B criteria.    I spoke with Kathlene November, the orthopedic resident who examined the films.  The fracture is not super significant so patient should be able to have slight weightbearing in the left lower extremity.  Patient can be sent home with an orthotic boot and follow-up with Dr. Azell Der.  Patient did not want any additional pain medication but would like pain medication to go home with.  Vital signs and nursing notes have been reviewed for this patient.   Patient was educated on their diagnosis/condition. Patient understood the severity of their situation. The patient  agreed with the plan and had no further questions at this time.         ED Medication Orders (From admission, onward)    Start Ordered     Status Ordering Provider    03/22/19 1651 03/22/19 1650  ibuprofen (ADVIL;MOTRIN) tablet 400 mg  ONCE      Last MAR action: Given - by HORN, EMILY on 03/22/19 at 1711 Harmoni Lucus, Earna Coder          Final Impression      1. Closed high lateral malleolus fracture, left, initial encounter        DISPOSITION Discharge -  Pending Orders Complete 03/22/2019 04:52:44 PM     (Please note that portions of this note may have been completed with a voice recognition program. Efforts were made to edit the dictations but occasionally words aremis-transcribed.)    Nancy Marus, PA  [employer]        [place], [place]  03/22/19 [phone]

## 2019-03-27 ENCOUNTER — Ambulatory Visit
Admit: 2019-03-27 | Discharge: 2019-04-01 | Payer: MEDICARE | Attending: Foot and Ankle Surgery | Primary: Internal Medicine

## 2019-03-27 DIAGNOSIS — S8262XA Displaced fracture of lateral malleolus of left fibula, initial encounter for closed fracture: Secondary | ICD-10-CM

## 2019-03-27 NOTE — Progress Notes (Signed)
[facility]  [facility] [place]   [address]  [address]  [place] [address]  Dept: [phone]  Loc: [phone]          Connie Lyons  1971/01/08  [id number]  03/27/2019    HPI  Connie Lyons is a 49 y.o. female who is here today for evaluation of her Left ankle    Connie Lyons has a chief complaint of afracture    Connie Lyons reports the current issuestarted approximately 5 day(s) ago.   She fell and injured the left ankle.  She was seen in the emergency department and placed in a boot.  She has been weightbearing on the left ankle since the injury.  She feels that she often has issues with her foot and ankle because her toes curl.  She has a neuromuscular disorder and because of the curled toes has difficulty bearing weight at times.  She asked if there are any treatments for the toes.  She has had Botox injections in the past that temporarily correct the problem.    Current symptoms: pain, swelling and discoloration    Connie Lyons has seen another medical professional for this problem: Yes    Connie Lyons has tried or been treated with thefollowing: NSAIDs or other OTC medications, a brace/splint/boot and walking aids (cane,crutches,walker,etc)    Review of Systems   Surgical Risk Factors:   Allergies to Metals or Latex: NO   Have you been treated for a blood clot:  Yes, lower left    Have you had a history of bleeding disorder:  Clotting disorder    Have you had a history of Anesthetic problems: NO   Do you have tendency to bruise easily:  Yes, ASA    Do you experience prolonged or excessive bleeding from cuts or after  surgery:NO  General/Constitutional:     General: NO   Cancer:  NO   Acute/Chronic Infections: NO  HEENT/Neck:   Problems with theThroat:  NO   Problems with the Eyes:  NO   Problems with the Ears:  NO   Problems with the Nose and Sinuses:  NO  Endocrine:   Problems with Diabetes: NO   Problems with Thyroid Disorder: NO  Thorax:     Problems with the  Heart: NO   Problems with the Lung:  NO  Cardiovascular:   Problems with Circulation:  NO   Problems with High Blood pressure:  NO  Gastrointestinal:   Problems with Ulcers:  NO   Problems with the Liver:  NO   Problems with Bowel Habits:  NO  Genitourinary:   Problems with the Genitals: NO   Urinary problems: NO   Kidney disease or stones:  NO  Skin:    Any general problems:  NO  Neurologic:   Dizziness, blurred vision, headaches, problems with balance :  NO   Seizures or Stroke: massive stroke 2012 affecting left side   Psychiatric:   Emotional or Psychological disorders:  NO   Depression or Anxiety:  NO     Allergies   Allergen Reactions   ??? Cymbalta [Duloxetine Hcl]    ??? Dilaudid [Hydromorphone Hcl]    ??? Fish-Derived Products    ??? Tylenol [Acetaminophen] Hives   ??? Zofran [Ondansetron Hcl] Nausea And Vomiting     Current Outpatient Medications   Medication Sig Dispense Refill   ??? aspirin 325 MG tablet Take 325 mg by mouth daily     ??? amitriptyline (  ELAVIL) 10 MG tablet Take 20 mg by mouth nightly     ??? ibuprofen (ADVIL;MOTRIN) 600 MG tablet Take 1 tablet by mouth 3 times daily as needed for Pain 24 tablet 0   ??? oxyCODONE (ROXICODONE) 5 MG immediate release tablet Take 1 tablet by mouth every 8 hours as needed for Pain for up to 5 days. Intended supply: 3 days. Take lowest dose possible to manage pain 9 tablet 0     No current facility-administered medications for this visit.      Past Medical History:   Diagnosis Date   ??? Cerebral artery occlusion with cerebral infarction [facility])    ??? Hyperlipidemia      Past Surgical History:   Procedure Laterality Date   ??? APPENDECTOMY     ??? BREAST SURGERY     ??? HYSTERECTOMY       Social History     Socioeconomic History   ??? Marital status: Married     Spouse name: Not on file   ??? Number of children: Not on file   ??? Years of education: Not on file   ??? Highest education level: Not on file   Occupational History   ??? Not on file   Social Needs   ??? Financial resource strain: Not on  file   ??? Food insecurity     Worry: Not on file     Inability: Not on file   ??? Transportation needs     Medical: Not on file     Non-medical: Not on file   Tobacco Use   ??? Smoking status: Former Smoker   ??? Smokeless tobacco: Never Used   Substance and Sexual Activity   ??? Alcohol use: Never     Frequency: Never   ??? Drug use: Yes     Types: Marijuana   ??? Sexual activity: Not on file   Lifestyle   ??? Physical activity     Days per week: Not on file     Minutes per session: Not on file   ??? Stress: Not on file   Relationships   ??? Social Wellsite geologist on phone: Not on file     Gets together: Not on file     Attends religious service: Not on file     Active member of club or organization: Not on file     Attends meetings of clubs or organizations: Not on file     Relationship status: Not on file   ??? Intimate partner violence     Fear of current or ex partner: Not on file     Emotionally abused: Not on file     Physically abused: Not on file     Forced sexual activity: Not on file   Other Topics Concern   ??? Not on file   Social History Narrative   ??? Not on file     No family history on file.  Vitals:    03/27/19 1142   Temp: 97.7 ??F (36.5 ??C)       Physical Exam   This is an age appropriate appearing female who is alert and oriented x 3.  The patient appears well nourished.  Psychiatric: The patient is able to verbalize normally and seems to have a good understanding of her situation.    HENT:   Head: Normocephalic.   Right Ear: External ear normal.   Left Ear: External ear normal.   Nose: Nose normal.   Neck:  Normal range of motion.   Pulmonary/Chest: Effort normal. No accessory muscle usage. No respiratory distress.   Abdominal: Normal appearance.      The Left lower extremity is examined.    Skin:  Skin is warm and dry and ruborous in a dependent position which resolves with elevation    Lymphatic:  No lymphadenopathy  Moderate swelling of the ankle    Vascular:  Capillary refill in the foot/toes is less than 3  seconds.      Neurologic:  Sensation is intact to light touch throughout the foot and the ankle  .   Reflexes not evaluated secondary to injury    Musculoskeletal:  The calf is nontender to palpation.  Muscle strength testing limited secondary to injury but Jennye was able to actively dorsiflex, plantarflex, invert and evert the foot and ankle through short arcs of motion    Alignment:  No apparent malalignment of the ankle, foot or toes      ROM: Decreased ROM of the foot and ankle    Areas of Tenderness: tenderness to palpation noted over the lateral malleolus.  The medial ankle is nontender to palpation.  A proximal tibia-fibula squeeze did not cause pain.  No areas of tenderness are noted in the hindfoot, midfoot or forefoot. The compartments of the leg and foot are soft and compressible  passive ROM of the toes does not cause pain    No clinical photos were taken during today's visit     Previous Notes/Records Reviewed:  The Emergency Department encounter notes were reviewed.    Radiographic finding:     I reviewed the Makennah's previous x-ray examination and interpreted by myself and I reviewed the findings with Keaghan during the visit today.  3 nonweightbearing views of the left ankle and a gravity stress view of the left ankle were reviewed.  There is a Weber B lateral malleolus fracture with mild displacement.  On the gravity stress view the medial ankle mortise clear space widened to 4.6 mm.  No signs of syndesmotic instability were seen on the x-rays.    Lab Result Review:  No labs were reviewed/no labs were available for review this visit.    IMPRESSION:      ICD-10-CM    1. Closed displaced fracture of lateral malleolus of left fibula, initial encounter  S82.62XA PR CLOSED TX DIST FIBULA FX   2. Acquired deformity of left toe  M20.62        MEDICAL DECISION MAKING:    I had a discussion with Yoltzin to make sure she had a good understanding of what I think the main issues and diagnoses are that are  affecting her today, and reviewed the plan going forward.    I explained to Caly that her fracture of the Left lateral malleolus can be treated with closed management without the need for manipulation.  I explained that another treatment option is surgical reduction and fixation of the injury, however surgery carries risks that non-surgical care does not.  I explained that there are times where this type of injury fails to heal or its alignment can become unaccepted even with proper closed management and I could at some point recommend surgical intervention in the future.  This type of scenario happens infrequently but I made it clear that it can happen.  I also explained that closed management will only work if she follows all instructions regarding care of the injury which was discussed in detail at today's visit.  I  also explained that her type of injury can take 12-18 months for full recovery of function and some individuals can have some long-term issues even after the injury is clinically healed.    After considering both surgical and non-surgical options Gurbani chose to pursue non-surgical management.    Auburn will stay in the aircast boot that she was given in the ER.      Taura was instructed to be weight bearing as tolerated on the Left lower extremity in the Aircast boot.  If Jerline has any problems maintaining this weight bearing status she was instructed to call me so that appropriate instructions can be given.      Ortha was instructed to elevate the Left lower extremity for comfort.  I explained that elevation means keeping the toes at the level of her nose.  If her leg is not elevated to that height then it is not truly elevated.    I want to see Jahira back in approximately 6 weeks for revaluation and she will  need xrays consisting of 3 weight bearing views of the left ankle    At the next visit I will fully evaluate her toe deformity and determine if any surgical intervention would be  helpful.  I briefly mentioned percutaneous flexor tenotomies if she has flexible deformities.  I told her I will wait till the next visit to make sure that her ankle is healing appropriately prior to starting any treatments for her toe deformities.    If Tasmia feels that the above plan of treatment is going poorly or if she is having problems with the above plan he was instructed to call my office to determine if the plan of care needs to be changed or if she needs to be seen sooner than noted above.    Return in about 6 weeks (around 05/08/2019).    Voice recognition was used for portions of this note and although it was reviewed prior to signing some incorrect words or phrases could be present.    Electronically signedby Loleta Books, MD on 03/27/19 at 11:42 AM EST

## 2019-05-08 ENCOUNTER — Ambulatory Visit
Admit: 2019-05-08 | Discharge: 2019-05-08 | Payer: MEDICARE | Attending: Foot and Ankle Surgery | Primary: Internal Medicine

## 2019-05-08 ENCOUNTER — Encounter: Admit: 2019-05-08 | Discharge: 2019-05-08 | Payer: MEDICARE | Primary: Internal Medicine

## 2019-05-08 DIAGNOSIS — S8262XD Displaced fracture of lateral malleolus of left fibula, subsequent encounter for closed fracture with routine healing: Secondary | ICD-10-CM

## 2019-05-08 NOTE — Progress Notes (Signed)
[facility]  [facility] [place]   [address]  [address]  [place] [address]  Dept: [phone]  Loc: [phone]          Trish Mancinelli  September 17, 1970  [id number]  05/08/2019    HPI  Connie Lyons returns for re-evaluation of her Left lateral malleolus fracture. Her DOI was on 03/22/19    Connie Lyons reports that her pain has decreased since the last visit    Connie Lyons reports that her swelling  has been minimal    Connie Lyons denies calf pain and shortness of breath    Connie Lyons has been weight bearing as tolerated on the Left lower extremity in in an Aircast boot which she has been taking off for sleep and ROM.     In general Connie Lyons feels that she  is doing better than the her last visit    Other complaints or issues: if she can get out of the boot      Review of Systems   Surgical Risk Factors:              Allergies to Metals or Latex: NO              Have you been treated for a blood clot:  Yes, lower left               Have you had a history of bleeding disorder:  Clotting disorder               Have you had a history of Anesthetic problems: NO              Do you have tendency to bruise easily:  Yes, ASA               Do you experience prolonged or excessive bleeding from cuts or after       surgery:NO  General/Constitutional:                General: NO              Cancer:  NO              Acute/Chronic Infections: NO  HEENT/Neck:              Problems with theThroat:  NO              Problems with the Eyes:  NO              Problems with the Ears:  NO              Problems with the Nose and Sinuses:  NO  Endocrine:              Problems with Diabetes: NO              Problems with Thyroid Disorder: NO  Thorax:                Problems with the Heart: NO              Problems with the Lung:  NO  Cardiovascular:              Problems with Circulation:  NO              Problems with High Blood pressure:  NO  Gastrointestinal:              Problems with Ulcers:  NO  Problems with the Liver:  NO              Problems with Bowel Habits:  NO  Genitourinary:              Problems with the Genitals: NO              Urinary problems: NO              Kidney disease or stones:  NO  Skin:               Any general problems:  NO  Neurologic:              Dizziness, blurred vision, headaches, problems with balance :  NO              Seizures or Stroke: massive stroke 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    Allergies   Allergen Reactions   ??? Cymbalta [Duloxetine Hcl]    ??? Dilaudid [Hydromorphone Hcl]    ??? Fish-Derived Products    ??? Tylenol [Acetaminophen] Hives   ??? Zofran [Ondansetron Hcl] Nausea And Vomiting     Current Outpatient Medications   Medication Sig Dispense Refill   ??? aspirin 325 MG tablet Take 325 mg by mouth daily     ??? amitriptyline (ELAVIL) 10 MG tablet Take 20 mg by mouth nightly     ??? ibuprofen (ADVIL;MOTRIN) 600 MG tablet Take 1 tablet by mouth 3 times daily as needed for Pain 24 tablet 0     No current facility-administered medications for this visit.      Past Medical History:   Diagnosis Date   ??? Cerebral artery occlusion with cerebral infarction [facility])    ??? Hyperlipidemia      Past Surgical History:   Procedure Laterality Date   ??? APPENDECTOMY     ??? BREAST SURGERY     ??? HYSTERECTOMY       Social History     Socioeconomic History   ??? Marital status: Married     Spouse name: Not on file   ??? Number of children: Not on file   ??? Years of education: Not on file   ??? Highest education level: Not on file   Occupational History   ??? Not on file   Social Needs   ??? Financial resource strain: Not on file   ??? Food insecurity     Worry: Not on file     Inability: Not on file   ??? Transportation needs     Medical: Not on file     Non-medical: Not on file   Tobacco Use   ??? Smoking status: Former Smoker   ??? Smokeless tobacco: Never Used   Substance and Sexual Activity   ???  Alcohol use: Never     Frequency: Never   ??? Drug use: Yes     Types: Marijuana   ??? Sexual activity: Not on file   Lifestyle   ??? Physical activity     Days per week: Not on file     Minutes per session: Not on file   ??? Stress: Not on file   Relationships   ??? Social Product manager on phone:  Not on file     Gets together: Not on file     Attends religious service: Not on file     Active member of club or organization: Not on file     Attends meetings of clubs or organizations: Not on file     Relationship status: Not on file   ??? Intimate partner violence     Fear of current or ex partner: Not on file     Emotionally abused: Not on file     Physically abused: Not on file     Forced sexual activity: Not on file   Other Topics Concern   ??? Not on file   Social History Narrative   ??? Not on file     No family history on file.  Vitals:    05/08/19 1129   BP: (!) 115/57   Pulse: 101   Temp: 97.4 ??F (36.3 ??C)       Physical Exam   This is an age appropriate appearing female who is alert and oriented x 3.  The patient appears well nourished.  Psychiatric: The patient is able to verbalize normally and seems to have a good understanding of her situation.    HENT:   Head: Normocephalic.   Right Ear: External ear normal.   Left Ear: External ear normal.   Nose: Nose normal.   Neck: Normal range of motion.   Pulmonary/Chest: Effort normal. No accessory muscle usage. No respiratory distress.   Abdominal: Normal appearance.      The Left lower extremity is examined.    Skin:  Skin is warm and dry    Lymphatic:  No lymphadenopathy  Moderate swelling of the ankle    Vascular:  Capillary refill in the foot/toes is less than 3 seconds.      Neurologic:  Sensation is intact to light touch throughout the foot and the ankle  .   Reflexes not evaluated secondary to injury    Musculoskeletal:  The calf is nontender to palpation.  Muscle strength testing limited secondary to injury but Connie Lyons was able to actively dorsiflex, plantarflex,  invert and evert the foot and ankle through short arcs of motion    Alignment:  No apparent malalignment of the ankle, foot or toes      ROM: Decreased ROM of the foot and ankle    Areas of Tenderness: no areas of tenderness with palpation were found The compartments of the leg are soft and compressible  Connie Lyons can wiggle her toes without pain    See today's clinical photo(s) below     Previous Notes/Records Reviewed:  No outside notes reviewed for this encounter.    Radiographic finding:    3 weightbearing views of the left ankle were obtained and interpreted by myself and I reviewed the findings with Connie Lyons during the visit today.   The ankle mortise and syndesmosis are anatomically aligned.  The Weber B lateral malleolus fracture shows interval bone healing with no further displacement or shortening.    Lab Result Review:  No labs were reviewed/no labs were available for review this visit.    IMPRESSION:      ICD-10-CM    1. Closed displaced fracture of lateral malleolus of left fibula with routine healing  S82.62XD XR ANKLE LEFT (MIN 3 VIEWS)     Brace   2. Closed displaced fracture of lateral malleolus of left fibula, initial encounter  S82.62XA        MEDICAL DECISION MAKING:  I had a discussion with Connie Lyons to make sure she had a good understanding of what I think the main issues and diagnoses are that are affecting her today, and reviewed the plan going forward.    I explained to Connie Lyons that her fracture of the Left lateral malleolus is in acceptable alignment and is progressively healing as expected  As a result it was decided to allow Connie Lyons to start transitioning from her aircast boot to a lace-up ankle brace and a shoe. I want Connie Lyons to start by trying to walk in their shoe for one hour today and increase her shoe wear by one hour per day until she is able to spend a whole day in the shoe and brace without pain.  I instructed Connie Lyons to put the  aircast boot back on if she starts to have  increasing pain or swelling that is uncomfortable.  I want Connie Lyons to start with a good stable shoe that will allow for swelling if present.  If she is having trouble making the transition from the aircast boot to a shoe over the next few weeks then I want Connie Lyons to call me.    Connie Lyons was given a brace today and was instructed on its use and care.  I explained to Connie Lyons that the brace is meant to provide support to the ankle and it will restrict motion somewhat as well and it is hoped that this will help relieve some of her symptoms but may not completely resolve her current issue(s).  If she has any problems or concerns regarding the brace or if it is not helping, she was instructed to call the office.    Connie Lyons was instructed to be weight bearing as tolerated on the Left lower extremity in the lace up brace and a shoe.  If Connie Lyons has any problems maintaining this weight bearing status she was instructed to call me so that appropriate instructions can be given.      Connie Lyons was instructed to continue to elevate the Left lower extremity for comfort.  I explained that elevation means keeping the toes at the level of her nose.  If her leg is not elevated to that height then it is not truly elevated and she may continue to have swelling and discomfort as a result.    I want to see Connie Lyons back in approximately 6 weeks for revaluation and she will  need xrays consisting of 3 weight bearing views of the left ankle    If Connie Lyons feels that the above plan of treatment is going poorly or if she is having problems with the above plan Connie Lyons was instructed to call my office to determine if the plan of care needs to be changed or if she needs to be seen sooner than noted above.    At the next visit I will fully evaluate her toe deformity and determine if any surgical intervention would be helpful.  I briefly mentioned percutaneous flexor tenotomies if she has flexible deformities.  I told her I will wait till the next visit  to make sure that her ankle is healing appropriately prior to starting any treatments for her toe deformities.    Return in about 6 weeks (around 06/19/2019) for Repeat Xrays.    Voice recognition was used for portions of this note and although it was reviewed prior to signing some incorrect words or phrases could be present.    Electronically signedby Moreen Fowler, MD on 05/08/19 at 11:29 AM EDT

## 2019-06-19 ENCOUNTER — Ambulatory Visit
Admit: 2019-06-19 | Discharge: 2019-06-19 | Payer: MEDICARE | Attending: Foot and Ankle Surgery | Primary: Internal Medicine

## 2019-06-19 ENCOUNTER — Encounter: Admit: 2019-06-19 | Discharge: 2019-06-19 | Payer: MEDICARE | Primary: Internal Medicine

## 2019-06-19 DIAGNOSIS — M2062 Acquired deformities of toe(s), unspecified, left foot: Secondary | ICD-10-CM

## 2019-06-19 NOTE — Progress Notes (Signed)
[facility]   [address]  [place] [address]  Dept: [phone]  Loc: [phone]          Connie Lyons  May 03, 1970  [id number]  06/19/2019    HPI    Connie Lyons returns for re-evaluation of her Left lateral malleolus fracture.    Connie Lyons reports that her pain has been minimal    Connie Lyons reports that her swelling  has been minimal    Connie Lyons denies calf pain and shortness of breath    Connie Lyons has been weight bearing as tolerated on the Left lower extremity in a shoe.     In general Connie Lyons feels that she  is doing better than the her last visit    Other complaints or issues:  She wants to have surgery to straighten her lesser toes.  She has flexion contractures of the toes causing them curl when she walks and she walks on the tips of the toes causing pain and callusing.  In addition it makes it difficult for her balance because of the toe positions.    Review of Systems    Surgical Risk Factors:   Allergies to Metals or Latex: NO   Have you been treated for a blood clot:  NO   Have you had a history of bleeding disorder:  NO   Have you had a history of Anesthetic problems: NO   Do you have tendency to bruise easily:  NO   Do you experience prolonged or excessive bleeding from cuts or after  surgery:NO  General/Constitutional:     General: NO   Cancer:  NO   Acute/Chronic Infections: NO  HEENT/Neck:   Problems with theThroat:  NO   Problems with the Eyes:  NO   Problems with the Ears:  NO   Problems with the Nose and Sinuses:  NO  Endocrine:   Problems with Diabetes: NO   Problems with Thyroid Disorder: NO  Thorax:     Problems with the Heart: NO   Problems with the Lung:  NO  Cardiovascular:   Problems with Circulation:  NO   Problems with High Blood pressure:  NO  Gastrointestinal:   Problems with Ulcers:  NO   Problems with the Liver:  NO   Problems with Bowel Habits:  NO  Genitourinary:   Problems with the Genitals:  NO   Urinary problems: NO   Kidney disease or stones:  NO  Skin:    Any general problems:  NO  Neurologic:   Dizziness, blurred vision, headaches, problems with balance :  NO   Seizures or Stroke: NO  Psychiatric:   Emotional or Psychological disorders:  NO   Depression or Anxiety:  NO       Allergies   Allergen Reactions   ??? Cymbalta [Duloxetine Hcl]    ??? Dilaudid [Hydromorphone Hcl]    ??? Fish-Derived Products    ??? Tylenol [Acetaminophen] Hives   ??? Zofran [Ondansetron Hcl] Nausea And Vomiting     Current Outpatient Medications   Medication Sig Dispense Refill   ??? aspirin 325 MG tablet Take 325 mg by mouth daily     ??? amitriptyline (ELAVIL) 10 MG tablet Take 20 mg by mouth nightly     ??? ibuprofen (ADVIL;MOTRIN) 600 MG tablet Take 1 tablet by mouth 3 times daily as needed for Pain 24 tablet 0     No current facility-administered medications  for this visit.      Past Medical History:   Diagnosis Date   ??? Cerebral artery occlusion with cerebral infarction [facility])    ??? Hyperlipidemia      Past Surgical History:   Procedure Laterality Date   ??? APPENDECTOMY     ??? BREAST SURGERY     ??? HYSTERECTOMY       Social History     Socioeconomic History   ??? Marital status: Married     Spouse name: Not on file   ??? Number of children: Not on file   ??? Years of education: Not on file   ??? Highest education level: Not on file   Occupational History   ??? Not on file   Social Needs   ??? Financial resource strain: Not on file   ??? Food insecurity     Worry: Not on file     Inability: Not on file   ??? Transportation needs     Medical: Not on file     Non-medical: Not on file   Tobacco Use   ??? Smoking status: Former Smoker   ??? Smokeless tobacco: Never Used   Substance and Sexual Activity   ??? Alcohol use: Never     Frequency: Never   ??? Drug use: Yes     Types: Marijuana   ??? Sexual activity: Not on file   Lifestyle   ??? Physical activity     Days per week: Not on file     Minutes per session: Not on file   ??? Stress: Not on file   Relationships   ???  Social Wellsite geologist on phone: Not on file     Gets together: Not on file     Attends religious service: Not on file     Active member of club or organization: Not on file     Attends meetings of clubs or organizations: Not on file     Relationship status: Not on file   ??? Intimate partner violence     Fear of current or ex partner: Not on file     Emotionally abused: Not on file     Physically abused: Not on file     Forced sexual activity: Not on file   Other Topics Concern   ??? Not on file   Social History Narrative   ??? Not on file     No family history on file.  Vitals:    06/19/19 1253   BP: 98/80   Temp: 97.3 ??F (36.3 ??C)       Physical Exam     This is an age appropriate appearing female who is alert and oriented x 3.  The patient appears well nourished.  Psychiatric: The patient is able to verbalize normally and seems to have a good understanding of her situation.    HENT:   Head: Normocephalic.   Right Ear: External ear normal.   Left Ear: External ear normal.   Nose: Nose normal.   Neck: Normal range of motion.   Pulmonary/Chest: Effort normal. No accessory muscle usage. No respiratory distress.   Abdominal: Normal appearance.      The Left lower extremity is examined.    Skin:  Skin is warm and dry.    Mild callusing at the tips of the lesser toes    Lymphatic:  No lymphadenopathy  Minimal swelling of the ankle    Vascular:  Capillary refill in the foot/toes is  less than 3 seconds.      Neurologic:  Sensation is intact to light touch throughout the foot and the ankle  .    Hemiplegic gait    Musculoskeletal:  The calf is nontender to palpation.  Muscle strength testing deferred secondary to injury    Alignment:  No apparent malalignment of the ankle, foot or toes      ROM: Full ROM of the foot and ankle    Areas of Tenderness: no areas of tenderness with palpation were found The compartments of the leg and foot are soft and compressible   in a standing position flexion deformities of the lesser toes  are noted.  This continues with weightbearing and gait.  The lesser toes are passively correctable on exam.    No clinical photos were taken during today's visit       Previous Notes/Records Reviewed:  No outside notes reviewed for this encounter.    Radiographic finding:    3 weightbearing views of the left ankle were obtained and interpreted by myself and I reviewed the findings with Marina during the visit today.  Ankle mortise syndesmosis are well aligned.  The lateral malleolar fracture shows no further signs of shortening or translation.  Interval bone healing is noted at the lateral malleolar fracture site.    Lab Result Review:  No labs were reviewed/no labs were available for review this visit.    IMPRESSION:      ICD-10-CM    1. Acquired deformity of left toe  M20.62    2. Closed displaced fracture of lateral malleolus of left fibula with routine healing  S82.62XD XR ANKLE LEFT (MIN 3 VIEWS)       MEDICAL DECISION MAKING:    I had a discussion with Jenesys to make sure she had a good understanding of what I think the main issues and diagnoses are that are affecting her today, and reviewed the plan going forward.    I explained to [place] that her fracture of the Left lateral malleolus is in acceptable alignment and is progressively healing as expected  As a result it was decided to allow Shaterica to slowly begin to resume her normal activities.    Jasman was instructed to be weight bearing as tolerated on the Left lower extremity in a regular shoe.  If Cathren has any problems maintaining this weight bearing status she was instructed to call me so that appropriate instructions can be given.      I explained that as she increases her activity level there will likely be some level of discomfort in the area of the injury but if there is pain then she needs to stop the activity.  I explained that it can take up to 12 months to be able to participate in pre-injury activities.  If her pain is increasing she  needs to call for an appointment.    Morgann can continue to elevate the Left lower extremity for comfort if needed.     I had a long discussion with Hollace concerning her Left   Lesser toe deformities and went over her treatment options.    I explained to Emalie  Treatments other than surgery are available including extra-depth shoes with inserts.  Mecca and her family understand that there are other treatments available but would prefer to have the deformities corrected surgically.  We talked about correcting her Left second, third, fourth and fifth toe deformity with Left second, third, fourth and fifth toe percutaneous flexor  tenotomies.  I explained to [place] that this type of procedure is typically an outpatient procedure meaning she will be able to go home the day of surgery.  Yoali understands she will need a ride to and from the hospital and she will need someone to stay with her for a night after the surgery.  If for some reason Natahlia needs to be admitted to the hospital following the procedure, we can admit if necessary.  I explained Louine would need to be full weightbearing immediately following surgery .  I talked to [place] about the expected postoperative pain level following this type of procedure.  I explained that each patient's perception of pain is different and she could experience more pain or less pain than the average patient and that is something that I cannot fully predict.  I explained that she would receive a prescription for pain medication at the time of discharge from the hospital and may have an additional prescription for pain medications if necessary postoperatively.  I explained that there are risks associated with narcotic usage and I will determine the need for additional medication based on severity of the injury and her symptoms.   The risks of surgery were discussed including but not limited to the risks of medications given for surgery, the risk of blood loss  during and after surgery that can lead to the need for blood products in certain situations, infection, damage to normal structures that can lead to long term problems of pain or dysfunction, wound healing complications,  the possibility of nonunion or malunion for any bone procedures if they are required and late or chronic pain were also discussed.  I explained to [place] that surgery can potentially make their condition worse or can lead to other unexpected problems that can have long term effects on their function or comfort. In addition potentially life threatening complications at the time of surgery and after surgery were discussed including but not limited to deep vein thrombosis, pulmonary embolism, myocardial infarction, stroke and death. Keyra understands that no guarantees with regard to surgical outcome can be given and none were implied.   Chevonne was given the opportunity to ask questions and consider her options.  Katja would like to proceed with the above mentioned procedure.    Return for Post op, Call our office with questions at [phone], Scheduled Surgery.    Voice recognition was used for portions of this note and although it was reviewed prior to signing some incorrect words or phrases could be present.    Electronically signedby Moreen Fowler, MD on 06/19/19 at 12:53 PM EDT

## 2019-06-19 NOTE — Progress Notes (Signed)
SURGERY SCHEDULING SHEET     Procedure: Left Tenotomy percutaneous 2nd, 3rd,4th and 5thtoe/toes (28011 T1,2,3,4)    CPT codes: see above    Diagnosis: Acquired deformity of left toe [M20.62]    Location for Surgery: Any location (patient choice)    Schedule for: 30 minutes    Admission Type: Outpatient    Medical Clearance: No    Antibiotic: Ancef 2g IV    Anesthesia: General    Position and Table type: Supine on Regular OR table    Radiology: None    Implants: none    Equipment:

## 2019-06-19 NOTE — Telephone Encounter (Signed)
Patient scheduled with surgery on 6/8, arrival 8am, 10am surgery. PAT will be scheduled week prior any day and time at [facility], Confirmed cane for DOS,

## 2019-06-20 NOTE — Telephone Encounter (Signed)
Procedure: Left Tenotomy percutaneous 2nd, 3rd,4th and 5thtoe/toes (27741 T1,2,3,4)  ??  CPT codes: see above  ??  Diagnosis: Acquired deformity of left toe [M20.62]  ??  Location for Surgery: Any location (patient choice)  ??  Schedule for: 30 minutes  ??  Admission Type: Outpatient  ??  Medical Clearance: No  ??  Antibiotic: Ancef 2g IV  ??  Anesthesia: General  ??  Position and Table type: Supine on Regular OR table  ??  Radiology: None  ??  Implants: none  ??  Equipment:

## 2019-06-26 NOTE — Telephone Encounter (Signed)
Name of Caller: Connie Lyons Phone Number: [phone]    Reason for Appointment: New Patient, BOTOX    Office Name: Neurology    Medication Refills need, if any: n/a    Medication Name: n/a

## 2019-07-01 NOTE — Telephone Encounter (Signed)
Lm for pt to get a referral from PCP. With records in regards to what she is being seen for.

## 2019-07-23 ENCOUNTER — Inpatient Hospital Stay: Admit: 2019-07-23 | Attending: Foot and Ankle Surgery | Primary: Internal Medicine

## 2019-07-23 LAB — BASIC METABOLIC PANEL
Anion Gap: 8 mmol/L (ref 3–13)
Anion Gap: 8 mmol/L (ref 3–13)
BUN: 11 mg/dL (ref 7–20)
BUN: 11 mg/dL (ref 7–20)
CO2: 23 mmol/L (ref 22–30)
CO2: 24 mmol/L (ref 22–30)
Calcium: 9.2 mg/dL (ref 8.4–10.4)
Calcium: 9.2 mg/dL (ref 8.4–10.4)
Chloride: 105 mmol/L (ref 98–107)
Chloride: 106 mmol/L (ref 98–107)
Creatinine: 0.67 mg/dL (ref 0.52–1.25)
Creatinine: 0.68 mg/dL (ref 0.52–1.25)
EGFR IF NonAfrican American: >90 mL/min (ref >60)
Glucose: 93 mg/dL (ref 70–100)
Glucose: 94 mg/dL (ref 70–100)
Potassium: 3.9 mmol/L (ref 3.5–5.1)
Potassium: 4 mmol/L (ref 3.5–5.1)
Sodium: 136 mmol/L (ref 135–145)
Sodium: 136 mmol/L (ref 135–145)
eGFR AA: >90 mL/min (ref >60)
eGFR African American: >90 mL/min (ref >60)
eGFR NON-AA: >90 mL/min (ref >60)

## 2019-07-23 LAB — CBC
Hematocrit: 38.1 % (ref 35.0–47.0)
Hemoglobin: 12.9 g/dL (ref 11.7–16.0)
MCH: 31.5 pg (ref 26.0–34.0)
MCHC: 33.9 % (ref 32.0–36.0)
MCV: 93 fL (ref 79.0–98.0)
MPV: 9.1 fL (ref 7.4–10.4)
Platelets: 260 10*3/uL (ref 140–440)
RBC: 4.1 10*6/uL (ref 3.80–5.20)
RDW: 13.2 % (ref 11.5–14.5)
WBC: 6.9 10*3/uL (ref 3.6–10.7)

## 2019-07-23 NOTE — H&P (Signed)
Comprehensive PreSurgical History and Physical      Name: Connie Lyons MRN: [id number] DOB: 09/03/1970    (Age-49 y.o.)     Date of Service: Pt seen/examined on 07/23/2019     Chief Complaint: LEFT ANKLE PAIN DUE TO INJURY AS SHE FELL IN January AND WENT TO [facility] ER     History Of Present Illness:    49 y.o. female who we are asked to see/evaluate by Moreen Fowler, MD for pre-operative evaluation prior to Procedure Notes:  LEFT TENOTOMY PERCUTANEOUS 2ND,  3RD,4TH AND 5TH TOE/TOES  GENERAL ANESTHESIA.  ON 07/30/2019   AND PATIENT IS HERE IN PAT ALONG WITH HER HUSBAND  AND AS PER ORTHO  NOTES   Connie Lyons is a 49 y.o. female who is here today for evaluation of her Left ankle  ??Connie Lyons has a chief complaint of afracture  ??Connie Lyons reports the current issuestarted approximately 5 day(s) ago.   She fell and injured the left ankle.  She was seen in the emergency department and placed in a boot.  She has been weightbearing on the left ankle since the injury.  She feels that she often has issues with her foot and ankle because her toes curl.  She has a neuromuscular disorder and because of the curled toes has difficulty bearing weight at times.  She asked if there are any treatments for the toes.  She has had Botox injections in the past that temporarily correct the problem.  ??Current symptoms: pain, swelling and discoloration  ??Katarina has seen another medical professional for this problem: Yes  ??Connie Lyons has tried or been treated with thefollowing: NSAIDs or other OTC medications, a brace/splint/boot and walking aids (cane,crutches,walker,etc)  ??  Past Medical History:        Diagnosis Date   ??? Asthma    ??? Cerebral artery occlusion with cerebral infarction (HCC)     2012   ??? H/O cardiovascular stress test    ??? Hx of blood clots     LEFT CALF - 2012    ??? Hyperlipidemia    ??? PONV (postoperative nausea and vomiting)     NAUSEA       Past Surgical History:        Procedure Laterality Date   ??? APPENDECTOMY     ??? BREAST  SURGERY      IMPLANTS   ??? COLONOSCOPY     ??? ENDOSCOPY, COLON, DIAGNOSTIC     ??? HYSTERECTOMY     ??? WISDOM TOOTH EXTRACTION         Medications Prior to Admission:    Prior to Admission medications    Medication Sig Start Date End Date Taking? Authorizing Provider   magnesium 30 MG tablet Take 250 mg by mouth nightly   Yes Historical Provider, MD   atorvastatin (LIPITOR) 20 MG tablet Take 20 mg by mouth nightly   Yes Historical Provider, MD   albuterol sulfate HFA (VENTOLIN HFA) 108 (90 Base) MCG/ACT inhaler Inhale 2 puffs into the lungs every 6 hours as needed for Wheezing   Yes Historical Provider, MD   omeprazole (PRILOSEC) 20 MG delayed release capsule Take 20 mg by mouth every morning (before breakfast)   Yes Historical Provider, MD   venlafaxine (EFFEXOR XR) 150 MG extended release capsule Take 150 mg by mouth every morning (before breakfast)   Yes Historical Provider, MD   aspirin 81 MG chewable tablet Take 81 mg by mouth every morning (before breakfast)   Yes Historical Provider, MD  metoprolol succinate (TOPROL XL) 25 MG extended release tablet Take 25 mg by mouth nightly   Yes Historical Provider, MD   Diclofenac Potassium,Migraine, 50 MG PACK Take 50 mg by mouth 2 times daily as needed   Yes Historical Provider, MD   cyclobenzaprine (FLEXERIL) 10 MG tablet Take 10 mg by mouth nightly   Yes Historical Provider, MD   dicyclomine (BENTYL) 20 MG tablet Take 20 mg by mouth every 4 hours as needed   Yes Historical Provider, MD   amitriptyline (ELAVIL) 10 MG tablet Take 25 mg by mouth nightly     Historical Provider, MD   ibuprofen (ADVIL;MOTRIN) 600 MG tablet Take 1 tablet by mouth 3 times daily as needed for Pain 03/22/19 03/30/19  Nancy Marus, PA       ANTICOAGULATION: No  CHRONIC STEROID USE:  No  CHRONIC NARCOTIC USE: No    Allergies:  Cymbalta [duloxetine hcl], Dilaudid [hydromorphone hcl], Fish-derived products, Tylenol [acetaminophen], and Zofran [ondansetron hcl]    If patient has opioid allergy, is it  okay to take Acetaminophen: Yes      Social History:   TOBACCO:   reports that she has quit smoking. She has never used smokeless tobacco.  ETOH:   reports no history of alcohol use.  Social History     Substance and Sexual Activity   Drug Use Yes   ??? Types: Marijuana    Comment: 1 OR 2 TIMES PER WEEK        Family History:  History reviewed. No pertinent family history.    REVIEW OF SYSTEMS:   Pertinent positives as noted in the HPI. All other systems reviewed and negative except for .ACQUIRED  DEFORMITY OF LEFT TOES    PHYSICAL EXAM:  Vitals:  BP 102/72    Pulse 96    Temp 97.4 ??F (36.3 ??C) (Temporal)    Resp 16    Ht 5\' 1"  (1.549 m)    Wt 181 lb 6 oz (82.3 kg)    SpO2 97%    BMI 34.27 kg/m??   BMI Classification:  Obese (BMI 30.0-39.9)  General appearance: No apparent distress, appears stated age and cooperative.  HEENT: Normal cephalic, atraumatic without obvious deformity. Pupils equal, round, and reactive to light.  Extra ocular muscles intact. Conjunctivae/corneas clear.  Neck: No jugular venous distention. Trachea midline.  Respiratory:  Normal respiratory effort. Clear to auscultation, bilaterally without Rales/Wheezes/Rhonchi.  Cardiovascular: Regular rate and rhythm with normal S1/S2 without murmurs, rubs or gallops.  Abdomen: Soft, non-tender, non-distended with normal bowel sounds.  Musculoskeletal: No clubbing, cyanosis or edema bilaterally.  LEFT  SIDED WEAKNESS DUE TO CVA   Skin: Skin color, texture, turgor normal.  No rashes or lesions.  Neurologic:  Neurovascularly intact without any focal sensory/motor deficits. Cranial nerves: II-XII intact, grossly non-focal.  Psychiatric: Alert and oriented, thought content appropriate, normal insight    Labs:   Lab Results   Component Value Date    WBC 6.9 07/23/2019    HGB 12.9 07/23/2019    HCT 38.1 07/23/2019    MCV 93.0 07/23/2019    PLT 260 07/23/2019     Lab Results   Component Value Date    NA 136 07/23/2019    K 3.9 07/23/2019    CL 105 07/23/2019     CO2 24 07/23/2019    BUN 11 07/23/2019    CREATININE 0.67 07/23/2019    GLUCOSE 93 07/23/2019    CALCIUM 9.2 07/23/2019  Lee's Simple Cardiac Risk Index:  High-risk surgery (intraperitoneal, intrathoracic or suprainguinal vascular surgery): yes - 1 Point  Coronary artery disease: no  Congestive heart failure: no  History of cerebrovascular disease: no  Insulin treatment for diabetes mellitus: no  Preoperative serum creatinine > 2.0mg /dL: no    Total:   Interpretation:  0 Points Class I   1 Point Class II   2 Points Class III   >=3 Points Class IV        OSA Screening (STOP-Bang)  NO NOT COMPLETE IF ALREADY DIAGNOSED WITH OSA  Do you snore loudly (loud enough to be heard through closed doors, or your bed partner elbows you for snoring at night)?yes - 1 Point    Do you often feel tired, fatigued, or sleepy during the daytime (such as falling asleep during driving)?no    Has anyone observed you stop breathing or choking/gasping during your sleep?no    Do you have or are being treated for high blood pressure? No     Body mass index more than 35 kg/m2?  no    Age older than 50 years old? no    Neck size large? (measured around Adam's apple)  For female, is your shirt collar 17 inches or larger?  no  For female, is your shirt collar 16 inches or larger?no    Gender = Female? no    STOP BANG SCORE = 1    Information from PAT significant to anesthetic history as recorded by Lindsay Straka Genia Plants, PA  on 07/23/2019        Anesthesia Component:    Any problems with anesthesia?: Past General Anesthetic with mild PONV  History of difficult intubation?: None reported  History of difficult IV access?: None reported  Family History of problems with anesthesia?: None noted   Risk Factors PONV:    Female: Note:Connie Lyons is female  Motion sick or prior PONV: Yes (+1)  NonSmoker: FORMER SMOKER   Opiods for Procedure: no            Airway and Dentition Patient PAT Education - Risk Assesment      ASA Score:ASA 2 - Mild lung  disease  Mallampati  PAT assesment: 2  TMD: >4cm  Mouth Size: WNL  Neck: WNL  Dentures: None  Partials: None  Overall Dentition: Missing teeth top and bottom,  GERD:Controlled      Frailty Risk Assessment: FRAILTY RISK IF >2 years old - Pt is 49 y.o.         Low physical activity (+1)  Slow walking (+1)No Risk Factors unless listed   Patient received procedure specific education prior to surgery per nursing     Tobacco:FORMER SMOKER     ETOH:  reports no history of alcohol use.  Drug:   Social History     Substance and Sexual Activity   Drug Use Yes   ??? Types: Marijuana    Comment: 1 OR 2 TIMES PER WEEK        Functional Capacity: (>4 METS implies low cardiovascular risk from surgery):  Do light work around the house, such as dusting or washing dishes (2.70 METs), Take care of self, that is eating, dressing, bathing, using the toilet (2.75 METs) and USE  WHHEL CHAIR  DUE TO LEFT SIDE WEAKNESS DUE TO CVA     PAT Pain Score:7 LEFT ANKLE AND FOOT     Postop Pain Management Plan (Pain consult ordered?): NONE  EKG:    DONE TO-DAY                                                                                       SINUS RHYTHM  CONSIDER RIGHT ATRIAL ABNORMALITY  BASELINE WANDER IN LEAD(S) I,II,aVR,aVL  No previous ECG available for comparison                                                  ECHO and FT:DDUK on file   No results found for: LVEF, LVEFMODE    ASSESSMENT/PLAN:  Patient is considered intermediate  risk for this intermediate risk procedure/surgery (Procedure Notes:  LEFT TENOTOMY PERCUTANEOUS 2ND,  3RD,4TH AND 5TH TOE/TOES  GENERAL ANESTHESIA) with no reducible risk factors. Based on the above evaluation, the benefits of the planned procedure likely exceed the risks.  The patient is medically optimized to proceed with the planned procedure without any further cardiopulmonary testing.    1)  ACQUIRED  DEFORMITY OF LEFT TOES ; SCHEDULED FOR THE SURGERY            2)  H/O CVA ; LEFT  SIDED WEAKNESS           3)  ASTHMA ; ON VENTOLIN        4)  H/O TACHYCARDIA/PALPIRTATIONS; ON METOPROLOL   5) HYPERLIPIDEMIA ; ON LIPITOR   6)  H/OPONV   7) IBS ; ON BENTYL                                                                                             8) ANXIETY/DEPRESSION ;ON EFFEXOR                                                   9) INSOMNIA ; ON ELAVIL   Labs Ordered:  YES - PER PAT PROTOCOL  COVID-19 TESTING ORDERED: []   ECG Ordered:  YES -PER PAT  PROTOCOL   Sleep Referral Ordered:  NO - NEGATIVE SCREEN PER SLEEP REFERRAL PROTOCOL          Electronically signed by: Doninique Lwin Asencion Noble, PA     Date: 07/23/2019 at 4:36 PM               PAT Protocol referenced includes:  Anesthesia Lab Protocol Orders  Perioperative Cardiovascular Risk Assessment  Anesthesia Assessment   Pain Assessment and Acute Pain Service Consult (if appropriate)  Medical Clearance/Consult from Internal Medicine ([facility])  Shower/Wash Order (for designated surgeries)  OSA Screen and Sleep  Clinic Referral (if appropriate)

## 2019-07-23 NOTE — Discharge Instructions (Signed)
        IF YOU USE A CPAP MACHINE OR RESCUE INHALER AT HOME PLEASE BRING THESE ITEMS WITH YOU THE DAY OF SURGERY.      MEDICATION INSTRUCTIONS PRIOR TO SURGERY    PLEASE BRING PROVIDED LIST BACK WITH YOU THE DAY OF SURGERY WITH DATE/TIME LAST DOSE OF MEDICATIONS TAKEN.     CONTINUE TAKING ASA AND PRILOSEC EVEN AM OF SURGERY                  During pre-admission testing appointment, patient instructed on the following  To arrive 2 hours prior to scheduled surgery  Upon arrival, stop in registration just past the main entrance and provide them with a photo id and a medical card if they have one  After registration, come to the same day surgery department, stopping at the main desk  They need to have made arrangements for a ride home following surgery and a phone number will need to be provided before going back to the operating room.  If public transportation will be used after surgery, they are made aware that a responsible adult needs to accompany them.  They need to make arrangements to have someone with them when they get home from surgery.  Leave all jewelry, contacts and valuables at home  Wear loose comfortable clothing to go home in  Bring in the medication list provided for them and write in the date/time last dose was taken  No food (including candy, gum and mints) the day of surgery  They may have clear liquids ( water, black coffee/no liquid or powder creamer, clear tea, clear fruit juices/no pulp and carbonated beverages) up until 2 hours prior to surgery  Do not drink alcohol, use recreational drugs or smoke/use nicotine products 24 hours prior to surgery.  Encouraged to write down any questions they may have for the surgeon, anesthesiologist or any member of the surgical team, and to bring list of questions in with them  To not shave the surgical site and to follow instructions provided on showering with the CHG solution    During the pre-admission testing appointment, this nurse reviewed and provided  patient with   The taking care of yourself after surgery paper  Billing information for anesthesia patients  Preparing for your surgical procedure pamphlet  After visit Summary with medication list and medication instructions  The CHG solution and shower card with instruction.  Pt encouraged to follow instructions on card     Prior to end of PAT appointment, pt acknowledged understanding of information and instructions provided in preparation of upcoming surgery.

## 2019-07-30 ENCOUNTER — Ambulatory Visit: Attending: Foot and Ankle Surgery | Primary: Internal Medicine

## 2019-08-14 ENCOUNTER — Encounter: Attending: Physician Assistant | Primary: Internal Medicine

## 2019-09-20 NOTE — Telephone Encounter (Signed)
Asking if approval from vascular doctor   DR Carlyon Shadow from [facility] has sent approval for her surgery?  Please call [phone]

## 2019-09-20 NOTE — Telephone Encounter (Signed)
Called and spoke with patient's husband, made him aware that we have not received clearance. I will call and check status.

## 2019-09-23 NOTE — Telephone Encounter (Addendum)
Called and spoke with Darlene in Dr. Katherine Roan office, she states that she just started yesterday and not sure if this was received. She advised me to re-fax to her at [phone] [facility]. She states that she promises this will be dealt with tomorrow.

## 2019-09-23 NOTE — Telephone Encounter (Signed)
Your fax has been successfully sent to Clearance  at [phone].  ------------------------------------------------------------    ------------------------------------------------------------  09/23/2019 3:25:35 PM Transmission Record   Sent to [phone] with remote ID "[facility]"   Result: (0/339;0/0) Success   Page record: 1 - 13   Elapsed time: 06:31 on channel 29

## 2019-10-01 ENCOUNTER — Ambulatory Visit: Admit: 2019-10-01 | Discharge: 2019-10-01 | Payer: MEDICARE | Attending: Neurology | Primary: Internal Medicine

## 2019-10-01 DIAGNOSIS — G43719 Chronic migraine without aura, intractable, without status migrainosus: Secondary | ICD-10-CM

## 2019-10-01 MED ORDER — DICLOFENAC POTASSIUM(MIGRAINE) 50 MG PO PACK
50 MG | Freq: Two times a day (BID) | ORAL | 11 refills | Status: DC | PRN
Start: 2019-10-01 — End: 2019-10-07

## 2019-10-01 NOTE — Progress Notes (Signed)
Department of Neurological Sciences  Initial Consult Note     10/01/19       CHIEF COMPLAINT:   Chief Complaint   Patient presents with   ??? New Patient     Migraines         Reason for Consult: 1.  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  Chronic migraine without aura, intractable    HISTORY OF PRESENT ILLNESS:     The patient is a 49 y.o.  female who presents with long history of chronic migraine without aura as well as a CVA with residual left spastic hemiplegia.  Today patient presented to the neurology clinic to get established as a new patient.  For the last several years patient has been followed at [facility] clinic.  Patient reports that she has been receiving Botox injections into the left arm because of severe spasticity and also she received Botox in the scalp and posterior cervical muscles as a treatment of her severe chronic migraine headaches.  Now patient wants to transfer care to our clinic in [place].  Patient denied any new neurological symptoms.  Her last Botox injections were in February 2021.  Normally patient gets 600 units of Botox 200 for the scalp posterior cervical muscles and 400 for the left arm.  Patient reported that after Botox injection she had very good control of her headaches and also the spasticity of her right arm improved significantly.  She denied any side effect from medication.          Past Medical History:    Past Medical History:   Diagnosis Date   ??? Acquired deformities of toe, left     SCHEDULED  FOR THE SURGERY ON 07/30/2019   ??? Asthma    ??? Cerebral artery occlusion with cerebral infarction (HCC)     2012  LEFT  SIDED WEAKNESS   ??? Former smoker    ??? H/O cardiovascular stress test    ??? Hx of blood clots     LEFT CALF - 2012    ??? Hyperlipidemia    ??? PONV (postoperative nausea and vomiting)     NAUSEA        Past Surgical History:   Past Surgical History:   Procedure Laterality Date   ??? APPENDECTOMY     ??? BREAST SURGERY      IMPLANTS   ??? COLONOSCOPY     ??? ENDOSCOPY,  COLON, DIAGNOSTIC     ??? HYSTERECTOMY     ??? WISDOM TOOTH EXTRACTION          Medications:   Current Outpatient Medications   Medication Sig Dispense Refill   ??? Diclofenac Potassium,Migraine, 50 MG PACK Take 50 mg by mouth 2 times daily as needed (migraine) 50 each 11   ??? magnesium 30 MG tablet Take 250 mg by mouth nightly     ??? atorvastatin (LIPITOR) 20 MG tablet Take 20 mg by mouth nightly     ??? albuterol sulfate HFA (VENTOLIN HFA) 108 (90 Base) MCG/ACT inhaler Inhale 2 puffs into the lungs every 6 hours as needed for Wheezing     ??? omeprazole (PRILOSEC) 20 MG delayed release capsule Take 20 mg by mouth every morning (before breakfast)     ??? venlafaxine (EFFEXOR XR) 150 MG extended release capsule Take 150 mg by mouth every morning (before breakfast)     ??? aspirin 81 MG chewable tablet Take 81 mg by mouth every morning (before breakfast)     ???  metoprolol succinate (TOPROL XL) 25 MG extended release tablet Take 25 mg by mouth nightly     ??? cyclobenzaprine (FLEXERIL) 10 MG tablet Take 10 mg by mouth nightly     ??? dicyclomine (BENTYL) 20 MG tablet Take 20 mg by mouth every 4 hours as needed     ??? amitriptyline (ELAVIL) 10 MG tablet Take 25 mg by mouth nightly      ??? ibuprofen (ADVIL;MOTRIN) 600 MG tablet Take 1 tablet by mouth 3 times daily as needed for Pain 24 tablet 0     No current facility-administered medications for this visit.        Allergies:  Cymbalta [duloxetine hcl], Dilaudid [hydromorphone hcl], Fish-derived products, Tylenol [acetaminophen], and Zofran [ondansetron hcl]    Social History:  Social History     Socioeconomic History   ??? Marital status: Married     Spouse name: Not on file   ??? Number of children: Not on file   ??? Years of education: Not on file   ??? Highest education level: Not on file   Occupational History   ??? Not on file   Tobacco Use   ??? Smoking status: Former Smoker   ??? Smokeless tobacco: Never Used   Vaping Use   ??? Vaping Use: Never used   Substance and Sexual Activity   ??? Alcohol use:  Never     Comment: SELDOM   ??? Drug use: Not Currently     Types: Marijuana     Comment: 1 OR 2 TIMES PER WEEK   ??? Sexual activity: Not on file   Other Topics Concern   ??? Not on file   Social History Narrative   ??? Not on file     Social Determinants of Health     Financial Resource Strain:    ??? Difficulty of Paying Living Expenses:    Food Insecurity:    ??? Worried About Programme researcher, broadcasting/film/videounning Out of Food in the Last Year:    ??? Baristaan Out of Food in the Last Year:    Transportation Needs:    ??? Freight forwarderLack of Transportation (Medical):    ??? Lack of Transportation (Non-Medical):    Physical Activity:    ??? Days of Exercise per Week:    ??? Minutes of Exercise per Session:    Stress:    ??? Feeling of Stress :    Social Connections:    ??? Frequency of Communication with Friends and Family:    ??? Frequency of Social Gatherings with Friends and Family:    ??? Attends Religious Services:    ??? Database administratorActive Member of Clubs or Organizations:    ??? Attends Engineer, structuralClub or Organization Meetings:    ??? Marital Status:    Intimate Programme researcher, broadcasting/film/videoartner Violence:    ??? Fear of Current or Ex-Partner:    ??? Emotionally Abused:    ??? Physically Abused:    ??? Sexually Abused:         Family History:   History reviewed. No pertinent family history.         REVIEW OF SYSTEMS:  Review of Systems   Constitutional: Positive for fatigue. Negative for activity change, appetite change, chills and fever.   HENT: Positive for tinnitus. Negative for dental problem, ear pain, facial swelling, mouth sores, rhinorrhea, sinus pressure and sinus pain.    Eyes: Negative for photophobia, pain and visual disturbance.   Respiratory: Negative for cough and chest tightness.    Cardiovascular: Negative for chest pain.   Gastrointestinal:  Positive for nausea. Negative for abdominal pain and vomiting.   Endocrine: Negative for cold intolerance and heat intolerance.   Genitourinary: Negative for difficulty urinating, hematuria and urgency.   Musculoskeletal: Positive for myalgias. Negative for arthralgias, gait problem, joint  swelling, neck pain and neck stiffness.   Skin: Negative for rash.   Allergic/Immunologic: Negative for environmental allergies and food allergies.   Neurological: Positive for weakness and headaches. Negative for dizziness, tremors, syncope, speech difficulty and numbness.        Spasticity, left spastic hemiparesis   Hematological: Does not bruise/bleed easily.   Psychiatric/Behavioral: Positive for dysphoric mood and sleep disturbance. Negative for confusion, decreased concentration and hallucinations. The patient is not nervous/anxious.         PHYSICAL EXAM:    Vitals:  Pulse 105    Temp 96.8 ??F (36 ??C)    Ht 5\' 1"  (1.549 m) Comment: pt reported   Wt 180 lb (81.6 kg) Comment: pt reported   SpO2 96%    BMI 34.01 kg/m??     Physical Exam  Vitals reviewed.   Constitutional:       General: She is not in acute distress.     Appearance: She is not ill-appearing.   HENT:      Head: Normocephalic.      Jaw: No tenderness, swelling or pain on movement.      Mouth/Throat:      Mouth: Mucous membranes are moist.   Eyes:      General: Lids are normal. Vision grossly intact. Gaze aligned appropriately. No visual field deficit or scleral icterus.     Extraocular Movements: Extraocular movements intact.      Right eye: Normal extraocular motion and no nystagmus.      Left eye: Normal extraocular motion and no nystagmus.      Conjunctiva/sclera: Conjunctivae normal.      Right eye: Right conjunctiva is not injected.      Pupils: Pupils are equal, round, and reactive to light.   Neck:      Thyroid: No thyromegaly.      Vascular: No carotid bruit.      Meningeal: Brudzinski's sign and Kernig's sign absent.   Cardiovascular:      Rate and Rhythm: Normal rate and regular rhythm.      Pulses: Normal pulses.      Heart sounds: Normal heart sounds, S1 normal and S2 normal. No murmur heard.     Pulmonary:      Effort: Pulmonary effort is normal.   Musculoskeletal:         General: No swelling, tenderness, deformity or signs of injury.  Normal range of motion.      Cervical back: Full passive range of motion without pain, normal range of motion and neck supple. No torticollis.      Right lower leg: No edema.   Lymphadenopathy:      Cervical: No cervical adenopathy.   Skin:     General: Skin is warm.      Findings: No bruising, ecchymosis, erythema, petechiae or rash.      Nails: There is no clubbing.   Neurological:      General: No focal deficit present.      Mental Status: She is alert and oriented to person, place, and time. Mental status is at baseline.      Cranial Nerves: Cranial nerve deficit and facial asymmetry present. No dysarthria.      Sensory: Sensory deficit present.  Motor: Weakness, atrophy, abnormal muscle tone and pronator drift present. No tremor or seizure activity.      Coordination: Coordination is intact. Romberg sign negative. Coordination normal. Finger-Nose-Finger Test and Heel to [facility] Test normal. Rapid alternating movements normal.      Gait: Gait abnormal.      Deep Tendon Reflexes: Reflexes normal. Babinski sign absent on the right side. Babinski sign present on the left side.      Reflex Scores:       Tricep reflexes are 2+ on the right side and 3+ on the left side.       Bicep reflexes are 2+ on the right side and 3+ on the left side.       Brachioradialis reflexes are 2+ on the right side and 3+ on the left side.       Patellar reflexes are 2+ on the right side and 3+ on the left side.       Achilles reflexes are 2+ on the right side and 2+ on the left side.     Comments: Left spastic hemiplegia   Psychiatric:         Attention and Perception: Attention and perception normal.         Mood and Affect: Mood and affect normal. Mood is not depressed.         Speech: Speech normal.         Behavior: Behavior normal. Behavior is cooperative.         Thought Content: Thought content normal.         Cognition and Memory: Cognition and memory normal.         Judgment: Judgment normal. Judgment is not impulsive or  inappropriate.           Impression:   Diagnosis Orders   1. Chronic migraine without aura, intractable, without status migrainosus     2. Spastic hemiplegia of left nondominant side as late effect of cerebral infarction (HCC)          Plan:   1.  Since patient received her last Botox injection back in February she is overdue for a new set of injections.  2.  We are going to schedule patient for Botox injections in a week.  3.  In the meantime, we are going to obtain prior authorization from the insurance company for Botox injections.  Patient will require 600 units 200 units for the scalp and posterior cervical muscles and 400 units for the left upper extremity.  Thank you           Windell Norfolk, MD on 10/01/19

## 2019-10-02 MED ORDER — ONABOTULINUMTOXINA 200 UNITS IJ SOLR
200 units | Freq: Once | INTRAMUSCULAR | Status: DC
Start: 2019-10-02 — End: 2019-10-01

## 2019-10-07 ENCOUNTER — Ambulatory Visit: Admit: 2019-10-07 | Discharge: 2019-10-07 | Payer: MEDICARE | Attending: Neurology | Primary: Internal Medicine

## 2019-10-07 DIAGNOSIS — G43719 Chronic migraine without aura, intractable, without status migrainosus: Secondary | ICD-10-CM

## 2019-10-07 MED ORDER — ONABOTULINUMTOXINA 100 UNITS IJ SOLR
100 units | Freq: Once | INTRAMUSCULAR | Status: AC
Start: 2019-10-07 — End: 2019-10-07
  Administered 2019-10-07: 17:00:00 600 [IU] via INTRAMUSCULAR

## 2019-10-07 MED ORDER — BOTOX 200 UNITS IJ SOLR
200 units | INTRAMUSCULAR | 3 refills | Status: DC
Start: 2019-10-07 — End: 2020-12-15

## 2019-10-07 NOTE — Telephone Encounter (Signed)
Medication:  Botox 200 (600 units total)  Dosing Schedule:    Prior Authorization: Approved (prescription benefit)  PA reference #: [id number]  Approval dates: 10/07/2019 - 01/07/2020    PA Approved for Botox under Prescription benefit. We also got pt signed up for the summa grant for assistance with the copay.  botox is supplied by [facility] specialty pharmacy.    The Botox will be delivered to MD office on Monday 10/14/2019   PLEASE REPLACE THE BUY AND BILL INVENTORY THAT WAS USED FOR TODAYS APT!!!      BOTOX IS BUY AND BILL!!!

## 2019-10-07 NOTE — Progress Notes (Signed)
SUBJECTIVE    Connie Lyons is a 49 year old Female who was [facility] to [facility] for clinical management services for Botox 200 UNIT.      Diagnosis Hemiplegia and hemiparesis following cerebral infarction affecting left non-dominant side I69.354    OBJECTIVE    Medications:  Botox 200 UNIT SOLR IJ  Diclofenac Potassium 50 MG TABS PO      Allergies:  Cymbalta  Mapap (acetaminophen)  Dilaudid  Zofran  fish derived      Medical History & Comorbidities:    Problem list has been reviewed in the EHR    DC Botox 10/07/2019  Have your symptoms changed?  Assessment Date: 10/07/2019  Value: Better    On a scale of 0-4, how often are you burdened by your symptoms?  Assessment Date: 10/07/2019  Value: 3: multiple times a day    Is the medication helping your symptoms?  Assessment Date: 10/07/2019  Value: 3: most of the time    ASSESSMENT / PLAN    Botox 200 UNIT    Contact    Advised when to contact pharmacy or provider. Provided with direct number to clinical pharmacist for questions or concerns prior to scheduled follow up. The patient was oriented to the pharmacy???s services upon their initial fill and it was communicated that they can participate in this plan of care by speaking with a Pharmacist which is offered during each reassessment.    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    Side effects    Educated that while botulinum toxin is generally well tolerated, side effects could include pain and bruising at injection site, neck pain, dry mouth, and temporary difficulty moving parts of the face. The patient should contact their provider if they develop potential serious side effects such as signs of allergic reaction, difficulty breathing, or changes in the senses.    Administration    Botulinum toxin will always be professionally administered in the prescribing provider???s office. The medication must be carefully injected into specific  muscles in the head/neck region, and the procedure typically takes about 15 minutes to complete.        Medication is provider administered and patient is comfortable with this treatment having received for  several years.  Patient is now signed up for [employer] and has no barriers to care and [facility] will be managing all her medications.    Storage/Disposal    If picked up from a pharmacy, the patient should keep botulinum toxin in its original packaging and refrigerated. Otherwise, the clinic will have the patients dose ready for their appointment.    Disease state education    Counseled on general dystonia management strategies along with emphasizing that the therapy(s) is a chronic treatment to be administered as long as it is safe, effective, and necessary for pain and spasm relief.    Monitoring and follow up    The patient will be routinely monitored through electronic and in-person methods for the spasticity response to the therapy. It is essential that the patient remain in contact with the pharmacy and provider to optimize their safety and care.    Expectations and Goals of therapy    Botulinum toxin is intended to provide relief to the patient from dystonia pain due to the patient???s diagnosis. Through its mechanism, botulinum toxin relieves dystonia through muscle relaxation. The full effects of treatment can be expected within 3-4 days of the patient???s  appt and should last about 3-4 weeks.    Pharmacy Recommendations/Education/Other    Patient was finally able to receive Botox for the first time in 6 months and will be followed by [facility] to ensure proper adherence and financial help.

## 2019-10-07 NOTE — Telephone Encounter (Signed)
As discussed in office Botox 600 units for [place] is pended below.    Thanks!

## 2019-10-07 NOTE — Progress Notes (Signed)
Administrations This Visit     onabotulinumtoxin A (BOTOX) injection 600 Units     Admin Date  10/07/2019  13:25 Action  Given Dose  600 Units Route  Intramuscular Site  Other Administered By  Rushie Nyhan, RN    Ordering Provider: Emily Filbert, APRN - CNP    NDC: [phone]    Lot#: [id number]    Manufacturer: Blase Mess    Patient Supplied?: Yes    Comments: See notes for administration sites.            '

## 2019-10-07 NOTE — Telephone Encounter (Signed)
Requested Prescriptions     Signed Prescriptions Disp Refills   ??? Onabotulinumtoxin A (BOTOX) 200 units injection 600 Units 3     Sig: Inject 600 units into the muscle of head, neck and arm q 3 months ICD10 -  K12.244     Authorizing Provider: Emily Filbert

## 2019-10-08 NOTE — Progress Notes (Signed)
DEPARTMENT OF NEUROLOGY    BOTOX PROCEDURE NOTE FOR HEADACHE AND                                           SPASTICITY    Date: 10/07/19     Patient: Quinlyn Tep      DOB: 26-Jul-1970     Patient is a return visit to the neurology clinic.  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  Today patient presented to the neurology clinic to resume her treatment with Botox injections.  For the last 10 years patient had received treatment for chronic migraine with  Botox in the scalp and posterior cervical muscles.  In addition patient also received injections in the left upper extremity to relieve her spasticity.  Until now patient has been receiving Botox injections at the [facility].  Now patient is transferring care here at [facility]  neurology clinic.    Diagnosis: Chronic migraine without aura, intractable, without status migrainosus [G43.719]     Procedure: Botox injections into the scalp and posterior cervical muscles    Prior to procedure risks, benefits, alternatives, and potential side effects were reviewed with patient. Specifically reviewed possible risk of temporary muscle weakness. All questions were answered, patient expressed understanding, verbal consent given for procedure.  No numbing spray applied/removed.     Botox was injected with the parameters below:     With the patient in sitting position, she received Botox injections in the neck and skull muscles. Patient receive the following doses:         1. R Frontalis 10 units       2. L Frontalis  10 units       3. R Corrugator 10 units       4. L Corrugator 10 units       5. R Temporalis 30 units       6. L Temporalis 30  units       7. R Occipitalis  30 units       8. L Occipitalis  30 units       9. R Trapezious 20 units       10.L Trapezious 20 units          Total units injected 200 units. Units discarded 0 units.           Procedure #2: Botox injections into  the left upper extremity.    Diagnosis: Left spastic hemiparesis.    With the patient in supine position patient received Botox injections into the left upper extremity.  All the injection were performed under EMG monitoring.  Patient received the following doses.    1.  Left pectoralis 50 units at 2 injection sites.  2.  Left biceps 75 units.  3.  Left flexor carpi ulnaris 50 units  4.  Left flexor therapy radialis 50 units  5.  Left flexor digitorum profundus 50 units  6.  Left flexor digitorum sublimis 50 units.  7.  Left flexor pollicis longus 25 units  8.  Left flexor pollicis brevis 25 units.  9.  Left flexor digitorum brevis 25 units.    Total doses administered was 400 units, units discarded were 0.  No complication were observed after the procedure.  Patient will return to the neurology clinic in 6 weeks  for further evaluation thank you.        Windell Norfolk, MD

## 2019-10-11 NOTE — Telephone Encounter (Signed)
OV note was received, but not the vascular clearance. Faxed Clearance again to Darlene's ATTN. Again was told she promises to have this taken care of on Monday.

## 2019-10-14 NOTE — Telephone Encounter (Signed)
Patient's husband contacted the staff wanting to start the surgery scheduling process. He states that the vascular doctor faxed over the clearance note as well as gave verbal orders

## 2019-10-17 NOTE — Telephone Encounter (Signed)
Notified patient that I have attempted to contact the office and re-faxed per last note.

## 2019-10-18 NOTE — Telephone Encounter (Signed)
Message released to patient as written.     Patient's further questions if applicable:     Were all questions from office addressed or relayed to the patient from encounter ? Yes    The patient husband is calling to states the vascular doctor has given verbal permission and they also faxed over the surgery clearance form on  10-09-19.    Please advise        Call back time is between 1:00 pm to 2: pm

## 2019-10-30 NOTE — Telephone Encounter (Signed)
Patient rescheduled for 10/12 at [facility] at 930a, PAT will be scheduled week prior at [facility] any day around 10 or 11.

## 2019-10-30 NOTE — Telephone Encounter (Signed)
FYI patient scheduled for 10/12 for surgery. Left Tenotomy percutaneous 2nd, 3rd,4th and 5th.        Spoke with Kriste Basque, RN for Dr. Pennie Rushing, stating that she states in the office note that she was cleared, but she will need  VTE prophylaxis with Xarelto 10mg /days if to be NWB in cast/boot. Do not stop aspirin. CAUTION given possible connective tissue disorder.     Clearance will be faxed back shortly to be scanned in media.

## 2019-11-06 MED ORDER — NURTEC 75 MG PO TBDP
75 | ORAL_TABLET | ORAL | 5 refills | Status: DC | PRN
Start: 2019-11-06 — End: 2020-05-08

## 2019-11-06 NOTE — Telephone Encounter (Signed)
As discussed, here is the pended order for [facility] Nurtec.    Thanks!

## 2019-11-06 NOTE — Telephone Encounter (Signed)
Requested Prescriptions     Signed Prescriptions Disp Refills   ??? Rimegepant Sulfate (NURTEC) 75 MG TBDP 16 tablet 5     Sig: Take 1 tablet by mouth every 48 hours as needed (migraine)     Authorizing Provider: Tiare Rohlman

## 2019-11-26 ENCOUNTER — Inpatient Hospital Stay: Admit: 2019-11-26 | Payer: MEDICARE | Attending: Foot and Ankle Surgery | Primary: Internal Medicine

## 2019-11-26 LAB — CBC
Hematocrit: 37.5 % (ref 35.0–47.0)
Hemoglobin: 12.6 g/dL (ref 11.7–16.0)
MCH: 30.8 pg (ref 26.0–34.0)
MCHC: 33.6 % (ref 32.0–36.0)
MCV: 91.7 fL (ref 79.0–98.0)
MPV: 9.2 fL (ref 7.4–10.4)
Platelets: 267 10*3/uL (ref 140–440)
RBC: 4.09 10*6/uL (ref 3.80–5.20)
RDW: 13.2 % (ref 11.5–14.5)
WBC: 7 10*3/uL (ref 3.6–10.7)

## 2019-11-26 LAB — BASIC METABOLIC PANEL
Anion Gap: 5 mmol/L (ref 3–13)
BUN: 10 mg/dL (ref 9–20)
CO2: 28 mmol/L (ref 22–30)
Calcium: 9.3 mg/dL (ref 8.4–10.4)
Chloride: 102 mmol/L (ref 98–107)
Creatinine: 0.73 mg/dL (ref 0.52–1.25)
EGFR IF NonAfrican American: >90 mL/min (ref >60)
Glucose: 109 mg/dL — ABNORMAL HIGH (ref 70–100)
Potassium: 4 mmol/L (ref 3.5–5.1)
Sodium: 136 mmol/L (ref 135–145)
eGFR African American: >90 mL/min (ref >60)

## 2019-11-26 NOTE — H&P (View-Only) (Signed)
Comprehensive PreSurgical History and Physical      Name: Connie Lyons MRN: [id number] DOB: [birth year]    (Age-49 y.o.)     Date of Service: Pt seen/examined on 11/26/2019     Chief Complaint: LEFT ANKLE PAIN DUE TO INJURY AS SHE FELL IN January AND WENT TO [facility]   ??  History Of Present Illness:    49 y.o. female who we are asked to see/evaluate by Connie Fowler, MD for pre-operative evaluation prior to Procedure Notes:  LEFT TENOTOMY PERCUTANEOUS 2ND, 3RD, 4TH AND 5TH TOES  GENERAL ANESTHESIA.  On 12/03/19  AND PATIENT IS HERE IN PAT ALONG WITH HER  HUSBAND  AND AS PER ORTHO  NOTES   Connie Lyons??is a 49 y.o.??female??who is here today for evaluation of her??Left??ankle  ??Connie Lyons??has a chief complaint of afracture  ??Connie Lyons??reports the current issuestarted approximately 5??day(s)??ago. ????She fell and injured the left ankle. ??She was seen in the emergency department and placed in a boot. ??She has been weightbearing on the left ankle since the injury. ??She feels that she often has issues with her foot and ankle because her toes curl. ??She has a neuromuscular disorder and because of the curled toes has difficulty bearing weight at times. ??She asked if there are any treatments for the toes. ??She has had Botox injections in the past that temporarily correct the problem.  ??Current symptoms:??pain, swelling and discoloration  ??Connie Lyons??has seen another medical professional for this problem: Yes  ??Connie Lyons??has tried or been treated with thefollowing: NSAIDs or other OTC medications, a brace/splint/boot and walking aids (cane,crutches,walker,etc)  ??  Past Medical History:        Diagnosis Date   ??? Acquired deformities of toe, left     SCHEDULED  FOR THE SURGERY ON 07/30/2019   ??? Asthma    ??? Cerebral artery occlusion with cerebral infarction (HCC)     2012  LEFT  SIDED WEAKNESS   ??? Former smoker    ??? H/O cardiovascular stress test    ??? Hx of blood clots     LEFT CALF - 2012    ??? Hyperlipidemia    ??? PONV (postoperative  nausea and vomiting)     NAUSEA       Past Surgical History:        Procedure Laterality Date   ??? APPENDECTOMY     ??? BREAST SURGERY      IMPLANTS   ??? COLONOSCOPY     ??? ENDOSCOPY, COLON, DIAGNOSTIC     ??? HYSTERECTOMY     ??? WISDOM TOOTH EXTRACTION         Medications Prior to Admission:    Prior to Admission medications    Medication Sig Start Date End Date Taking? Authorizing Provider   Rimegepant Sulfate (NURTEC) 75 MG TBDP Take 1 tablet by mouth every 48 hours as needed (migraine) 11/06/19  Yes Emily Filbert, APRN - CNP   Onabotulinumtoxin A (BOTOX) 200 units injection Inject 600 units into the muscle of head, neck and arm q 3 months ICD10 -  I69.354 10/07/19  Yes Emily Filbert, APRN - CNP   butalbital-acetaminophen-caffeine (FIORICET, ESGIC) 50-325-40 MG per tablet Take by mouth   Yes Historical Provider, MD   amitriptyline (ELAVIL) 25 MG tablet TAKE 1 TABLET BY MOUTH DAILY AT BEDTIME 08/24/19  Yes Historical Provider, MD   magnesium 30 MG tablet Take 250 mg by mouth nightly   Yes Historical Provider, MD   atorvastatin (LIPITOR) 20 MG tablet Take 20 mg by  mouth nightly   Yes Historical Provider, MD   albuterol sulfate HFA (VENTOLIN HFA) 108 (90 Base) MCG/ACT inhaler Inhale 2 puffs into the lungs every 6 hours as needed for Wheezing   Yes Historical Provider, MD   omeprazole (PRILOSEC) 20 MG delayed release capsule Take 20 mg by mouth every morning (before breakfast)   Yes Historical Provider, MD   venlafaxine (EFFEXOR XR) 150 MG extended release capsule Take 150 mg by mouth every morning (before breakfast)   Yes Historical Provider, MD   aspirin 81 MG chewable tablet Take 81 mg by mouth every morning (before breakfast)   Yes Historical Provider, MD   cyclobenzaprine (FLEXERIL) 10 MG tablet Take 10 mg by mouth nightly   Yes Historical Provider, MD   dicyclomine (BENTYL) 20 MG tablet Take 20 mg by mouth every 4 hours as needed   Yes Historical Provider, MD   metoprolol succinate (TOPROL XL) 25 MG extended release  tablet Take 25 mg by mouth nightly    Historical Provider, MD       ANTICOAGULATION: No  CHRONIC STEROID USE:  No  CHRONIC NARCOTIC USE: No    Allergies:  Cymbalta [duloxetine hcl], Dilaudid [hydromorphone hcl], Fish-derived products, Tylenol [acetaminophen], and Zofran [ondansetron hcl]    If patient has opioid allergy, is it okay to take Acetaminophen: Yes      Social History:   TOBACCO:   reports that she has quit smoking. She has never used smokeless tobacco.  ETOH:   reports no history of alcohol use.  Social History     Substance and Sexual Activity   Drug Use Yes   ??? Types: Marijuana    Comment: 1 OR 2 TIMES PER WEEK        Family History:  History reviewed. No pertinent family history.    REVIEW OF SYSTEMS:   Pertinent positives as noted in the HPI. All other systems reviewed and negative except for .ACQUIRED  DEFORMITY OF LEFT  FOOT  TOES    PHYSICAL EXAM:  Vitals:  BP (!) 97/57    Pulse 89    Temp 97.5 ??F (36.4 ??C) (Temporal)    Resp 16    Ht 5\' 1"  (1.549 m)    Wt 183 lb (83 kg)    SpO2 98%    BMI 34.58 kg/m??   BMI Classification:  Obese (BMI 30.0-39.9)  General appearance: No apparent distress, appears stated age and cooperative.  HEENT: Normal cephalic, atraumatic without obvious deformity. Pupils equal, round, and reactive to light.  Extra ocular muscles intact. Conjunctivae/corneas clear.  Neck: No jugular venous distention. Trachea midline.  Respiratory:  Normal respiratory effort. Clear to auscultation, bilaterally without Rales/Wheezes/Rhonchi.  Cardiovascular: Regular rate and rhythm with normal S1/S2 without murmurs, rubs or gallops.  Abdomen: Soft, non-tender, non-distended with normal bowel sounds.  Musculoskeletal: No clubbing, cyanosis or edema bilaterally. Full ROM of all extremities.   Skin: Skin color, texture, turgor normal.  No rashes or lesions.  Neurologic:  Neurovascularly intact without any focal sensory/motor deficits. Cranial nerves: II-XII intact, grossly non-focal.  Psychiatric:  Alert and oriented, thought content appropriate, normal insight    Labs:   Lab Results   Component Value Date    WBC 6.9 07/23/2019    HGB 12.9 07/23/2019    HCT 38.1 07/23/2019    MCV 93.0 07/23/2019    PLT 260 07/23/2019     Lab Results   Component Value Date    NA 136 07/23/2019    K  3.9 07/23/2019    CL 105 07/23/2019    CO2 24 07/23/2019    BUN 11 07/23/2019    CREATININE 0.67 07/23/2019    GLUCOSE 93 07/23/2019    CALCIUM 9.2 07/23/2019       Lee's Simple Cardiac Risk Index:  High-risk surgery (intraperitoneal, intrathoracic or suprainguinal vascular surgery): yes - 1 Point  Coronary artery disease: no  Congestive heart failure: no  History of cerebrovascular disease: yes - 1 Point  Insulin treatment for diabetes mellitus: no  Preoperative serum creatinine > 2.0mg/dL: no    Total:   Interpretation:  0 Points Class I   1 Point Class II   2 Points Class III   >=3 Points Class IV        OSA Screening (STOP-Bang)  NO NOT COMPLETE IF ALREADY DIAGNOSED WITH OSA  Do you snore loudly (loud enough to be heard through closed doors, or your bed partner elbows you for snoring at night)?yes - 1 Point    Do you often feel tired, fatigued, or sleepy during the daytime (such as falling asleep during driving)?no    Has anyone observed you stop breathing or choking/gasping during your sleep?no    Do you have or are being treated for high blood pressure? no    Body mass index more than 35 kg/m2?  no    Age older than 50 years old? no    Neck size large? (measured around Adam's apple)  For female, is your shirt collar 17 inches or larger?  no  For female, is your shirt collar 16 inches or larger?no    Gender = Female?  No     STOP BANG SCORE = 1    Information from PAT significant to anesthetic history as recorded by Clark Clowdus PAUL Maimuna Leaman, PA  on 11/26/2019        Anesthesia Component:    Any problems with anesthesia?: Past General Anesthetic with mild PONV  History of difficult intubation?: None reported  History of difficult IV access?:  None reported  Family History of problems with anesthesia?: None noted   Risk Factors PONV:    Female: Note:Tarini Mandato is female  Motion sick or prior PONV: Yes (+1)  NonSmoker: FORMER  SMOKER   Opiods for Procedure: Yes (+1)            Airway and Dentition Patient PAT Education - Risk Assesment      ASA Score:ASA 2 - Mild lung disease  Mallampati  PAT assesment: 2        TMD: >4cm  Mouth Size: WNL  Neck: WNL  Dentures: None  Partials: None  Overall Dentition: Missing teeth top and bottom,      GERD:Controlled      Frailty Risk Assessment: FRAILTY RISK IF >64 years old - Pt is 48 y.o.       Self reported exhaustion (+1)Low physical activity (+1)  Slow walking (+1)No Risk Factors unless listed   Patient received procedure specific education prior to surgery per nursing     Tobacco:FORMER SMOKER     ETOH:  reports no history of alcohol use.  Drug:   Social History     Substance and Sexual Activity   Drug Use Yes   ??? Types: Marijuana    Comment: 1 OR 2 TIMES PER WEEK        Functional Capacity: (>4 METS implies low cardiovascular risk from surgery):  Do light work around the house, such as dusting or washing dishes (  2.70 METs), Take care of self, that is eating, dressing, bathing, using the toilet (2.75 METs) and USE CANE  TO AMBULATE     PAT Pain Score:6 LEFT ANKLE AND FOOT     Postop Pain Management Plan (Pain consult ordered?): NONE              EKG:    DONE ON 07/23/19  SINUS RHYTHM  Electronically Signed On 07-24-2019 8:26:09 EDT by Burnett Harry  ECHO and NO:MVEH on file   No results found for: LVEF, LVEFMODE    ASSESSMENT/PLAN:  Patient is considered intermediate  risk for this intermediate risk procedure/surgery (Procedure Notes:  LEFT TENOTOMY PERCUTANEOUS 2ND, 3RD, 4TH AND 5TH TOES  GENERAL ANESTHESIA) with no reducible risk factors. Based on the above evaluation, the benefits of the planned procedure likely exceed the risks.  The patient is medically optimized to proceed with the planned procedure without  any further cardiopulmonary testing.  ??  1)  ACQUIRED  DEFORMITY OF LEFT TOES ; SCHEDULED FOR THE SURGERY   ??         2)  H/O CVA ; LEFT  SIDED WEAKNESS  ;F/U NEUROLOGIST  HAD BOTOX INJECTION          3)  ASTHMA ; ON VENTOLIN        4)  H/O TACHYCARDIA/PALPIRTATIONS; ON METOPROLOL   5) HYPERLIPIDEMIA ; ON LIPITOR   6)  H/OPONV   7) IBS ; ON BENTYL                                                                                                                       8) ANXIETY/DEPRESSION ;ON EFFEXOR     9) MIGRAINE HEADACHE   ; ON NURTEC                                                  10 ) MARIJUANA USER                                                                                 11) FORMER  SMOKER   Labs Ordered:  YES - PER PAT PROTOCOL  COVID-19 TESTING ORDERED: []   ECG Ordered:  NO - COMPLETE PRIOR TO PAT VISIT  Sleep Referral Ordered:  NO - NEGATIVE SCREEN PER SLEEP REFERRAL PROTOCOL          Electronically signed by: Baya Lentz , PA     Date: 11/26/2019 at 10:40 AM  PAT Protocol referenced includes:  Anesthesia Lab Protocol Orders  Perioperative Cardiovascular Risk Assessment  Anesthesia Assessment   Pain Assessment and Acute Pain Service Consult (if appropriate)  Medical Clearance/Consult from Internal Medicine ([place])  Shower/Wash Order (for designated surgeries)  OSA Screen and Sleep Clinic Referral (if appropriate)

## 2019-11-26 NOTE — H&P (Signed)
Comprehensive PreSurgical History and Physical      Name: Connie Lyons MRN: [id number] DOB: [birth year]    (Age-49 y.o.)     Date of Service: Pt seen/examined on 11/26/2019     Chief Complaint: LEFT ANKLE PAIN DUE TO INJURY AS SHE FELL IN January AND WENT TO [facility]   ??  History Of Present Illness:    49 y.o. female who we are asked to see/evaluate by Moreen Fowler, MD for pre-operative evaluation prior to Procedure Notes:  LEFT TENOTOMY PERCUTANEOUS 2ND, 3RD, 4TH AND 5TH TOES  GENERAL ANESTHESIA.  On 12/03/19  AND PATIENT IS HERE IN PAT ALONG WITH HER  HUSBAND  AND AS PER ORTHO  NOTES   Connie Lyons??is a 49 y.o.??female??who is here today for evaluation of her??Left??ankle  ??Connie Lyons??has a chief complaint of afracture  ??Connie Lyons??reports the current issuestarted approximately 5??day(s)??ago. ????She fell and injured the left ankle. ??She was seen in the emergency department and placed in a boot. ??She has been weightbearing on the left ankle since the injury. ??She feels that she often has issues with her foot and ankle because her toes curl. ??She has a neuromuscular disorder and because of the curled toes has difficulty bearing weight at times. ??She asked if there are any treatments for the toes. ??She has had Botox injections in the past that temporarily correct the problem.  ??Current symptoms:??pain, swelling and discoloration  ??Connie Lyons??has seen another medical professional for this problem: Yes  ??Connie Lyons??has tried or been treated with thefollowing: NSAIDs or other OTC medications, a brace/splint/boot and walking aids (cane,crutches,walker,etc)  ??  Past Medical History:        Diagnosis Date   ??? Acquired deformities of toe, left     SCHEDULED  FOR THE SURGERY ON 07/30/2019   ??? Asthma    ??? Cerebral artery occlusion with cerebral infarction (HCC)     2012  LEFT  SIDED WEAKNESS   ??? Former smoker    ??? H/O cardiovascular stress test    ??? Hx of blood clots     LEFT CALF - 2012    ??? Hyperlipidemia    ??? PONV (postoperative  nausea and vomiting)     NAUSEA       Past Surgical History:        Procedure Laterality Date   ??? APPENDECTOMY     ??? BREAST SURGERY      IMPLANTS   ??? COLONOSCOPY     ??? ENDOSCOPY, COLON, DIAGNOSTIC     ??? HYSTERECTOMY     ??? WISDOM TOOTH EXTRACTION         Medications Prior to Admission:    Prior to Admission medications    Medication Sig Start Date End Date Taking? Authorizing Provider   Rimegepant Sulfate (NURTEC) 75 MG TBDP Take 1 tablet by mouth every 48 hours as needed (migraine) 11/06/19  Yes Emily Filbert, APRN - CNP   Onabotulinumtoxin A (BOTOX) 200 units injection Inject 600 units into the muscle of head, neck and arm q 3 months ICD10 -  I69.354 10/07/19  Yes Emily Filbert, APRN - CNP   butalbital-acetaminophen-caffeine (FIORICET, ESGIC) 50-325-40 MG per tablet Take by mouth   Yes Historical Provider, MD   amitriptyline (ELAVIL) 25 MG tablet TAKE 1 TABLET BY MOUTH DAILY AT BEDTIME 08/24/19  Yes Historical Provider, MD   magnesium 30 MG tablet Take 250 mg by mouth nightly   Yes Historical Provider, MD   atorvastatin (LIPITOR) 20 MG tablet Take 20 mg by  mouth nightly   Yes Historical Provider, MD   albuterol sulfate HFA (VENTOLIN HFA) 108 (90 Base) MCG/ACT inhaler Inhale 2 puffs into the lungs every 6 hours as needed for Wheezing   Yes Historical Provider, MD   omeprazole (PRILOSEC) 20 MG delayed release capsule Take 20 mg by mouth every morning (before breakfast)   Yes Historical Provider, MD   venlafaxine (EFFEXOR XR) 150 MG extended release capsule Take 150 mg by mouth every morning (before breakfast)   Yes Historical Provider, MD   aspirin 81 MG chewable tablet Take 81 mg by mouth every morning (before breakfast)   Yes Historical Provider, MD   cyclobenzaprine (FLEXERIL) 10 MG tablet Take 10 mg by mouth nightly   Yes Historical Provider, MD   dicyclomine (BENTYL) 20 MG tablet Take 20 mg by mouth every 4 hours as needed   Yes Historical Provider, MD   metoprolol succinate (TOPROL XL) 25 MG extended release  tablet Take 25 mg by mouth nightly    Historical Provider, MD       ANTICOAGULATION: No  CHRONIC STEROID USE:  No  CHRONIC NARCOTIC USE: No    Allergies:  Cymbalta [duloxetine hcl], Dilaudid [hydromorphone hcl], Fish-derived products, Tylenol [acetaminophen], and Zofran [ondansetron hcl]    If patient has opioid allergy, is it okay to take Acetaminophen: Yes      Social History:   TOBACCO:   reports that she has quit smoking. She has never used smokeless tobacco.  ETOH:   reports no history of alcohol use.  Social History     Substance and Sexual Activity   Drug Use Yes   ??? Types: Marijuana    Comment: 1 OR 2 TIMES PER WEEK        Family History:  History reviewed. No pertinent family history.    REVIEW OF SYSTEMS:   Pertinent positives as noted in the HPI. All other systems reviewed and negative except for .ACQUIRED  DEFORMITY OF LEFT  FOOT  TOES    PHYSICAL EXAM:  Vitals:  BP (!) 97/57    Pulse 89    Temp 97.5 ??F (36.4 ??C) (Temporal)    Resp 16    Ht 5\' 1"  (1.549 m)    Wt 183 lb (83 kg)    SpO2 98%    BMI 34.58 kg/m??   BMI Classification:  Obese (BMI 30.0-39.9)  General appearance: No apparent distress, appears stated age and cooperative.  HEENT: Normal cephalic, atraumatic without obvious deformity. Pupils equal, round, and reactive to light.  Extra ocular muscles intact. Conjunctivae/corneas clear.  Neck: No jugular venous distention. Trachea midline.  Respiratory:  Normal respiratory effort. Clear to auscultation, bilaterally without Rales/Wheezes/Rhonchi.  Cardiovascular: Regular rate and rhythm with normal S1/S2 without murmurs, rubs or gallops.  Abdomen: Soft, non-tender, non-distended with normal bowel sounds.  Musculoskeletal: No clubbing, cyanosis or edema bilaterally. Full ROM of all extremities.   Skin: Skin color, texture, turgor normal.  No rashes or lesions.  Neurologic:  Neurovascularly intact without any focal sensory/motor deficits. Cranial nerves: II-XII intact, grossly non-focal.  Psychiatric:  Alert and oriented, thought content appropriate, normal insight    Labs:   Lab Results   Component Value Date    WBC 6.9 07/23/2019    HGB 12.9 07/23/2019    HCT 38.1 07/23/2019    MCV 93.0 07/23/2019    PLT 260 07/23/2019     Lab Results   Component Value Date    NA 136 07/23/2019    K  3.9 07/23/2019    CL 105 07/23/2019    CO2 24 07/23/2019    BUN 11 07/23/2019    CREATININE 0.67 07/23/2019    GLUCOSE 93 07/23/2019    CALCIUM 9.2 07/23/2019       Lee's Simple Cardiac Risk Index:  High-risk surgery (intraperitoneal, intrathoracic or suprainguinal vascular surgery): yes - 1 Point  Coronary artery disease: no  Congestive heart failure: no  History of cerebrovascular disease: yes - 1 Point  Insulin treatment for diabetes mellitus: no  Preoperative serum creatinine > 2.0mg /dL: no    Total:   Interpretation:  0 Points Class I   1 Point Class II   2 Points Class III   >=3 Points Class IV        OSA Screening (STOP-Bang)  NO NOT COMPLETE IF ALREADY DIAGNOSED WITH OSA  Do you snore loudly (loud enough to be heard through closed doors, or your bed partner elbows you for snoring at night)?yes - 1 Point    Do you often feel tired, fatigued, or sleepy during the daytime (such as falling asleep during driving)?no    Has anyone observed you stop breathing or choking/gasping during your sleep?no    Do you have or are being treated for high blood pressure? no    Body mass index more than 35 kg/m2?  no    Age older than 50 years old? no    Neck size large? (measured around Adam's apple)  For female, is your shirt collar 17 inches or larger?  no  For female, is your shirt collar 16 inches or larger?no    Gender = Female?  No     STOP BANG SCORE = 1    Information from PAT significant to anesthetic history as recorded by Connie Vanderhoof Genia PlantsPAUL Jillayne Witte, Connie Lyons  on 11/26/2019        Anesthesia Component:    Any problems with anesthesia?: Past General Anesthetic with mild PONV  History of difficult intubation?: None reported  History of difficult IV access?:  None reported  Family History of problems with anesthesia?: None noted   Risk Factors PONV:    Female: Note:Connie Lyons is female  Motion sick or prior PONV: Yes (+1)  NonSmoker: FORMER  SMOKER   Opiods for Procedure: Yes (+1)            Airway and Dentition Patient PAT Education - Risk Assesment      ASA Score:ASA 2 - Mild lung disease  Mallampati  PAT assesment: 2        TMD: >4cm  Mouth Size: WNL  Neck: WNL  Dentures: None  Partials: None  Overall Dentition: Missing teeth top and bottom,      GERD:Controlled      Frailty Risk Assessment: FRAILTY RISK IF >50 years old - Pt is [age] y.o.       Self reported exhaustion (+1)Low physical activity (+1)  Slow walking (+1)No Risk Factors unless listed   Patient received procedure specific education prior to surgery per nursing     Tobacco:FORMER SMOKER     ETOH:  reports no history of alcohol use.  Drug:   Social History     Substance and Sexual Activity   Drug Use Yes   ??? Types: Marijuana    Comment: 1 OR 2 TIMES PER WEEK        Functional Capacity: (>4 METS implies low cardiovascular risk from surgery):  Do light work around the house, such as dusting or washing dishes (  2.70 METs), Take care of self, that is eating, dressing, bathing, using the toilet (2.75 METs) and USE CANE  TO AMBULATE     PAT Pain Score:6 LEFT ANKLE AND FOOT     Postop Pain Management Plan (Pain consult ordered?): NONE              EKG:    DONE ON 07/23/19  SINUS RHYTHM  Electronically Signed On 07-24-2019 8:26:09 EDT by Burnett Harry  ECHO and NO:MVEH on file   No results found for: LVEF, LVEFMODE    ASSESSMENT/PLAN:  Patient is considered intermediate  risk for this intermediate risk procedure/surgery (Procedure Notes:  LEFT TENOTOMY PERCUTANEOUS 2ND, 3RD, 4TH AND 5TH TOES  GENERAL ANESTHESIA) with no reducible risk factors. Based on the above evaluation, the benefits of the planned procedure likely exceed the risks.  The patient is medically optimized to proceed with the planned procedure without  any further cardiopulmonary testing.  ??  1)  ACQUIRED  DEFORMITY OF LEFT TOES ; SCHEDULED FOR THE SURGERY   ??         2)  H/O CVA ; LEFT  SIDED WEAKNESS  ;F/U NEUROLOGIST  HAD BOTOX INJECTION          3)  ASTHMA ; ON VENTOLIN        4)  H/O TACHYCARDIA/PALPIRTATIONS; ON METOPROLOL   5) HYPERLIPIDEMIA ; ON LIPITOR   6)  H/OPONV   7) IBS ; ON BENTYL                                                                                                                       8) ANXIETY/DEPRESSION ;ON EFFEXOR     9) MIGRAINE HEADACHE   ; ON NURTEC                                                  10 ) MARIJUANA USER                                                                                 11) FORMER  SMOKER   Labs Ordered:  YES - PER PAT PROTOCOL  COVID-19 TESTING ORDERED: []   ECG Ordered:  NO - COMPLETE PRIOR TO PAT VISIT  Sleep Referral Ordered:  NO - NEGATIVE SCREEN PER SLEEP REFERRAL PROTOCOL          Electronically signed by: Baya Lentz , Connie Lyons     Date: 11/26/2019 at 10:40 AM  PAT Protocol referenced includes:  Anesthesia Lab Protocol Orders  Perioperative Cardiovascular Risk Assessment  Anesthesia Assessment   Pain Assessment and Acute Pain Service Consult (if appropriate)  Medical Clearance/Consult from Internal Medicine ([place])  Shower/Wash Order (for designated surgeries)  OSA Screen and Sleep Clinic Referral (if appropriate)

## 2019-11-26 NOTE — Discharge Instructions (Signed)
Take these medications on the day of surgery  ASA  Ventolin  Effexor  prilosec    take following meds at bedtime per your usual routine   Elavil  lipitor  Metoprolol    Stop all vitamins 7 days before surgery    During pre-admission testing appointment, patient instructed on the following   To arrive 2 hours prior to scheduled surgery   Upon arrival,  come to the same day surgery department, stopping at the main desk   They need to have made arrangements for a ride home following surgery and a phone number will need to be provided before going back to the operating room. If public transportation is being used, pt made aware that they need to have a responsible adult accompany them.   They need to make arrangements for someone to stay with them after they get home from surgery.   Leave all jewelry, contacts and valuables at home   Wear loose comfortable clothing to go home in   Which medications need to be held and taken prior to surgery   Bring in the medication list provided for them and write in the date/time last dose was taken   No food (including candy, gum and mints) the day of surgery   They may have clear liquids ( water, black coffee/no liquid or powder creamer, clear tea, clear fruit juices/no pulp and carbonated beverages) up until 2 hours prior to surgery   Do not drink alcohol, use recreational drugs or smoke/use nicotine products 24 hours prior to surgery.   Encouraged to write down any questions they may have for the surgeon, anesthesiologist or any member of the surgical team, and to bring list of questions in with them    During the pre-admission testing appointment, this nurse reviewed and provided patient with    The taking care of yourself after surgery paper   ERAS paper   Smoking Cessation Assistance paper (if a positive smoker)   Pamphlets for Billing information for anesthesia patients, Prescription Opioids and Pharmacy care   Preparing for your surgical procedure  pamphlet   After visit Summary with medication list and medication instructions    Prior to end of PAT appointment, pt acknowledged understanding of information and instructions provided in preparation of upcoming surgery.

## 2019-12-03 ENCOUNTER — Inpatient Hospital Stay: Payer: MEDICARE

## 2019-12-03 MED ORDER — MEPERIDINE HCL 25 MG/ML IJ SOLN
25 MG/ML | INTRAMUSCULAR | Status: DC | PRN
Start: 2019-12-03 — End: 2019-12-03

## 2019-12-03 MED ORDER — PROMETHAZINE HCL 25 MG/ML IJ SOLN
25 MG/ML | Freq: Once | INTRAMUSCULAR | Status: DC | PRN
Start: 2019-12-03 — End: 2019-12-03

## 2019-12-03 MED ORDER — ONDANSETRON HCL 4 MG/2ML IJ SOLN
4 | INTRAMUSCULAR | Status: AC
Start: 2019-12-03 — End: 2019-12-03

## 2019-12-03 MED ORDER — OXYCODONE HCL 5 MG PO TABS
5 MG | ORAL | Status: DC | PRN
Start: 2019-12-03 — End: 2019-12-03

## 2019-12-03 MED ORDER — DIPHENHYDRAMINE HCL 50 MG/ML IJ SOLN
50 MG/ML | Freq: Once | INTRAMUSCULAR | Status: DC | PRN
Start: 2019-12-03 — End: 2019-12-03

## 2019-12-03 MED ORDER — MIDAZOLAM HCL 2 MG/2ML IJ SOLN
2 | INTRAMUSCULAR | Status: AC
Start: 2019-12-03 — End: 2019-12-03

## 2019-12-03 MED ORDER — DEXMEDETOMIDINE HCL 200 MCG/2ML IV SOLN
200 | INTRAVENOUS | Status: AC
Start: 2019-12-03 — End: 2019-12-03

## 2019-12-03 MED ORDER — SODIUM CHLORIDE 0.9 % IV SOLN
0.9 % | INTRAVENOUS | Status: DC | PRN
Start: 2019-12-03 — End: 2019-12-03

## 2019-12-03 MED ORDER — LACTATED RINGERS IV SOLN
INTRAVENOUS | Status: DC
Start: 2019-12-03 — End: 2019-12-03

## 2019-12-03 MED ORDER — SODIUM CHLORIDE 0.9 % IV BOLUS
0.9 % | Freq: Once | INTRAVENOUS | Status: DC | PRN
Start: 2019-12-03 — End: 2019-12-03

## 2019-12-03 MED ORDER — DEXAMETHASONE SOD PHOSPHATE PF 10 MG/ML IJ SOLN
10 | INTRAMUSCULAR | Status: AC
Start: 2019-12-03 — End: 2019-12-03

## 2019-12-03 MED ORDER — BUPIVACAINE-EPINEPHRINE (PF) 0.5% -1:200000 IJ SOLN
INTRAMUSCULAR | Status: AC
Start: 2019-12-03 — End: 2019-12-03

## 2019-12-03 MED ORDER — LIDOCAINE HCL (PF) 1 % IJ SOLN
1 % | Freq: Once | INTRAMUSCULAR | Status: DC | PRN
Start: 2019-12-03 — End: 2019-12-03

## 2019-12-03 MED ORDER — ONDANSETRON HCL 4 MG/2ML IJ SOLN
4 MG/2ML | Freq: Once | INTRAMUSCULAR | Status: DC | PRN
Start: 2019-12-03 — End: 2019-12-03

## 2019-12-03 MED ORDER — APREPITANT 40 MG PO CAPS
40 MG | Freq: Once | ORAL | Status: AC
Start: 2019-12-03 — End: 2019-12-03
  Administered 2019-12-03: 12:00:00 40 mg via ORAL

## 2019-12-03 MED ORDER — LABETALOL HCL 5 MG/ML IV SOLN
5 | INTRAVENOUS | Status: DC | PRN
Start: 2019-12-03 — End: 2019-12-03

## 2019-12-03 MED ORDER — ALPRAZOLAM 0.25 MG PO TBDP
0.25 MG | ORAL | Status: DC | PRN
Start: 2019-12-03 — End: 2019-12-03

## 2019-12-03 MED ORDER — HYDRALAZINE HCL 20 MG/ML IJ SOLN
20 MG/ML | INTRAMUSCULAR | Status: DC | PRN
Start: 2019-12-03 — End: 2019-12-03

## 2019-12-03 MED ORDER — CELECOXIB 400 MG PO CAPS
400 MG | Freq: Once | ORAL | Status: AC
Start: 2019-12-03 — End: 2019-12-03
  Administered 2019-12-03: 11:00:00 400 mg via ORAL

## 2019-12-03 MED ORDER — LIDOCAINE HCL (PF) 2 % IJ SOLN
2 | INTRAMUSCULAR | Status: AC
Start: 2019-12-03 — End: 2019-12-03

## 2019-12-03 MED ORDER — KETAMINE HCL 50 MG/5ML IJ SOSY
50 | INTRAMUSCULAR | Status: AC
Start: 2019-12-03 — End: 2019-12-03

## 2019-12-03 MED ORDER — NORMAL SALINE FLUSH 0.9 % IV SOLN
0.9 % | INTRAVENOUS | Status: DC | PRN
Start: 2019-12-03 — End: 2019-12-03

## 2019-12-03 MED ORDER — OXYCODONE HCL 5 MG PO TABS
5 MG | ORAL_TABLET | Freq: Four times a day (QID) | ORAL | 0 refills | Status: AC | PRN
Start: 2019-12-03 — End: 2019-12-10

## 2019-12-03 MED ORDER — HYDROGEN PEROXIDE 3 % EX SOLN
3 | CUTANEOUS | Status: AC
Start: 2019-12-03 — End: 2019-12-03

## 2019-12-03 MED ORDER — CEFAZOLIN 2000 MG IN D5W 100 ML IVPB
Status: AC
Start: 2019-12-03 — End: 2019-12-03
  Administered 2019-12-03: 12:00:00 2000 mg via INTRAVENOUS

## 2019-12-03 MED ORDER — PROPOFOL 200 MG/20ML IV EMUL
200 | INTRAVENOUS | Status: AC
Start: 2019-12-03 — End: 2019-12-03

## 2019-12-03 MED ORDER — NORMAL SALINE FLUSH 0.9 % IV SOLN
0.9 % | Freq: Two times a day (BID) | INTRAVENOUS | Status: DC
Start: 2019-12-03 — End: 2019-12-03

## 2019-12-03 MED ORDER — FAMOTIDINE 20 MG PO TABS
20 MG | Freq: Once | ORAL | Status: AC
Start: 2019-12-03 — End: 2019-12-03
  Administered 2019-12-03: 11:00:00 20 mg via ORAL

## 2019-12-03 MED ORDER — ESMOLOL HCL 100 MG/10ML IV SOLN
100 | INTRAVENOUS | Status: AC
Start: 2019-12-03 — End: 2019-12-03

## 2019-12-03 MED FILL — SENSORCAINE-MPF/EPINEPHRINE 0.5% -1:200000 IJ SOLN: INTRAMUSCULAR | Qty: 30

## 2019-12-03 MED FILL — ONDANSETRON HCL 4 MG/2ML IJ SOLN: 4 MG/2ML | INTRAMUSCULAR | Qty: 2

## 2019-12-03 MED FILL — XYLOCAINE-MPF 2 % IJ SOLN: 2 % | INTRAMUSCULAR | Qty: 5

## 2019-12-03 MED FILL — MIDAZOLAM HCL 2 MG/2ML IJ SOLN: 2 mg/mL | INTRAMUSCULAR | Qty: 2

## 2019-12-03 MED FILL — DIPRIVAN 200 MG/20ML IV EMUL: 200 MG/20ML | INTRAVENOUS | Qty: 20

## 2019-12-03 MED FILL — KETAMINE HCL 50 MG/5ML IJ SOSY: 50 MG/5ML | INTRAMUSCULAR | Qty: 5

## 2019-12-03 MED FILL — ESMOLOL HCL 100 MG/10ML IV SOLN: 100 MG/10ML | INTRAVENOUS | Qty: 10

## 2019-12-03 MED FILL — DEXAMETHASONE SOD PHOSPHATE PF 10 MG/ML IJ SOLN: 10 mg/mL | INTRAMUSCULAR | Qty: 1

## 2019-12-03 MED FILL — FAMOTIDINE 20 MG PO TABS: 20 mg | ORAL | Qty: 1

## 2019-12-03 MED FILL — APREPITANT 40 MG PO CAPS: 40 mg | ORAL | Qty: 1

## 2019-12-03 MED FILL — HYDROGEN PEROXIDE 3 % EX SOLN: 3 % | CUTANEOUS | Qty: 473

## 2019-12-03 MED FILL — CELECOXIB 400 MG PO CAPS: 400 mg | ORAL | Qty: 1

## 2019-12-03 MED FILL — DEXMEDETOMIDINE HCL 200 MCG/2ML IV SOLN: 200 MCG/2ML | INTRAVENOUS | Qty: 2

## 2019-12-03 MED FILL — CEFAZOLIN 2000 MG IN D5W 100 ML IVPB: Qty: 100

## 2019-12-03 NOTE — Progress Notes (Signed)
Pt denies pain or nausea, pt's family @ bedside

## 2019-12-03 NOTE — Anesthesia Post-Procedure Evaluation (Signed)
Department of Anesthesiology                             Post Anesthesia Note    Name: Connie Lyons MRN: [id number] DOB: 1970-06-21    (Age-49 y.o.)     This note was authored with review of the notes of the PACU and/or Same day stay and/or direct communication with the patient with an anesthesia care provider.     POST  ANESTHESIA  EVALUATION    No data found.     BP 130/70    Pulse 72    Temp 97.9 ??F (36.6 ??C) (Temporal)    Resp 16    Ht 5\' 1"  (1.549 m)    Wt 183 lb (83 kg)    SpO2 96%    BMI 34.58 kg/m??      Pain Level: 0    Vital signs: Respiratory and Cardiovascular function within normal limits (See Nursing Record)  Level of consciousness: Patient awake/ Able to participate      Return to baseline mental status: Yes Airway status: Normal   Pain controlled: Yes Dental injury: No   Nausea/Vomiting controlled: Yes Complications: no   Well hydrated: Yes      Patient Experience comments: None expressed    Electronically signed by , MD on 12/03/2019 at 2:23 PM

## 2019-12-03 NOTE — Progress Notes (Signed)
To phase 2 via cart     Report to barb rn

## 2019-12-03 NOTE — Op Note (Signed)
Pre-operative Diagnosis: Left 2nd toe, 3rd toe, 4th toe and 5th toe deformity    Post-operative Diagnosis: Same    Procedure: Left 2nd toe, 3rd toe, 4th toe and 5th toe percutaneous flexor tenotomy (both flexor tendons)    Components used: Not Applicable    Anesthesia: General    Surgeon: Clovis Fredrickson    Assistants: Pontasch    Estimated Blood Loss: Minimal    Complications: None    Specimens: No    Medications: Ancef 2 g IV preoperatively    Operative findings: Connie Lyons had flexion deformities of the 2nd toe, 3rd toe, 4th toe and 5th toe and following the percutaneous flexor tenotomy each toe was in a plantagrade position with the ankle dorsiflexed.    History of present illness: Connie Lyons is a 49 y.o. 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  Because of continued problems are related to the toe deformities Connie Lyons consented to undergo the above-mentioned procedure.    Operative report:    I met with Connie Lyons  in the preoperative area prior to the procedure and discussed the surgical plan once again and answered all of her questions related to the procedure and the expected post-operative course. The risks of surgery were discussed including but not limited to the risks of medications given for surgery, the risk of blood loss during and after surgery that can lead to the need for blood products in certain situations, infection, damage to normal structures that can lead to long term problems of pain or dysfunction, wound healing complications,   and late or chronic pain as a result of the surgical intervention.  In addition potentially life threatening complications that can occur at the time of surgery and after surgery were discussed including but not limited to deep vein thrombosis, pulmonary embolism, myocardial infarction, stroke and death.   I initialed her  Left lower extremity and signed  her  consent  form.    Connie Lyons was then brought to the operating room and placed in the supine position on the Operating Room table.  Care was taken to identify and pad all bony prominences.  A general anesthetic was then given by the anesthesia staff and an endotracheal tube was placed by the anesthesia staff.  At all times during the operative procedure the patient's head neck and airway were protected by the anesthesia staff.    The Left lower extremity was then prepped and draped in the usual orthopedic sterile fashion.  A surgical timeout was then performed with the patient's identification, the procedure to be performed being reviewed with the consent form, verification that the patient had received preoperative antibiotics, and verification of the correct surgical side.  Everyone in the operating room stopped what they were doing in order to participate in the timeout.  This timeout was performed by myself, the circulating room nurse and the anesthesia staff.  The patient's ASA was verified by the nurse circulator and the anesthesia staff.  Fire risk was assessed.    Percutaneous flexor tenotomies were then performed of the 2nd, 3rd, 4th and 5th toes in the following manner.  A 15 blade scalpel was placed along the medial aspect of each toe with the blade oriented in a distal direction.  The scalpel was placed just plantar to the proximal phalanx of the toe.  The scalpel was advanced through the skin and subcutaneous tissues and underneath the flexor  tendons on the plantar aspect of the proximal phalanx.  Once the blade was underneath the proximal phalanx it was rotated in a plantar direction and the toe was manually extended through the PIP and DIP joints thereby tensioning the flexor tendons against the plantarly oriented blade.  This divided the tendons and allowed for full extension of the PIP and DIP joints of the 2nd toe, 3rd toe, 4th toe and 5th toe.  The scalpel was then removed and the incision sites were  thoroughly irrigated with copious amounts of sterile saline.  Dermabond was applied to the incision sites.    Dry sterile dressings were then applied.  Connie Lyons was then awakened from her anesthetic, transferred to her hospital bed and taken to the recovery room in stable medical condition.    Post-operative plan:  Follow-up:  Connie Lyons will follow-up in 2 week(s) for her first post-operative check.    Suture removal:  her incision(s) was/were closed with buried suture and she will not need sutures removed    Shoe instructions:  At the first post-operative visit Connie Lyons will be allowed to try to fit into a regular shoe.         Weight bearing:  Connie Lyons will be instructed to be  weight bearing as tolerated on the Left lower extremity at the 2 week visit and this will be continued until the 6 week office visit.     Follow-up plan with Dr Clovis Fredrickson after 2 week visit:  If Connie Lyons is doing well and has been able to resume her normal activities, she does not need to see Dr Clovis Fredrickson for a follow up visit and should be instructed to call if things change and she wants to be seen

## 2019-12-03 NOTE — Progress Notes (Signed)
Tolerated clear liquid well

## 2019-12-03 NOTE — Discharge Instructions (Signed)

## 2019-12-03 NOTE — Interval H&P Note (Signed)
Interval History and Physical    I have interviewed and examined the patient and reviewed the recent History and Physical.  There have been no changes to the recent H&P documentation.    The patient understands the planned operation and its associated risks and benefits and agrees to proceed.    The surgical consent form has been signed.    BP 130/70    Pulse 72    Temp 97.9 ??F (36.6 ??C) (Temporal)    Resp 16    Ht 5\' 1"  (1.549 m)    Wt 183 lb (83 kg)    SpO2 96%    BMI 34.58 kg/m??      Impression: Left toe hammer deformity     Plan:  Proceed with planned surgical procedure on the Left foot.    Electronically signed by , MD on 12/03/19 at 3:48 PM EDT

## 2019-12-03 NOTE — Discharge Instructions (Signed)
Weight-bearing and post-operative activity    Dr Azell Der wants you to be  weight bearing as tolerated on the Left lower extremity.    If you have any problems maintaining this weight bearing status please call the office so that appropriate instructions can be given.      Dr. Azell Der wants you to be as active as your comfort will allow.  Remember that when you get up it is normal for your foot/toes to change color and for you to feel fluid, pressure or throbbing (your heart beat) in your foot or toes.  When you return to sitting down or laying down elevate your foot as it will lessen your swelling and be more comfortable.  If you are having a hard time moving around during the day, let Dr Azell Der know.

## 2019-12-03 NOTE — Progress Notes (Signed)
Disch inst to pt & family, both verbalize understanding, disch to home via w/c to family in waiting vehicle with valuables

## 2019-12-03 NOTE — Discharge Instructions (Signed)
If you have any questions please call Dr Holly Bodily office [phone]), all calls after 4:30 pm or anytime on Saturday or Sunday go through the answering service.  The nurse at the answering service will triage your call to determine if the at night on-call physician needs to be called (this may not be Dr Clovis Fredrickson but one of his partners if Dr Clovis Fredrickson is not on call).  Any calls regarding medication refills will be handled by the office during usual business hours Monday-Friday.  If you call for medication refills after hours or on the weekend you will have to wait until the next business day.    BANDAGE INSTRUCTIONS:  Keep your bandage clean and dry.  Do not put anything down under the dressings to scratch.  If your bandage gets wet or starts to feel uncomfortable call Dr Holly Bodily office immediately [phone]).  Leave the dressing in place, you do not need to change it unless you are instructed to do so by Dr. Clovis Fredrickson.  If there are problems with the dressing (it feels to tight, too loose, it gets wet) call Dr. Holly Bodily office.  If you see any blood on the bandage reinforce it on the surface with gauze and an ace bandage and call Dr Holly Bodily office.  Please call if you have any questions concerning your bandage/splint.    ACTIVITY:  The first 2 weeks after surgery is the most important time for healing.  Please take this time to rest and give yourself the best chance to have a good outcome.  Doing too much can affect your healing.  If you are not sure how active you can be please call Dr Clovis Fredrickson.    You should get up and move around once or twice an hour based on your comfort level.  You are not to be at bed rest meaning you need to get up and move around in order to prevent blood clots.    It is OK to wiggle your toes and you can contract your calf muscle as well.    When you put your foot down it is normal for the toes to turn a bluish or purple color.  Once you elevate the foot for a few minutes a normal pink color  should return underneath the toenails.  If the color does not change within 10 minutes call Dr. Holly Bodily office.    Keep your foot/ankle elevated for comfort.  Elevation is the best way to alleviate pain and decrease your swelling (swelling causes pain by itself).  Elevation means keeping the toes at the level of your nose.  If your leg is not elevated to that height then it is not truly elevated.    MEDICATIONS  You have been given a prescription for a narcotic medication that is intended for postoperative pain control.  This medication should be used judiciously to try and minimize your discomfort after surgery.  The medication will not relieve all of your pain and you should not take large amounts of the medication in an attempt to be pain free.  Please understand that narcotic medications can be addictive and they are intended to decrease but not eliminate your pain and should therefore be used in moderation.  If you have questions regarding taking the narcotic medication and other medications that you are currently on please call the office.  You cannot drive or operate machinery while taking the narcotic medication.  You should not drink alcohol or use recreational drugs while taking the narcotic  medication.The state of [place] restricts the amount of pain pills that can be legally prescribed and no more than one prescription in a week can be given.  If you require a refill of the pain medication it requires that someone come to the office in person as it cannot be called in to the pharmacy or E-prescribed.  Please understand that there is a limit to the total amount of pain medication that can be prescribed so make every effort to take it only when needed.  Unless otherwise instructed by Dr Azell Der you can take an over the counter anti-inflammatory to help with your pain as well.  This includes but is not limited to Ibuprofen, Advil, Motrin, Alleve, etc.    Continue taking daily aspirin as prescribed.             WEIGHTBEARING INSTRUCTIONS  Dr Azell Der wants you to be  weight bearing as tolerated on the Left lower extremity.    If you have any problems maintaining this weight bearing status please call the office so that appropriate instructions can be given.     FREQUENTLY ASKED QUESTIONS  If I am supposed to be non-weight bearing on the operative foot/ankle can I still rest the foot on the ground when I am sitting?   Yes, it is OK to rest your operative foot on the ground when you are sitting.     Is it normal to feel a rush of fluid or pressure in my foot/ankle when I put it down?   Yes, after most foot, ankle or leg surgeries it is very common to feel immediate swelling or pressure in your foot, ankle and leg.    Is it OK to put ice on my foot/ankle to help with the swelling and pain?   After foot/ankle surgery elevating the foot/ankle is much better at relieving pain and swelling than ice.  In many cases you bandage prevents the cold from getting to your   skin.  At your 2 week visit when your bandage is removed, icing the foot/ankle works much better and you can start it then.    If I received a nerve block before surgery, how long will it last?   A single shot nerve block can last anywhere from 8 hours to 36 hours on average, some patients' single shot blocks stop sooner and some can go a little longer.    A catheter block (pain ball) lasts longer when you have it dialed down and it runs out faster if you have it dialed up.  For most patients it lasts for a day and a half to 3 days.    Can I shower or bathe after surgery?   Yes, you can shower or bathe after surgery but you have to put a plastic bag over your splint/dressing to keep the splint/bandage absolutely dry.  The splint/bandage is   like a sponge and any water that gets in can damage your skin or cause your incision/incisions to open up and get infected.  If your splint/bandage gets water in it, call Dr.   Lelan Pons office [phone]) immediately and you  will be instructed which office can see your the quickest to change your splint/bandage.

## 2019-12-17 NOTE — Telephone Encounter (Signed)
Name of Caller: Connie Lyons     Relation to patient: patient    Contact Phone Number: [phone]    Appointment Scheduled with: Hom    Appointment Date & Time: 04/01/20 10:00a    Reason for visit (are you having any symptoms) : New patient New PCP    Transportation Issues/ concerns:       Special Accommodations? ( wheel chair, etc) :        Current medications:       Any refills need?:       Any chronic conditions you would like the physician to know of?:

## 2019-12-18 ENCOUNTER — Encounter: Attending: Physician Assistant | Primary: Internal Medicine

## 2019-12-30 ENCOUNTER — Ambulatory Visit: Admit: 2019-12-30 | Discharge: 2019-12-30 | Payer: MEDICARE | Attending: Neurology | Primary: Internal Medicine

## 2019-12-30 DIAGNOSIS — G43719 Chronic migraine without aura, intractable, without status migrainosus: Secondary | ICD-10-CM

## 2019-12-30 MED ORDER — AMITRIPTYLINE HCL 25 MG PO TABS
25 MG | ORAL_TABLET | Freq: Every evening | ORAL | 3 refills | Status: AC
Start: 2019-12-30 — End: ?

## 2019-12-30 MED ORDER — OMEPRAZOLE 20 MG PO CPDR
20 MG | ORAL_CAPSULE | Freq: Every day | ORAL | 3 refills | Status: AC
Start: 2019-12-30 — End: ?

## 2019-12-30 MED ORDER — ONABOTULINUMTOXINA 100 UNITS IJ SOLR
100 units | Freq: Once | INTRAMUSCULAR | Status: AC
Start: 2019-12-30 — End: 2019-12-30
  Administered 2019-12-30: 17:00:00 600 [IU] via INTRAMUSCULAR

## 2019-12-30 MED ORDER — VENLAFAXINE HCL ER 150 MG PO CP24
150 MG | ORAL_CAPSULE | Freq: Every day | ORAL | 3 refills | Status: AC
Start: 2019-12-30 — End: ?

## 2019-12-30 MED ORDER — ATORVASTATIN CALCIUM 20 MG PO TABS
20 MG | ORAL_TABLET | Freq: Every evening | ORAL | 3 refills | Status: AC
Start: 2019-12-30 — End: ?

## 2019-12-30 MED ORDER — CYCLOBENZAPRINE HCL 10 MG PO TABS
10 MG | ORAL_TABLET | Freq: Three times a day (TID) | ORAL | 3 refills | Status: AC | PRN
Start: 2019-12-30 — End: ?

## 2019-12-30 NOTE — Progress Notes (Signed)
DEPARTMENT OF NEUROLOGY    BOTOX PROCEDURE NOTE FOR HEADACHE AND                                           SPASTICITY    Date: 12/30/2019    Patient: Naila Elizondo      DOB: 1970/08/19     Patient is a return visit to the neurology clinic.  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  Today patient presented to the neurology clinic to resume her treatment with Botox injections.  For the last 10 years patient had received treatment for chronic migraine with  Botox in the scalp and posterior cervical muscles.  In addition patient also received injections in the left upper extremity to relieve her spasticity.  Until now patient has been receiving Botox injections at the [facility].  Now patient is transferring care here at [facility] neurology clinic.    Diagnosis: No primary diagnosis found.     Procedure: Botox injections into the scalp and posterior cervical muscles    Prior to procedure risks, benefits, alternatives, and potential side effects were reviewed with patient. Specifically reviewed possible risk of temporary muscle weakness. All questions were answered, patient expressed understanding, verbal consent given for procedure.  No numbing spray applied/removed.     Botox was injected with the parameters below:     With the patient in sitting position, she received Botox injections in the neck and skull muscles. Patient receive the following doses:         1. R Frontalis 10 units       2. L Frontalis  10 units       3. R Corrugator 10 units       4. L Corrugator 10 units       5. R Temporalis 30 units       6. L Temporalis 30  units       7. R Occipitalis  30 units       8. L Occipitalis  30 units       9. R Trapezious 20 units       10.L Trapezious 20 units          Total units injected 200 units. Units discarded 0 units.           Procedure #2: Botox injections into the left upper extremity.    Diagnosis: Left spastic  hemiparesis.    With the patient in supine position patient received Botox injections into the left upper extremity.  All the injection were performed under EMG monitoring.  Patient received the following doses.    1.  Left pectoralis 50 units at 2 injection sites.  2.  Left biceps 75 units.  3.  Left flexor carpi ulnaris 50 units  4.  Left flexor therapy radialis 50 units  5.  Left flexor digitorum profundus 50 units  6.  Left flexor digitorum sublimis 50 units.  7.  Left flexor pollicis longus 25 units  8.  Left flexor pollicis brevis 25 units.  9.  Left flexor digitorum brevis 25 units.    Total doses administered was 400 units, units discarded were 0.  No complication were observed after the procedure.  Patient will return to the neurology clinic in 6 weeks for further evaluation thank you.  Beverlyn Roux, MA

## 2019-12-30 NOTE — Progress Notes (Signed)
Administrations This Visit     onabotulinumtoxin A (BOTOX) injection 600 Units     Admin Date  12/30/2019  12:14 Action  Given Dose  600 Units Route  IntraMUSCular Site  Other Administered By  Shon Millet, MA    Ordering Provider: Emily Filbert, APRN - CNP    NDC: [phone], [phone], [phone]    Lot#: [id number], [id number], [id number]    Manufacturer: Denzil Hughes, [employer]ase [employer]    Patient Supplied?: Yes

## 2019-12-30 NOTE — Telephone Encounter (Signed)
Patient requested a refill on her maintenance meds to have covered by our [employer] while she transitions over to a PCP in network.      Medications are pended below to bridge the gap until her promary takes over in February.    Thanks!

## 2019-12-30 NOTE — Progress Notes (Signed)
error 

## 2020-01-14 NOTE — Telephone Encounter (Signed)
Name of Caller: Connie Lyons    Contact phone number: [phone]    Relationship to Patient: patient    Provider: Hom     Practice:  [facility]    Chief Complaint/Reason for Call: Had to reschedule pt appt from 04/01/20 to 05/25/20 @ 1:00p. Her original new patient appt was scheduled on the weight management schedule. Called pt and left VM about schedule change. Told her to call back if there were any questions     Best time of day caller can be reached:        Patient advised that office/PCP has 24-48 business hours to return their call:

## 2020-03-23 ENCOUNTER — Encounter: Attending: Neurology | Primary: Internal Medicine

## 2020-03-26 ENCOUNTER — Encounter: Attending: Neurology | Primary: Internal Medicine

## 2020-03-31 ENCOUNTER — Ambulatory Visit: Admit: 2020-03-31 | Discharge: 2020-03-31 | Payer: MEDICARE | Attending: Neurology | Primary: Internal Medicine

## 2020-03-31 DIAGNOSIS — G8114 Spastic hemiplegia affecting left nondominant side: Secondary | ICD-10-CM

## 2020-03-31 MED ORDER — ONABOTULINUMTOXINA 100 UNITS IJ SOLR
100 units | Freq: Once | INTRAMUSCULAR | Status: AC
Start: 2020-03-31 — End: 2020-03-31
  Administered 2020-03-31: 19:00:00 via INTRAMUSCULAR

## 2020-03-31 NOTE — Progress Notes (Signed)
Administrations This Visit     onabotulinumtoxin A (BOTOX) injection 600 Units     Admin Date  03/31/2020  13:35 Action  Given Dose  600 Units Route  IntraMUSCular Site  Other Administered By  Darcel Bayley, MA    Ordering Provider: Emily Filbert, APRN - CNP    NDC: [phone], [phone], [phone]    Lot#: [id number], [id number], [id number]    Manufacturer: Denzil Hughes, [employer]    Patient Supplied?: Yes

## 2020-03-31 NOTE — Progress Notes (Signed)
DEPARTMENT OF NEUROLOGY    BOTOX PROCEDURE NOTE FOR HEADACHE AND                                           SPASTICITY    Date: 03/31/2020    Patient: Connie Lyons      DOB: 09-13-1970     Patient is a return visit to the neurology clinic.  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  Today patient presented to the neurology clinic to resume her treatment with Botox injections.  For the last 10 years patient had received treatment for chronic migraine with  Botox in the scalp and posterior cervical muscles.  In addition patient also received injections in the left upper extremity to relieve her spasticity.  Until now patient has been receiving Botox injections at the [facility].  Now patient is transferring care here at [facility] neurology clinic.    Diagnosis: Left spastic hemiparesis (HCC) [G81.14]     Procedure: Botox injections into the scalp and posterior cervical muscles    Prior to procedure risks, benefits, alternatives, and potential side effects were reviewed with patient. Specifically reviewed possible risk of temporary muscle weakness. All questions were answered, patient expressed understanding, verbal consent given for procedure.  No numbing spray applied/removed.     Botox was injected with the parameters below:     With the patient in sitting position, she received Botox injections in the neck and skull muscles. Patient receive the following doses:         1. R Frontalis 10 units       2. L Frontalis  10 units       3. R Corrugator 10 units       4. L Corrugator 10 units       5. R Temporalis 30 units       6. L Temporalis 30  units       7. R Occipitalis  30 units       8. L Occipitalis  30 units       9. R Trapezious 20 units       10.L Trapezious 20 units          Total units injected 200 units. Units discarded 0 units.           Procedure #2: Botox injections into the left upper extremity.    Diagnosis:  Left spastic hemiparesis.    With the patient in supine position patient received Botox injections into the left upper extremity.  All the injection were performed under EMG monitoring.  Patient received the following doses.    1.  Left pectoralis 50 units at 2 injection sites.  2.  Left biceps 75 units.  3.  Left flexor carpi ulnaris 50 units  4.  Left flexor therapy radialis 50 units  5.  Left flexor digitorum profundus 50 units  6.  Left flexor digitorum sublimis 50 units.  7.  Left flexor pollicis longus 25 units  8.  Left flexor pollicis brevis 25 units.  9.  Left flexor digitorum brevis 25 units.    Total doses administered was 400 units, units discarded were 0.  No complication were observed after the procedure.  Patient will return to the neurology clinic in 6 weeks for further evaluation thank you.  Kate Sable, MD

## 2020-04-01 ENCOUNTER — Encounter: Attending: Internal Medicine | Primary: Internal Medicine

## 2020-05-08 MED ORDER — NURTEC 75 MG PO TBDP
75 MG | ORAL_TABLET | ORAL | 5 refills | Status: DC | PRN
Start: 2020-05-08 — End: 2020-11-03

## 2020-05-08 NOTE — Telephone Encounter (Signed)
Requested Prescriptions     Signed Prescriptions Disp Refills   ??? Rimegepant Sulfate (NURTEC) 75 MG TBDP 16 tablet 5     Sig: Take 1 tablet by mouth every 48 hours as needed (migraine)     Authorizing Provider: Lateka Rady

## 2020-05-08 NOTE — Telephone Encounter (Signed)
Please send refill for nurtec to [facility], rx pended for you  thanks!

## 2020-05-25 ENCOUNTER — Ambulatory Visit: Admit: 2020-05-25 | Discharge: 2020-05-25 | Payer: MEDICARE | Attending: Internal Medicine | Primary: Internal Medicine

## 2020-05-25 DIAGNOSIS — G43709 Chronic migraine without aura, not intractable, without status migrainosus: Secondary | ICD-10-CM

## 2020-05-25 NOTE — Progress Notes (Signed)
Marland Kitchen   [facility]  [address]  Connie Lyons  [place] [address]  Dept: [phone]  Dept Fax: [phone]    Visit type: New patient    Reason for Visit: Established New Doctor         Assessment and Plan       1. Chronic migraine w/o aura w/o status migrainosus, not intractable  2. Left spastic hemiparesis (HCC)  -     CBC; Future  -     Comprehensive Metabolic Panel; Future  3. Current mild episode of major depressive disorder without prior episode (HCC)  4. Mixed hyperlipidemia  -     Lipid Panel; Future  5. Hyperglycemia  -     Hemoglobin A1C; Future      -Basic labs ordered.  -Migraines stable with botox and Amitriptyline/Nurtec.  -Depression controlled. Continue Effexor.   -UTD on age appropriate screenings.   -No need for Shingrix, never had chickenpox.     Return in about 6 months (around 11/24/2020) for HPL, migraine, mood f/u. Marland Kitchen       Subjective       HPI   -Patient here to establish care. Here with her husband today.  -Follows with neurology for chronic migraines. Currently getting Botox injections every 3 months. On Nurtec and Amitriptyline.   -Hx of stroke at age 50 and L spastic hemiparesis. Also receives botox into her L arm. On ASA. Follows with vascular at [facility]. Hx of EDS and fibromuscular dysplasia.   -Hx of depression/anxiety. On Effexor.   -Hx of HTN/HPL. On Metoprolol and Atorvastatin.   -Hx of asthma. On Albuterol prn.   -Prevention: Colonoscopy was in 2014. Clean. Normal mammogram this last year. Never had chicken pox.     Review of Systems   Constitutional: Negative for chills and fever.   Respiratory: Negative for shortness of breath and wheezing.    Cardiovascular: Negative for chest pain.   Gastrointestinal: Negative for abdominal pain, constipation, diarrhea, nausea and vomiting.   Genitourinary: Negative for dysuria.   Neurological: Negative for dizziness.        Allergies   Allergen Reactions   . Cymbalta [Duloxetine Hcl]    . Dilaudid  [Hydromorphone Hcl]    . Fish-Derived Products    . Tylenol [Acetaminophen] Hives   . Zofran [Ondansetron Hcl] Nausea And Vomiting       Outpatient Medications Prior to Visit   Medication Sig Dispense Refill   . albuterol sulfate HFA 108 (90 Base) MCG/ACT inhaler INHALE 2 PUFFS BY MOUTH AS DIRECTED AS NEEDED     . Rimegepant Sulfate (NURTEC) 75 MG TBDP Take 1 tablet by mouth every 48 hours as needed (migraine) 16 tablet 5   . cyclobenzaprine (FLEXERIL) 10 MG tablet Take 1 tablet by mouth 3 times daily as needed for Muscle spasms 270 tablet 3   . venlafaxine (EFFEXOR XR) 150 MG extended release capsule Take 1 capsule by mouth every morning (before breakfast) 90 capsule 3   . omeprazole (PRILOSEC) 20 MG delayed release capsule Take 1 capsule by mouth every morning (before breakfast) 90 capsule 3   . amitriptyline (ELAVIL) 25 MG tablet Take 1 tablet by mouth nightly 90 tablet 3   . atorvastatin (LIPITOR) 20 MG tablet Take 1 tablet by mouth nightly 90 tablet 3   . Onabotulinumtoxin A (BOTOX) 200 units injection Inject 600 units into the muscle of head, neck and arm q 3 months ICD10 -  I69.354 600 Units 3   . magnesium 30 MG tablet Take 250 mg by mouth nightly     . albuterol sulfate HFA (VENTOLIN HFA) 108 (90 Base) MCG/ACT inhaler Inhale 2 puffs into the lungs every 6 hours as needed for Wheezing     . aspirin 81 MG chewable tablet Take 81 mg by mouth every morning (before breakfast)     . metoprolol succinate (TOPROL XL) 25 MG extended release tablet Take 25 mg by mouth nightly     . dicyclomine (BENTYL) 20 MG tablet Take 20 mg by mouth every 4 hours as needed     . ascorbic acid (VITAMIN C) 1000 MG tablet Take 1,000 mg by mouth daily     . butalbital-acetaminophen-caffeine (FIORICET, ESGIC) 50-325-40 MG per tablet Take by mouth       No facility-administered medications prior to visit.        Past Medical History:   Diagnosis Date   . Acquired deformities of toe, left     SCHEDULED  FOR THE SURGERY ON 12/03/19   .  Asthma    . Cerebral artery occlusion with cerebral infarction (HCC)     2012  LEFT  SIDED WEAKNESS   . Current mild episode of major depressive disorder (HCC)    . EDS (Ehlers-Danlos syndrome)    . Former smoker    . Hx of blood clots     LEFT CALF - 2012    . Hyperlipidemia    . Marijuana user    . PONV (postoperative nausea and vomiting)     NAUSEA        Social History     Tobacco Use   . Smoking status: Former Games developer   . Smokeless tobacco: Never Used   Substance Use Topics   . Alcohol use: Never     Comment: SELDOM        Past Surgical History:   Procedure Laterality Date   . APPENDECTOMY     . BREAST SURGERY      IMPLANTS   . COLONOSCOPY     . ENDOSCOPY, COLON, DIAGNOSTIC     . HYSTERECTOMY     . TOE SURGERY Left 12/03/2019    tentomy   . WISDOM TOOTH EXTRACTION         History reviewed. No pertinent family history.    Objective       BP 101/61 (Site: Right Upper Arm, Position: Sitting, Cuff Size: Large Adult)   Pulse 106   Temp 97.5 F (36.4 C) (Temporal)   Resp 15   Ht 5\' 1"  (1.549 m)   Wt 180 lb (81.6 kg)   BMI 34.01 kg/m   Physical Exam  Constitutional:       Appearance: Normal appearance. She is well-developed.   HENT:      Head: Normocephalic and atraumatic.      Mouth/Throat:      Pharynx: No oropharyngeal exudate.   Eyes:      General: No scleral icterus.     Conjunctiva/sclera: Conjunctivae normal.      Pupils: Pupils are equal, round, and reactive to light.   Neck:      Thyroid: No thyromegaly.      Vascular: No carotid bruit or JVD.   Cardiovascular:      Rate and Rhythm: Normal rate and regular rhythm.      Heart sounds: Normal heart sounds. No murmur heard.      Pulmonary:      Effort:  Pulmonary effort is normal.      Breath sounds: Normal breath sounds. No wheezing or rales.   Abdominal:      General: Bowel sounds are normal.      Palpations: Abdomen is soft.      Tenderness: There is no abdominal tenderness. There is no guarding or rebound.   Musculoskeletal:         General: Normal  range of motion.   Lymphadenopathy:      Cervical: No cervical adenopathy.   Skin:     General: Skin is warm and dry.   Neurological:      Mental Status: She is alert and oriented to person, place, and time.      Cranial Nerves: No cranial nerve deficit.           Data Reviewed and Summarized       Labs:     Imaging/Testing:        Jone Baseman, MD

## 2020-05-25 NOTE — Progress Notes (Signed)
[facility] Clinical Documentation  ??  This patient has previously been diagnosed with the following HCC Dx:    G81.14: ??Left spastic hemiparesis   ??  Please status these condition and the treatment plan.

## 2020-06-24 NOTE — Telephone Encounter (Signed)
The patient has upcoming Botox appt on 07/15/2020. The Botox will be supplied by [place] at [name] and delivered to the MD office on 07/07/2020.    BOTOX IS PATIENT SUPPLIED!!!

## 2020-07-01 ENCOUNTER — Encounter: Attending: Neurology | Primary: Internal Medicine

## 2020-07-15 ENCOUNTER — Encounter: Admit: 2020-07-15 | Discharge: 2020-07-15 | Payer: MEDICARE | Attending: Neurology | Primary: Internal Medicine

## 2020-07-15 DIAGNOSIS — G43719 Chronic migraine without aura, intractable, without status migrainosus: Secondary | ICD-10-CM

## 2020-07-15 MED ORDER — ONABOTULINUMTOXINA 100 UNITS IJ SOLR
100 units | Freq: Once | INTRAMUSCULAR | Status: AC
Start: 2020-07-15 — End: 2020-07-15
  Administered 2020-07-15: 19:00:00 via INTRAMUSCULAR

## 2020-07-15 NOTE — Progress Notes (Signed)
Administrations This Visit     onabotulinumtoxin A (BOTOX) injection 600 Units     Admin Date  07/15/2020  15:09 Action  Given Dose  600 Units Route  IntraMUSCular Site  Other Administered By  Rushie Nyhan, RN    Ordering Provider: Emily Filbert, APRN - CNP    NDC: [phone]    Lot#: [id number]    Manufacturer: Blase Mess    Patient Supplied?: Yes    Comments: See notes for administration sites.

## 2020-07-15 NOTE — Progress Notes (Signed)
DEPARTMENT OF NEUROLOGY    BOTOX PROCEDURE NOTE FOR HEADACHE AND                                           SPASTICITY    Date: 07/15/2020    Patient: Connie Lyons      DOB: March 06, 1970     Patient is a return visit to the neurology clinic.  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      Diagnosis: Chronic migraine without aura, intractable, without status migrainosus [G43.719]     Procedure: Botox injections into the scalp and posterior cervical muscles    Prior to procedure risks, benefits, alternatives, and potential side effects were reviewed with patient. Specifically reviewed possible risk of temporary muscle weakness. All questions were answered, patient expressed understanding, verbal consent given for procedure.  No numbing spray applied/removed.     Botox was injected with the parameters below:     With the patient in sitting position, she received Botox injections in the neck and skull muscles. Patient receive the following doses:         1. R Frontalis 10 units       2. L Frontalis  10 units       3. R Corrugator 10 units       4. L Corrugator 10 units       5. R Temporalis 30 units       6. L Temporalis 30  units       7. R Occipitalis  30 units       8. L Occipitalis  30 units       9. R Trapezious 20 units       10.L Trapezious 20 units          Total units injected 200 units. Units discarded 0 units.           Procedure #2: Botox injections into the left upper extremity.    Diagnosis: Left spastic hemiparesis.    With the patient in supine position patient received Botox injections into the left upper extremity.  All the injection were performed under EMG monitoring.  Patient received the following doses.    1.  Left pectoralis 50 units at 2 injection sites.  2.  Left biceps 75 units.  3.  Left flexor carpi ulnaris 50 units  4.  Left flexor therapy radialis 50 units  5.  Left flexor digitorum profundus 50 units  6.  Left  flexor digitorum sublimis 50 units.  7.  Left flexor pollicis longus 25 units  8.  Left flexor pollicis brevis 25 units.  9.  Left flexor digitorum brevis 25 units.    Total doses administered was 400 units, units discarded were 0.  No complication were observed after the procedure.  Patient will return to the neurology clinic in 6 weeks for further evaluation thank you.        Windell Norfolk, MD

## 2020-10-14 ENCOUNTER — Encounter: Attending: Neurology | Primary: Internal Medicine

## 2020-10-23 ENCOUNTER — Encounter

## 2020-10-23 LAB — CBC
Hematocrit: 38.7 % (ref 35.0–47.0)
Hemoglobin: 13.1 g/dL (ref 11.7–16.0)
MCH: 30.3 pg (ref 26.0–34.0)
MCHC: 34 % (ref 32.0–36.0)
MCV: 89.3 fL (ref 79.0–98.0)
MPV: 9.9 fL (ref 7.4–12.4)
Platelets: 289 10*3/uL (ref 140–440)
RBC: 4.34 10*6/uL (ref 3.80–5.20)
RDW: 13.6 % (ref 11.5–14.5)
WBC: 7.5 10*3/uL (ref 3.6–10.7)

## 2020-10-23 LAB — COMPREHENSIVE METABOLIC PANEL
ALT: 20 U/L (ref 0–34)
AST: 33 U/L (ref 15–46)
Albumin,Serum: 4 g/dL (ref 3.5–5.0)
Alkaline Phosphatase: 106 U/L (ref 38–126)
Anion Gap: 7 mmol/L (ref 3–13)
BUN: 15 mg/dL (ref 9–20)
CO2: 25 mmol/L (ref 22–30)
Calcium: 9.4 mg/dL (ref 8.4–10.4)
Chloride: 104 mmol/L (ref 98–107)
Creatinine: 0.68 mg/dL (ref 0.52–1.25)
EGFR IF NonAfrican American: >90 mL/min (ref >60)
Glucose: 111 mg/dL — ABNORMAL HIGH (ref 70–100)
Potassium: 4.3 mmol/L (ref 3.5–5.1)
Sodium: 136 mmol/L (ref 135–145)
Total Bilirubin: 0.2 mg/dL (ref 0.2–1.3)
Total Protein: 6.9 g/dL (ref 6.3–8.2)
eGFR African American: >90 mL/min (ref >60)

## 2020-10-23 LAB — LIPID PANEL
Chol/HDL Ratio: 3 NA
Cholesterol: 180 mg/dL (ref <200)
HDL: 66 mg/dL — ABNORMAL HIGH (ref 40–60)
LDL Cholesterol: 75 mg/dL (ref <100)
Triglycerides: 195 mg/dL — AB (ref <150)

## 2020-10-23 LAB — HEMOGLOBIN A1C
Estimated Avg Glucose: 108 mg/dL
Hemoglobin A1C: 5.4 %

## 2020-11-03 MED ORDER — NURTEC 75 MG PO TBDP
75 MG | ORAL_TABLET | ORAL | 5 refills | Status: AC | PRN
Start: 2020-11-03 — End: ?

## 2020-11-03 NOTE — Telephone Encounter (Signed)
Requested Prescriptions     Signed Prescriptions Disp Refills    NURTEC 75 MG TBDP 16 tablet 5     Sig: TAKE 1 TABLET BY MOUTH EVERY 48 HOURS AS NEEDED (MIGRAINE)     Authorizing Provider: Emily Filbert

## 2020-11-26 ENCOUNTER — Encounter: Admit: 2020-11-26 | Discharge: 2020-11-26 | Payer: MEDICARE | Attending: Internal Medicine | Primary: Internal Medicine

## 2020-11-26 DIAGNOSIS — E782 Mixed hyperlipidemia: Secondary | ICD-10-CM

## 2020-11-26 MED ORDER — ROPINIROLE HCL 0.25 MG PO TABS
0.25 MG | ORAL_TABLET | Freq: Three times a day (TID) | ORAL | 3 refills | Status: AC
Start: 2020-11-26 — End: ?

## 2020-11-26 MED ORDER — PROMETHAZINE HCL 25 MG PO TABS
25 MG | ORAL_TABLET | Freq: Three times a day (TID) | ORAL | 1 refills | Status: AC | PRN
Start: 2020-11-26 — End: ?

## 2020-11-26 NOTE — Patient Instructions (Signed)
r 

## 2020-11-26 NOTE — Progress Notes (Signed)
Marland Kitchen   [facility]  [address]  [place]  [place] [address]  Dept: [phone]  Dept Fax: [phone]    Visit type: Established patient    Reason for Visit: Follow-up         Assessment and Plan       1. Mixed hyperlipidemia  2. Left spastic hemiparesis (HCC)  3. Chronic migraine w/o aura w/o status migrainosus, not intractable  -     promethazine (PHENERGAN) 25 MG tablet; Take 1 tablet by mouth every 8 hours as needed for Nausea, Disp-30 tablet, R-1Normal  4. Current mild episode of major depressive disorder without prior episode (HCC)  5. Muscle spasm of left lower extremity  -     rOPINIRole (REQUIP) 0.25 MG tablet; Take 1 tablet by mouth 3 times daily, Disp-90 tablet, R-3Normal    -Unfortunately neuropathic medication options will cause weight gain. Will trial Ropinirole to help with spasming. Recommend she d/w neuro to see if botox would be an option.  -Mood stable. Continue Effexor without change.  -Migraines also controlled.     Return in about 6 months (around 05/27/2021) for AWV. Marland Kitchen       Subjective       HPI   -Patient here for routine f/u.  -Depression stable.   -Migraines are controlled overall. Trying to stretch Botox out to every 6 months.   -She is having some contraction in her L leg. Worse at night. No help with Flexeril. She tried Gabapentin in the past but this caused a lot of weight gain. Tried Magnesium but this wasn't helpful.     Review of Systems   Constitutional:  Negative for chills and fever.   Respiratory:  Negative for shortness of breath and wheezing.    Cardiovascular:  Negative for chest pain.   Gastrointestinal:  Negative for nausea and vomiting.   Neurological:  Negative for dizziness.      Allergies   Allergen Reactions    Cymbalta [Duloxetine Hcl]     Dilaudid [Hydromorphone Hcl]     Fish-Derived Products     Tylenol [Acetaminophen] Hives    Zofran [Ondansetron Hcl] Nausea And Vomiting       Outpatient Medications Prior to Visit    Medication Sig Dispense Refill    NURTEC 75 MG TBDP TAKE 1 TABLET BY MOUTH EVERY 48 HOURS AS NEEDED (MIGRAINE) 16 tablet 5    ascorbic acid (VITAMIN C) 1000 MG tablet Take 1,000 mg by mouth daily      albuterol sulfate HFA 108 (90 Base) MCG/ACT inhaler INHALE 2 PUFFS BY MOUTH AS DIRECTED AS NEEDED      cyclobenzaprine (FLEXERIL) 10 MG tablet Take 1 tablet by mouth 3 times daily as needed for Muscle spasms 270 tablet 3    venlafaxine (EFFEXOR XR) 150 MG extended release capsule Take 1 capsule by mouth every morning (before breakfast) 90 capsule 3    omeprazole (PRILOSEC) 20 MG delayed release capsule Take 1 capsule by mouth every morning (before breakfast) 90 capsule 3    amitriptyline (ELAVIL) 25 MG tablet Take 1 tablet by mouth nightly 90 tablet 3    atorvastatin (LIPITOR) 20 MG tablet Take 1 tablet by mouth nightly 90 tablet 3    Onabotulinumtoxin A (BOTOX) 200 units injection Inject 600 units into the muscle of head, neck and arm q 3 months ICD10 -  I69.354 600 Units 3    magnesium 30 MG tablet Take 250  mg by mouth nightly      albuterol sulfate HFA (PROVENTIL;VENTOLIN;PROAIR) 108 (90 Base) MCG/ACT inhaler Inhale 2 puffs into the lungs every 6 hours as needed for Wheezing      aspirin 81 MG chewable tablet Take 81 mg by mouth every morning (before breakfast)      metoprolol succinate (TOPROL XL) 25 MG extended release tablet Take 25 mg by mouth nightly      dicyclomine (BENTYL) 20 MG tablet Take 20 mg by mouth every 4 hours as needed      promethazine (PHENERGAN) 25 MG tablet Take by mouth       No facility-administered medications prior to visit.        Past Medical History:   Diagnosis Date    Acquired deformities of toe, left     SCHEDULED  FOR THE SURGERY ON 12/03/19    Asthma     Cerebral artery occlusion with cerebral infarction (HCC)     2012  LEFT  SIDED WEAKNESS    Current mild episode of major depressive disorder (HCC)     EDS (Ehlers-Danlos syndrome)     Former smoker     Hx of blood clots     LEFT  CALF - 2012     Hyperlipidemia     Marijuana user     PONV (postoperative nausea and vomiting)     NAUSEA        Social History     Tobacco Use    Smoking status: Former    Smokeless tobacco: Never   Substance Use Topics    Alcohol use: Never     Comment: SELDOM        Past Surgical History:   Procedure Laterality Date    APPENDECTOMY      BREAST SURGERY      IMPLANTS    COLONOSCOPY      ENDOSCOPY, COLON, DIAGNOSTIC      HYSTERECTOMY (CERVIX STATUS UNKNOWN)      TOE SURGERY Left 12/03/2019    tentomy    WISDOM TOOTH EXTRACTION         No family history on file.    Objective       BP 127/83 (Site: Left Upper Arm, Position: Sitting, Cuff Size: Small Adult)   Pulse 97   Temp 97.7 F (36.5 C) (Temporal)   Resp 18   Ht 5\' 1"  (1.549 m)   Wt 185 lb 14.4 oz (84.3 kg)   BMI 35.13 kg/m   Physical Exam  Constitutional:       Appearance: Normal appearance. She is well-developed.   HENT:      Nose: Nose normal.      Mouth/Throat:      Pharynx: No oropharyngeal exudate.   Eyes:      Conjunctiva/sclera: Conjunctivae normal.      Pupils: Pupils are equal, round, and reactive to light.   Neck:      Thyroid: No thyromegaly.      Vascular: No carotid bruit or JVD.   Cardiovascular:      Rate and Rhythm: Normal rate and regular rhythm.      Heart sounds: Normal heart sounds. No murmur heard.  Pulmonary:      Effort: Pulmonary effort is normal.      Breath sounds: Normal breath sounds. No wheezing or rales.   Abdominal:      General: Bowel sounds are normal.      Palpations: Abdomen is soft.  Tenderness: There is no abdominal tenderness.   Lymphadenopathy:      Cervical: No cervical adenopathy.   Skin:     General: Skin is warm and dry.      Findings: No rash.   Neurological:      Mental Status: She is alert and oriented to person, place, and time.         Data Reviewed and Summarized       Labs:     Imaging/Testing:        Wolfgang Phoenix, MD

## 2020-12-15 MED ORDER — ONABOTULINUMTOXINA 200 UNITS IJ SOLR
200 units | INTRAMUSCULAR | 2 refills | Status: AC
Start: 2020-12-15 — End: ?

## 2020-12-15 NOTE — Telephone Encounter (Signed)
Patient is scheduled for Botox 01/20/2021. Please approve pended prescription, medication is patient supplied

## 2020-12-15 NOTE — Telephone Encounter (Signed)
Requested Prescriptions     Signed Prescriptions Disp Refills    Onabotulinumtoxin A (BOTOX) 200 units injection 3 each 2     Sig: Inject 600 units into the muscle of head, neck and arm q 3 months ICD10 -  I50.122     Authorizing Provider: Emily Filbert

## 2021-01-20 ENCOUNTER — Encounter: Payer: MEDICARE | Attending: Neurology | Primary: Internal Medicine

## 2021-05-31 ENCOUNTER — Encounter: Payer: MEDICARE | Attending: Internal Medicine | Primary: Internal Medicine
